# Patient Record
Sex: Female | Born: 1935 | Race: White | Hispanic: No | Smoking: Never smoker
Health system: Southern US, Community
[De-identification: ages and names within clinical notes are randomized; demographics above are authoritative.]

## PROBLEM LIST (undated history)

## (undated) ENCOUNTER — Emergency Department (HOSPITAL_COMMUNITY): Admission: EM | Payer: 59 | Source: Home / Self Care

## (undated) DIAGNOSIS — R55 Syncope and collapse: Secondary | ICD-10-CM

## (undated) DIAGNOSIS — E875 Hyperkalemia: Secondary | ICD-10-CM

## (undated) DIAGNOSIS — R296 Repeated falls: Secondary | ICD-10-CM

## (undated) DIAGNOSIS — R7989 Other specified abnormal findings of blood chemistry: Secondary | ICD-10-CM

## (undated) DIAGNOSIS — F0390 Unspecified dementia without behavioral disturbance: Secondary | ICD-10-CM

## (undated) DIAGNOSIS — E119 Type 2 diabetes mellitus without complications: Secondary | ICD-10-CM

## (undated) DIAGNOSIS — K219 Gastro-esophageal reflux disease without esophagitis: Secondary | ICD-10-CM

## (undated) DIAGNOSIS — F09 Unspecified mental disorder due to known physiological condition: Secondary | ICD-10-CM

## (undated) DIAGNOSIS — R262 Difficulty in walking, not elsewhere classified: Secondary | ICD-10-CM

## (undated) DIAGNOSIS — I498 Other specified cardiac arrhythmias: Secondary | ICD-10-CM

## (undated) DIAGNOSIS — N179 Acute kidney failure, unspecified: Secondary | ICD-10-CM

## (undated) DIAGNOSIS — R1312 Dysphagia, oropharyngeal phase: Secondary | ICD-10-CM

## (undated) DIAGNOSIS — G309 Alzheimer's disease, unspecified: Secondary | ICD-10-CM

## (undated) DIAGNOSIS — I499 Cardiac arrhythmia, unspecified: Secondary | ICD-10-CM

## (undated) DIAGNOSIS — N390 Urinary tract infection, site not specified: Secondary | ICD-10-CM

## (undated) DIAGNOSIS — F0280 Dementia in other diseases classified elsewhere without behavioral disturbance: Secondary | ICD-10-CM

## (undated) DIAGNOSIS — I10 Essential (primary) hypertension: Secondary | ICD-10-CM

## (undated) DIAGNOSIS — D649 Anemia, unspecified: Secondary | ICD-10-CM

## (undated) HISTORY — DX: Other specified cardiac arrhythmias: I49.8

## (undated) HISTORY — DX: Dysphagia, oropharyngeal phase: R13.12

## (undated) HISTORY — PX: JOINT REPLACEMENT: SHX530

## (undated) HISTORY — DX: Unspecified dementia, unspecified severity, without behavioral disturbance, psychotic disturbance, mood disturbance, and anxiety: F03.90

## (undated) HISTORY — PX: CHOLECYSTECTOMY: SHX55

## (undated) HISTORY — PX: CARDIAC SURGERY: SHX584

## (undated) HISTORY — PX: ABDOMINAL HYSTERECTOMY: SHX81

## (undated) HISTORY — PX: APPENDECTOMY: SHX54

## (undated) HISTORY — DX: Gastro-esophageal reflux disease without esophagitis: K21.9

---

## 2007-03-16 ENCOUNTER — Encounter (HOSPITAL_COMMUNITY): Admission: RE | Admit: 2007-03-16 | Discharge: 2007-04-15 | Payer: Self-pay | Admitting: Internal Medicine

## 2007-04-20 ENCOUNTER — Encounter (HOSPITAL_COMMUNITY): Admission: RE | Admit: 2007-04-20 | Discharge: 2007-05-20 | Payer: Self-pay | Admitting: Internal Medicine

## 2007-06-28 ENCOUNTER — Encounter (HOSPITAL_COMMUNITY): Admission: RE | Admit: 2007-06-28 | Discharge: 2007-07-30 | Payer: Self-pay | Admitting: Internal Medicine

## 2007-10-01 ENCOUNTER — Emergency Department (HOSPITAL_COMMUNITY): Admission: EM | Admit: 2007-10-01 | Discharge: 2007-10-01 | Payer: Self-pay | Admitting: Emergency Medicine

## 2007-10-15 ENCOUNTER — Emergency Department (HOSPITAL_COMMUNITY): Admission: EM | Admit: 2007-10-15 | Discharge: 2007-10-16 | Payer: Self-pay | Admitting: Emergency Medicine

## 2007-12-20 ENCOUNTER — Emergency Department (HOSPITAL_COMMUNITY): Admission: EM | Admit: 2007-12-20 | Discharge: 2007-12-20 | Payer: Self-pay | Admitting: Emergency Medicine

## 2008-03-26 ENCOUNTER — Emergency Department (HOSPITAL_COMMUNITY): Admission: EM | Admit: 2008-03-26 | Discharge: 2008-03-26 | Payer: Self-pay | Admitting: Emergency Medicine

## 2009-02-12 ENCOUNTER — Ambulatory Visit: Payer: Self-pay | Admitting: Cardiology

## 2009-02-12 ENCOUNTER — Ambulatory Visit: Payer: Self-pay | Admitting: Orthopedic Surgery

## 2009-02-12 ENCOUNTER — Inpatient Hospital Stay (HOSPITAL_COMMUNITY): Admission: EM | Admit: 2009-02-12 | Discharge: 2009-02-26 | Payer: Self-pay | Admitting: Emergency Medicine

## 2009-02-13 ENCOUNTER — Encounter: Payer: Self-pay | Admitting: Cardiology

## 2009-02-13 ENCOUNTER — Encounter: Payer: Self-pay | Admitting: Orthopedic Surgery

## 2009-02-20 ENCOUNTER — Encounter: Payer: Self-pay | Admitting: Orthopedic Surgery

## 2009-02-26 ENCOUNTER — Inpatient Hospital Stay: Admission: AD | Admit: 2009-02-26 | Discharge: 2009-11-29 | Payer: Self-pay | Admitting: Internal Medicine

## 2009-03-02 ENCOUNTER — Telehealth: Payer: Self-pay | Admitting: Orthopedic Surgery

## 2009-04-02 ENCOUNTER — Telehealth: Payer: Self-pay | Admitting: Orthopedic Surgery

## 2009-04-02 ENCOUNTER — Ambulatory Visit: Payer: Self-pay | Admitting: Orthopedic Surgery

## 2009-04-06 ENCOUNTER — Telehealth: Payer: Self-pay | Admitting: Orthopedic Surgery

## 2009-05-01 ENCOUNTER — Ambulatory Visit (HOSPITAL_COMMUNITY): Admission: RE | Admit: 2009-05-01 | Discharge: 2009-05-01 | Payer: Self-pay | Admitting: Internal Medicine

## 2009-05-07 ENCOUNTER — Encounter: Payer: Self-pay | Admitting: Orthopedic Surgery

## 2009-05-14 ENCOUNTER — Ambulatory Visit: Payer: Self-pay | Admitting: Orthopedic Surgery

## 2009-08-01 ENCOUNTER — Encounter: Payer: Self-pay | Admitting: Orthopedic Surgery

## 2009-10-25 ENCOUNTER — Ambulatory Visit (HOSPITAL_COMMUNITY): Admission: RE | Admit: 2009-10-25 | Discharge: 2009-10-25 | Payer: Self-pay | Admitting: Internal Medicine

## 2009-11-29 ENCOUNTER — Inpatient Hospital Stay (HOSPITAL_COMMUNITY): Admission: EM | Admit: 2009-11-29 | Discharge: 2009-12-01 | Payer: Self-pay | Admitting: Emergency Medicine

## 2009-12-01 ENCOUNTER — Inpatient Hospital Stay
Admission: AD | Admit: 2009-12-01 | Discharge: 2011-01-21 | Disposition: A | Discharge status: Nursing Home (Includes ICF) | Payer: Medicare HMO | Attending: Internal Medicine | Admitting: Internal Medicine

## 2010-05-08 ENCOUNTER — Ambulatory Visit (HOSPITAL_COMMUNITY): Admission: RE | Admit: 2010-05-08 | Discharge: 2010-05-08 | Payer: Self-pay | Admitting: Internal Medicine

## 2010-06-12 LAB — GLUCOSE, CAPILLARY
Glucose-Capillary: 163 mg/dL — ABNORMAL HIGH (ref 70–99)
Glucose-Capillary: 139 mg/dL — ABNORMAL HIGH (ref 70–99)

## 2010-06-14 LAB — GLUCOSE, CAPILLARY: Glucose-Capillary: 196 mg/dL — ABNORMAL HIGH (ref 70–99)

## 2010-06-22 ENCOUNTER — Ambulatory Visit
Admission: AD | Admit: 2010-06-22 | Discharge: 2010-06-22 | Payer: Self-pay | Source: Home / Self Care | Attending: Obstetrics & Gynecology | Admitting: Obstetrics & Gynecology

## 2010-06-24 LAB — GLUCOSE, CAPILLARY
Glucose-Capillary: 186 mg/dL — ABNORMAL HIGH (ref 70–99)
Glucose-Capillary: 173 mg/dL — ABNORMAL HIGH (ref 70–99)
Glucose-Capillary: 172 mg/dL — ABNORMAL HIGH (ref 70–99)
Glucose-Capillary: 185 mg/dL — ABNORMAL HIGH (ref 70–99)
Glucose-Capillary: 206 mg/dL — ABNORMAL HIGH (ref 70–99)
Glucose-Capillary: 156 mg/dL — ABNORMAL HIGH (ref 70–99)
Glucose-Capillary: 152 mg/dL — ABNORMAL HIGH (ref 70–99)
Glucose-Capillary: 168 mg/dL — ABNORMAL HIGH (ref 70–99)

## 2010-06-26 LAB — GLUCOSE, CAPILLARY
Glucose-Capillary: 153 mg/dL — ABNORMAL HIGH (ref 70–99)
Glucose-Capillary: 179 mg/dL — ABNORMAL HIGH (ref 70–99)

## 2010-07-01 LAB — GLUCOSE, CAPILLARY: Glucose-Capillary: 178 mg/dL — ABNORMAL HIGH (ref 70–99)

## 2010-07-02 LAB — GLUCOSE, CAPILLARY: Glucose-Capillary: 149 mg/dL — ABNORMAL HIGH (ref 70–99)

## 2010-07-03 LAB — GLUCOSE, CAPILLARY: Glucose-Capillary: 154 mg/dL — ABNORMAL HIGH (ref 70–99)

## 2010-07-09 NOTE — Letter (Signed)
Summary: Medical record request [employer]  Medical record request [employer]   Imported By: Cammie Sickle 09/29/2009 10:59:54  _____________________________________________________________________  External Attachment:    Type:   Image     Comment:   External Document

## 2010-07-10 LAB — GLUCOSE, CAPILLARY: Glucose-Capillary: 159 mg/dL — ABNORMAL HIGH (ref 70–99)

## 2010-07-12 LAB — GLUCOSE, CAPILLARY: Glucose-Capillary: 142 mg/dL — ABNORMAL HIGH (ref 70–99)

## 2010-07-13 LAB — GLUCOSE, CAPILLARY: Glucose-Capillary: 168 mg/dL — ABNORMAL HIGH (ref 70–99)

## 2010-07-15 LAB — GLUCOSE, CAPILLARY
Glucose-Capillary: 145 mg/dL — ABNORMAL HIGH (ref 70–99)
Glucose-Capillary: 160 mg/dL — ABNORMAL HIGH (ref 70–99)

## 2010-07-23 ENCOUNTER — Ambulatory Visit (HOSPITAL_COMMUNITY)
Admission: EM | Admit: 2010-07-23 | Discharge: 2010-07-23 | Disposition: A | Discharge status: Home / Self Care | Payer: Medicare HMO | Source: Skilled Nursing Facility | Attending: Emergency Medicine | Admitting: Emergency Medicine

## 2010-07-23 ENCOUNTER — Inpatient Hospital Stay (HOSPITAL_COMMUNITY): Payer: Medicare HMO

## 2010-07-23 ENCOUNTER — Emergency Department (HOSPITAL_COMMUNITY): Admission: EM | Admit: 2010-07-23 | Payer: Medicare HMO | Source: Home / Self Care

## 2010-07-23 DIAGNOSIS — M25559 Pain in unspecified hip: Secondary | ICD-10-CM | POA: Insufficient documentation

## 2010-07-23 DIAGNOSIS — Z79899 Other long term (current) drug therapy: Secondary | ICD-10-CM | POA: Insufficient documentation

## 2010-07-23 DIAGNOSIS — Z7982 Long term (current) use of aspirin: Secondary | ICD-10-CM | POA: Insufficient documentation

## 2010-07-23 DIAGNOSIS — M7989 Other specified soft tissue disorders: Secondary | ICD-10-CM | POA: Insufficient documentation

## 2010-07-23 DIAGNOSIS — I10 Essential (primary) hypertension: Secondary | ICD-10-CM | POA: Insufficient documentation

## 2010-07-23 DIAGNOSIS — E119 Type 2 diabetes mellitus without complications: Secondary | ICD-10-CM | POA: Insufficient documentation

## 2010-07-23 LAB — DIFFERENTIAL
Basophils Absolute: 0.1 10*3/uL (ref 0.0–0.1)
Basophils Relative: 1 % (ref 0–1)
Eosinophils Relative: 8 % — ABNORMAL HIGH (ref 0–5)
Lymphocytes Relative: 21 % (ref 12–46)
Lymphs Abs: 1.9 10*3/uL (ref 0.7–4.0)
Monocytes Absolute: 0.8 10*3/uL (ref 0.1–1.0)

## 2010-07-23 LAB — BASIC METABOLIC PANEL
CO2: 23 mEq/L (ref 19–32)
Chloride: 104 mEq/L (ref 96–112)
Glucose, Bld: 178 mg/dL — ABNORMAL HIGH (ref 70–99)
Potassium: 4 mEq/L (ref 3.5–5.1)
Sodium: 135 mEq/L (ref 135–145)

## 2010-07-23 LAB — SEDIMENTATION RATE: Sed Rate: 43 mm/hr — ABNORMAL HIGH (ref 0–22)

## 2010-07-23 LAB — GLUCOSE, CAPILLARY: Glucose-Capillary: 158 mg/dL — ABNORMAL HIGH (ref 70–99)

## 2010-07-23 LAB — CBC
MCH: 31 pg (ref 26.0–34.0)
Platelets: 242 10*3/uL (ref 150–400)
RBC: 3.55 MIL/uL — ABNORMAL LOW (ref 3.87–5.11)
WBC: 9.1 10*3/uL (ref 4.0–10.5)

## 2010-07-23 MED ORDER — IOHEXOL 300 MG/ML  SOLN
100.0000 mL | Freq: Once | INTRAMUSCULAR | Status: AC | PRN
  Administered 2010-07-23: 100 mL via INTRAVENOUS

## 2010-07-24 LAB — GLUCOSE, CAPILLARY: Glucose-Capillary: 140 mg/dL — ABNORMAL HIGH (ref 70–99)

## 2010-07-29 LAB — GLUCOSE, CAPILLARY: Glucose-Capillary: 175 mg/dL — ABNORMAL HIGH (ref 70–99)

## 2010-07-30 ENCOUNTER — Telehealth: Payer: Self-pay | Admitting: Orthopedic Surgery

## 2010-07-31 LAB — GLUCOSE, CAPILLARY: Glucose-Capillary: 161 mg/dL — ABNORMAL HIGH (ref 70–99)

## 2010-08-01 ENCOUNTER — Ambulatory Visit: Payer: Medicare HMO | Admitting: Orthopedic Surgery

## 2010-08-02 LAB — GLUCOSE, CAPILLARY: Glucose-Capillary: 135 mg/dL — ABNORMAL HIGH (ref 70–99)

## 2010-08-04 LAB — GLUCOSE, CAPILLARY: Glucose-Capillary: 156 mg/dL — ABNORMAL HIGH (ref 70–99)

## 2010-08-06 NOTE — Progress Notes (Signed)
Summary: cancelling appointment, question about next appointment  Phone Note Call from Patient   Caller: Daughter Summary of Call:     Patient's daughter [name] [phone], called to cancel patient's appointment for 08/01/10, due to a separate medical issue - ulcer at bottom of foot (patient is diabetic). [facility] also called to cancel.       [name], daughter, asking if Dr Romeo Apple could possibly meet patient at [facility] to see her hip and to possibly do a cortisone injection if recommended - once patient's treatment for her foot ulcer is completed.   Please advise. Initial call taken by: Cammie Sickle,  July 30, 2010 2:55 PM  Follow-up for Phone Call        no comment  Follow-up by: Fuller Canada MD,  July 30, 2010 2:57 PM  Additional Follow-up for Phone Call Additional follow up Details #1::        called back to daughter [name].  Advised to call after all current treatment for her foot problem is completed, and to try to arrange to re-schedule for the hip for here in the office if possible, Vs. hosp visit, for the hip problem. Additional Follow-up by: Cammie Sickle,  August 01, 2010 11:56 AM

## 2010-08-07 LAB — GLUCOSE, CAPILLARY
Glucose-Capillary: 134 mg/dL — ABNORMAL HIGH (ref 70–99)
Glucose-Capillary: 169 mg/dL — ABNORMAL HIGH (ref 70–99)

## 2010-08-09 LAB — GLUCOSE, CAPILLARY: Glucose-Capillary: 106 mg/dL — ABNORMAL HIGH (ref 70–99)

## 2010-08-12 LAB — GLUCOSE, CAPILLARY: Glucose-Capillary: 156 mg/dL — ABNORMAL HIGH (ref 70–99)

## 2010-08-19 LAB — GLUCOSE, CAPILLARY
Glucose-Capillary: 140 mg/dL — ABNORMAL HIGH (ref 70–99)
Glucose-Capillary: 152 mg/dL — ABNORMAL HIGH (ref 70–99)
Glucose-Capillary: 156 mg/dL — ABNORMAL HIGH (ref 70–99)
Glucose-Capillary: 157 mg/dL — ABNORMAL HIGH (ref 70–99)
Glucose-Capillary: 157 mg/dL — ABNORMAL HIGH (ref 70–99)
Glucose-Capillary: 168 mg/dL — ABNORMAL HIGH (ref 70–99)
Glucose-Capillary: 189 mg/dL — ABNORMAL HIGH (ref 70–99)
Glucose-Capillary: 198 mg/dL — ABNORMAL HIGH (ref 70–99)
Glucose-Capillary: 202 mg/dL — ABNORMAL HIGH (ref 70–99)

## 2010-08-20 LAB — GLUCOSE, CAPILLARY
Glucose-Capillary: 114 mg/dL — ABNORMAL HIGH (ref 70–99)
Glucose-Capillary: 126 mg/dL — ABNORMAL HIGH (ref 70–99)
Glucose-Capillary: 134 mg/dL — ABNORMAL HIGH (ref 70–99)
Glucose-Capillary: 139 mg/dL — ABNORMAL HIGH (ref 70–99)
Glucose-Capillary: 140 mg/dL — ABNORMAL HIGH (ref 70–99)
Glucose-Capillary: 141 mg/dL — ABNORMAL HIGH (ref 70–99)
Glucose-Capillary: 148 mg/dL — ABNORMAL HIGH (ref 70–99)
Glucose-Capillary: 156 mg/dL — ABNORMAL HIGH (ref 70–99)
Glucose-Capillary: 157 mg/dL — ABNORMAL HIGH (ref 70–99)
Glucose-Capillary: 164 mg/dL — ABNORMAL HIGH (ref 70–99)
Glucose-Capillary: 167 mg/dL — ABNORMAL HIGH (ref 70–99)
Glucose-Capillary: 171 mg/dL — ABNORMAL HIGH (ref 70–99)
Glucose-Capillary: 174 mg/dL — ABNORMAL HIGH (ref 70–99)
Glucose-Capillary: 180 mg/dL — ABNORMAL HIGH (ref 70–99)
Glucose-Capillary: 183 mg/dL — ABNORMAL HIGH (ref 70–99)
Glucose-Capillary: 187 mg/dL — ABNORMAL HIGH (ref 70–99)
Glucose-Capillary: 193 mg/dL — ABNORMAL HIGH (ref 70–99)
Glucose-Capillary: 198 mg/dL — ABNORMAL HIGH (ref 70–99)
Glucose-Capillary: 203 mg/dL — ABNORMAL HIGH (ref 70–99)
Glucose-Capillary: 244 mg/dL — ABNORMAL HIGH (ref 70–99)

## 2010-08-21 LAB — GLUCOSE, CAPILLARY
Glucose-Capillary: 171 mg/dL — ABNORMAL HIGH (ref 70–99)
Glucose-Capillary: 141 mg/dL — ABNORMAL HIGH (ref 70–99)
Glucose-Capillary: 170 mg/dL — ABNORMAL HIGH (ref 70–99)
Glucose-Capillary: 184 mg/dL — ABNORMAL HIGH (ref 70–99)
Glucose-Capillary: 185 mg/dL — ABNORMAL HIGH (ref 70–99)
Glucose-Capillary: 186 mg/dL — ABNORMAL HIGH (ref 70–99)
Glucose-Capillary: 186 mg/dL — ABNORMAL HIGH (ref 70–99)
Glucose-Capillary: 117 mg/dL — ABNORMAL HIGH (ref 70–99)

## 2010-08-22 LAB — GLUCOSE, CAPILLARY
Glucose-Capillary: 142 mg/dL — ABNORMAL HIGH (ref 70–99)
Glucose-Capillary: 154 mg/dL — ABNORMAL HIGH (ref 70–99)
Glucose-Capillary: 169 mg/dL — ABNORMAL HIGH (ref 70–99)
Glucose-Capillary: 180 mg/dL — ABNORMAL HIGH (ref 70–99)
Glucose-Capillary: 198 mg/dL — ABNORMAL HIGH (ref 70–99)
Glucose-Capillary: 293 mg/dL — ABNORMAL HIGH (ref 70–99)
Glucose-Capillary: 146 mg/dL — ABNORMAL HIGH (ref 70–99)
Glucose-Capillary: 156 mg/dL — ABNORMAL HIGH (ref 70–99)
Glucose-Capillary: 169 mg/dL — ABNORMAL HIGH (ref 70–99)
Glucose-Capillary: 170 mg/dL — ABNORMAL HIGH (ref 70–99)
Glucose-Capillary: 173 mg/dL — ABNORMAL HIGH (ref 70–99)
Glucose-Capillary: 176 mg/dL — ABNORMAL HIGH (ref 70–99)
Glucose-Capillary: 179 mg/dL — ABNORMAL HIGH (ref 70–99)
Glucose-Capillary: 128 mg/dL — ABNORMAL HIGH (ref 70–99)
Glucose-Capillary: 134 mg/dL — ABNORMAL HIGH (ref 70–99)
Glucose-Capillary: 137 mg/dL — ABNORMAL HIGH (ref 70–99)
Glucose-Capillary: 144 mg/dL — ABNORMAL HIGH (ref 70–99)
Glucose-Capillary: 146 mg/dL — ABNORMAL HIGH (ref 70–99)
Glucose-Capillary: 146 mg/dL — ABNORMAL HIGH (ref 70–99)
Glucose-Capillary: 157 mg/dL — ABNORMAL HIGH (ref 70–99)
Glucose-Capillary: 173 mg/dL — ABNORMAL HIGH (ref 70–99)

## 2010-08-23 LAB — GLUCOSE, CAPILLARY
Glucose-Capillary: 130 mg/dL — ABNORMAL HIGH (ref 70–99)
Glucose-Capillary: 131 mg/dL — ABNORMAL HIGH (ref 70–99)
Glucose-Capillary: 149 mg/dL — ABNORMAL HIGH (ref 70–99)
Glucose-Capillary: 167 mg/dL — ABNORMAL HIGH (ref 70–99)
Glucose-Capillary: 169 mg/dL — ABNORMAL HIGH (ref 70–99)
Glucose-Capillary: 186 mg/dL — ABNORMAL HIGH (ref 70–99)
Glucose-Capillary: 163 mg/dL — ABNORMAL HIGH (ref 70–99)
Glucose-Capillary: 126 mg/dL — ABNORMAL HIGH (ref 70–99)
Glucose-Capillary: 134 mg/dL — ABNORMAL HIGH (ref 70–99)
Glucose-Capillary: 140 mg/dL — ABNORMAL HIGH (ref 70–99)
Glucose-Capillary: 144 mg/dL — ABNORMAL HIGH (ref 70–99)
Glucose-Capillary: 159 mg/dL — ABNORMAL HIGH (ref 70–99)
Glucose-Capillary: 166 mg/dL — ABNORMAL HIGH (ref 70–99)

## 2010-08-24 LAB — GLUCOSE, CAPILLARY
Glucose-Capillary: 134 mg/dL — ABNORMAL HIGH (ref 70–99)
Glucose-Capillary: 147 mg/dL — ABNORMAL HIGH (ref 70–99)
Glucose-Capillary: 154 mg/dL — ABNORMAL HIGH (ref 70–99)
Glucose-Capillary: 155 mg/dL — ABNORMAL HIGH (ref 70–99)
Glucose-Capillary: 163 mg/dL — ABNORMAL HIGH (ref 70–99)
Glucose-Capillary: 167 mg/dL — ABNORMAL HIGH (ref 70–99)
Glucose-Capillary: 178 mg/dL — ABNORMAL HIGH (ref 70–99)
Glucose-Capillary: 91 mg/dL (ref 70–99)

## 2010-08-25 LAB — GLUCOSE, CAPILLARY
Glucose-Capillary: 117 mg/dL — ABNORMAL HIGH (ref 70–99)
Glucose-Capillary: 127 mg/dL — ABNORMAL HIGH (ref 70–99)
Glucose-Capillary: 127 mg/dL — ABNORMAL HIGH (ref 70–99)
Glucose-Capillary: 132 mg/dL — ABNORMAL HIGH (ref 70–99)
Glucose-Capillary: 134 mg/dL — ABNORMAL HIGH (ref 70–99)
Glucose-Capillary: 159 mg/dL — ABNORMAL HIGH (ref 70–99)
Glucose-Capillary: 183 mg/dL — ABNORMAL HIGH (ref 70–99)
Glucose-Capillary: 217 mg/dL — ABNORMAL HIGH (ref 70–99)
Glucose-Capillary: 229 mg/dL — ABNORMAL HIGH (ref 70–99)
Glucose-Capillary: 94 mg/dL (ref 70–99)
Glucose-Capillary: 140 mg/dL — ABNORMAL HIGH (ref 70–99)
Glucose-Capillary: 142 mg/dL — ABNORMAL HIGH (ref 70–99)
Glucose-Capillary: 160 mg/dL — ABNORMAL HIGH (ref 70–99)
Glucose-Capillary: 162 mg/dL — ABNORMAL HIGH (ref 70–99)
Glucose-Capillary: 162 mg/dL — ABNORMAL HIGH (ref 70–99)
Glucose-Capillary: 167 mg/dL — ABNORMAL HIGH (ref 70–99)
Glucose-Capillary: 169 mg/dL — ABNORMAL HIGH (ref 70–99)
Glucose-Capillary: 179 mg/dL — ABNORMAL HIGH (ref 70–99)
Glucose-Capillary: 135 mg/dL — ABNORMAL HIGH (ref 70–99)
Glucose-Capillary: 148 mg/dL — ABNORMAL HIGH (ref 70–99)
Glucose-Capillary: 149 mg/dL — ABNORMAL HIGH (ref 70–99)
Glucose-Capillary: 152 mg/dL — ABNORMAL HIGH (ref 70–99)
Glucose-Capillary: 153 mg/dL — ABNORMAL HIGH (ref 70–99)
Glucose-Capillary: 155 mg/dL — ABNORMAL HIGH (ref 70–99)
Glucose-Capillary: 158 mg/dL — ABNORMAL HIGH (ref 70–99)
Glucose-Capillary: 163 mg/dL — ABNORMAL HIGH (ref 70–99)
Glucose-Capillary: 167 mg/dL — ABNORMAL HIGH (ref 70–99)
Glucose-Capillary: 114 mg/dL — ABNORMAL HIGH (ref 70–99)
Glucose-Capillary: 120 mg/dL — ABNORMAL HIGH (ref 70–99)
Glucose-Capillary: 125 mg/dL — ABNORMAL HIGH (ref 70–99)
Glucose-Capillary: 129 mg/dL — ABNORMAL HIGH (ref 70–99)
Glucose-Capillary: 138 mg/dL — ABNORMAL HIGH (ref 70–99)
Glucose-Capillary: 138 mg/dL — ABNORMAL HIGH (ref 70–99)
Glucose-Capillary: 148 mg/dL — ABNORMAL HIGH (ref 70–99)
Glucose-Capillary: 150 mg/dL — ABNORMAL HIGH (ref 70–99)
Glucose-Capillary: 159 mg/dL — ABNORMAL HIGH (ref 70–99)
Glucose-Capillary: 160 mg/dL — ABNORMAL HIGH (ref 70–99)
Glucose-Capillary: 164 mg/dL — ABNORMAL HIGH (ref 70–99)
Glucose-Capillary: 170 mg/dL — ABNORMAL HIGH (ref 70–99)
Glucose-Capillary: 172 mg/dL — ABNORMAL HIGH (ref 70–99)
Glucose-Capillary: 178 mg/dL — ABNORMAL HIGH (ref 70–99)
Glucose-Capillary: 179 mg/dL — ABNORMAL HIGH (ref 70–99)
Glucose-Capillary: 189 mg/dL — ABNORMAL HIGH (ref 70–99)
Glucose-Capillary: 207 mg/dL — ABNORMAL HIGH (ref 70–99)
Glucose-Capillary: 208 mg/dL — ABNORMAL HIGH (ref 70–99)
Glucose-Capillary: 218 mg/dL — ABNORMAL HIGH (ref 70–99)
Glucose-Capillary: 221 mg/dL — ABNORMAL HIGH (ref 70–99)
Glucose-Capillary: 243 mg/dL — ABNORMAL HIGH (ref 70–99)
Glucose-Capillary: 254 mg/dL — ABNORMAL HIGH (ref 70–99)

## 2010-08-25 LAB — URINALYSIS, ROUTINE W REFLEX MICROSCOPIC
Bilirubin Urine: NEGATIVE
Glucose, UA: NEGATIVE mg/dL
Hgb urine dipstick: NEGATIVE
Ketones, ur: NEGATIVE mg/dL
Nitrite: NEGATIVE
Protein, ur: NEGATIVE mg/dL
Specific Gravity, Urine: >1.03 — ABNORMAL HIGH (ref 1.005–1.030)
Urobilinogen, UA: 0.2 mg/dL (ref 0.0–1.0)
pH: 5 (ref 5.0–8.0)

## 2010-08-25 LAB — URINE CULTURE
Colony Count: NO GROWTH
Culture: NO GROWTH
Special Requests: NEGATIVE

## 2010-08-25 LAB — COMPREHENSIVE METABOLIC PANEL
BUN: 35 mg/dL — ABNORMAL HIGH (ref 6–23)
CO2: 29 mEq/L (ref 19–32)
Calcium: 9.1 mg/dL (ref 8.4–10.5)
Chloride: 100 mEq/L (ref 96–112)
Creatinine, Ser: 2.25 mg/dL — ABNORMAL HIGH (ref 0.4–1.2)
GFR calc non Af Amer: 21 mL/min — ABNORMAL LOW (ref >60)
Glucose, Bld: 152 mg/dL — ABNORMAL HIGH (ref 70–99)
Total Bilirubin: 0.3 mg/dL (ref 0.3–1.2)

## 2010-08-25 LAB — CBC
HCT: 30.8 % — ABNORMAL LOW (ref 36.0–46.0)
HCT: 33.9 % — ABNORMAL LOW (ref 36.0–46.0)
Hemoglobin: 11.5 g/dL — ABNORMAL LOW (ref 12.0–15.0)
MCH: 31.4 pg (ref 26.0–34.0)
MCHC: 34 g/dL (ref 30.0–36.0)
MCHC: 34.2 g/dL (ref 30.0–36.0)
MCV: 91.3 fL (ref 78.0–100.0)
MCV: 92.3 fL (ref 78.0–100.0)
Platelets: 175 10*3/uL (ref 150–400)
RBC: 3.68 MIL/uL — ABNORMAL LOW (ref 3.87–5.11)
RDW: 13.4 % (ref 11.5–15.5)

## 2010-08-25 LAB — DIFFERENTIAL
Basophils Absolute: 0.1 10*3/uL (ref 0.0–0.1)
Basophils Absolute: 0.1 10*3/uL (ref 0.0–0.1)
Basophils Relative: 1 % (ref 0–1)
Eosinophils Absolute: 0.6 10*3/uL (ref 0.0–0.7)
Eosinophils Relative: 10 % — ABNORMAL HIGH (ref 0–5)
Eosinophils Relative: 7 % — ABNORMAL HIGH (ref 0–5)
Lymphocytes Relative: 19 % (ref 12–46)
Lymphs Abs: 1.8 10*3/uL (ref 0.7–4.0)
Monocytes Absolute: 0.6 10*3/uL (ref 0.1–1.0)
Neutro Abs: 3.8 10*3/uL (ref 1.7–7.7)
Neutrophils Relative %: 62 % (ref 43–77)

## 2010-08-25 LAB — CARDIAC PANEL(CRET KIN+CKTOT+MB+TROPI)
CK, MB: 1.3 ng/mL (ref 0.3–4.0)
Relative Index: INVALID (ref 0.0–2.5)
Total CK: 44 U/L (ref 7–177)
Total CK: 45 U/L (ref 7–177)
Troponin I: 0.03 ng/mL (ref 0.00–0.06)

## 2010-08-25 LAB — URINE MICROSCOPIC-ADD ON

## 2010-08-25 LAB — CULTURE, BLOOD (ROUTINE X 2)
Culture: NO GROWTH
Culture: NO GROWTH
Report Status: 6282011
Report Status: 6282011

## 2010-08-25 LAB — BASIC METABOLIC PANEL
BUN: 16 mg/dL (ref 6–23)
CO2: 26 mEq/L (ref 19–32)
Chloride: 104 mEq/L (ref 96–112)
Creatinine, Ser: 1.05 mg/dL (ref 0.4–1.2)
GFR calc Af Amer: 34 mL/min — ABNORMAL LOW (ref >60)
GFR calc non Af Amer: 50 mL/min — ABNORMAL LOW (ref >60)
Glucose, Bld: 105 mg/dL — ABNORMAL HIGH (ref 70–99)
Glucose, Bld: 154 mg/dL — ABNORMAL HIGH (ref 70–99)
Potassium: 4.4 mEq/L (ref 3.5–5.1)
Potassium: 4.8 mEq/L (ref 3.5–5.1)
Sodium: 136 mEq/L (ref 135–145)

## 2010-08-25 LAB — POCT CARDIAC MARKERS
Myoglobin, poc: 164 ng/mL (ref 12–200)
Troponin i, poc: <0.05 ng/mL (ref 0.00–0.09)

## 2010-08-26 LAB — GLUCOSE, CAPILLARY
Glucose-Capillary: 140 mg/dL — ABNORMAL HIGH (ref 70–99)
Glucose-Capillary: 159 mg/dL — ABNORMAL HIGH (ref 70–99)
Glucose-Capillary: 161 mg/dL — ABNORMAL HIGH (ref 70–99)
Glucose-Capillary: 164 mg/dL — ABNORMAL HIGH (ref 70–99)
Glucose-Capillary: 167 mg/dL — ABNORMAL HIGH (ref 70–99)
Glucose-Capillary: 167 mg/dL — ABNORMAL HIGH (ref 70–99)
Glucose-Capillary: 173 mg/dL — ABNORMAL HIGH (ref 70–99)
Glucose-Capillary: 174 mg/dL — ABNORMAL HIGH (ref 70–99)
Glucose-Capillary: 175 mg/dL — ABNORMAL HIGH (ref 70–99)
Glucose-Capillary: 181 mg/dL — ABNORMAL HIGH (ref 70–99)
Glucose-Capillary: 193 mg/dL — ABNORMAL HIGH (ref 70–99)
Glucose-Capillary: 195 mg/dL — ABNORMAL HIGH (ref 70–99)
Glucose-Capillary: 221 mg/dL — ABNORMAL HIGH (ref 70–99)
Glucose-Capillary: 237 mg/dL — ABNORMAL HIGH (ref 70–99)
Glucose-Capillary: 178 mg/dL — ABNORMAL HIGH (ref 70–99)
Glucose-Capillary: 111 mg/dL — ABNORMAL HIGH (ref 70–99)
Glucose-Capillary: 126 mg/dL — ABNORMAL HIGH (ref 70–99)
Glucose-Capillary: 139 mg/dL — ABNORMAL HIGH (ref 70–99)
Glucose-Capillary: 139 mg/dL — ABNORMAL HIGH (ref 70–99)
Glucose-Capillary: 150 mg/dL — ABNORMAL HIGH (ref 70–99)
Glucose-Capillary: 158 mg/dL — ABNORMAL HIGH (ref 70–99)
Glucose-Capillary: 159 mg/dL — ABNORMAL HIGH (ref 70–99)
Glucose-Capillary: 161 mg/dL — ABNORMAL HIGH (ref 70–99)
Glucose-Capillary: 161 mg/dL — ABNORMAL HIGH (ref 70–99)
Glucose-Capillary: 163 mg/dL — ABNORMAL HIGH (ref 70–99)
Glucose-Capillary: 164 mg/dL — ABNORMAL HIGH (ref 70–99)
Glucose-Capillary: 167 mg/dL — ABNORMAL HIGH (ref 70–99)
Glucose-Capillary: 168 mg/dL — ABNORMAL HIGH (ref 70–99)
Glucose-Capillary: 171 mg/dL — ABNORMAL HIGH (ref 70–99)
Glucose-Capillary: 175 mg/dL — ABNORMAL HIGH (ref 70–99)
Glucose-Capillary: 178 mg/dL — ABNORMAL HIGH (ref 70–99)
Glucose-Capillary: 181 mg/dL — ABNORMAL HIGH (ref 70–99)
Glucose-Capillary: 192 mg/dL — ABNORMAL HIGH (ref 70–99)
Glucose-Capillary: 203 mg/dL — ABNORMAL HIGH (ref 70–99)
Glucose-Capillary: 221 mg/dL — ABNORMAL HIGH (ref 70–99)
Glucose-Capillary: 238 mg/dL — ABNORMAL HIGH (ref 70–99)

## 2010-08-27 LAB — GLUCOSE, CAPILLARY
Glucose-Capillary: 148 mg/dL — ABNORMAL HIGH (ref 70–99)
Glucose-Capillary: 150 mg/dL — ABNORMAL HIGH (ref 70–99)
Glucose-Capillary: 159 mg/dL — ABNORMAL HIGH (ref 70–99)
Glucose-Capillary: 173 mg/dL — ABNORMAL HIGH (ref 70–99)
Glucose-Capillary: 176 mg/dL — ABNORMAL HIGH (ref 70–99)
Glucose-Capillary: 201 mg/dL — ABNORMAL HIGH (ref 70–99)
Glucose-Capillary: 202 mg/dL — ABNORMAL HIGH (ref 70–99)
Glucose-Capillary: 207 mg/dL — ABNORMAL HIGH (ref 70–99)
Glucose-Capillary: 212 mg/dL — ABNORMAL HIGH (ref 70–99)
Glucose-Capillary: 223 mg/dL — ABNORMAL HIGH (ref 70–99)
Glucose-Capillary: 227 mg/dL — ABNORMAL HIGH (ref 70–99)
Glucose-Capillary: 238 mg/dL — ABNORMAL HIGH (ref 70–99)
Glucose-Capillary: 240 mg/dL — ABNORMAL HIGH (ref 70–99)
Glucose-Capillary: 146 mg/dL — ABNORMAL HIGH (ref 70–99)
Glucose-Capillary: 148 mg/dL — ABNORMAL HIGH (ref 70–99)
Glucose-Capillary: 157 mg/dL — ABNORMAL HIGH (ref 70–99)
Glucose-Capillary: 183 mg/dL — ABNORMAL HIGH (ref 70–99)

## 2010-08-28 LAB — GLUCOSE, CAPILLARY
Glucose-Capillary: 174 mg/dL — ABNORMAL HIGH (ref 70–99)
Glucose-Capillary: 135 mg/dL — ABNORMAL HIGH (ref 70–99)
Glucose-Capillary: 140 mg/dL — ABNORMAL HIGH (ref 70–99)
Glucose-Capillary: 162 mg/dL — ABNORMAL HIGH (ref 70–99)
Glucose-Capillary: 210 mg/dL — ABNORMAL HIGH (ref 70–99)
Glucose-Capillary: 126 mg/dL — ABNORMAL HIGH (ref 70–99)
Glucose-Capillary: 126 mg/dL — ABNORMAL HIGH (ref 70–99)
Glucose-Capillary: 131 mg/dL — ABNORMAL HIGH (ref 70–99)
Glucose-Capillary: 136 mg/dL — ABNORMAL HIGH (ref 70–99)
Glucose-Capillary: 141 mg/dL — ABNORMAL HIGH (ref 70–99)
Glucose-Capillary: 155 mg/dL — ABNORMAL HIGH (ref 70–99)
Glucose-Capillary: 156 mg/dL — ABNORMAL HIGH (ref 70–99)
Glucose-Capillary: 160 mg/dL — ABNORMAL HIGH (ref 70–99)
Glucose-Capillary: 164 mg/dL — ABNORMAL HIGH (ref 70–99)
Glucose-Capillary: 167 mg/dL — ABNORMAL HIGH (ref 70–99)
Glucose-Capillary: 171 mg/dL — ABNORMAL HIGH (ref 70–99)
Glucose-Capillary: 173 mg/dL — ABNORMAL HIGH (ref 70–99)
Glucose-Capillary: 174 mg/dL — ABNORMAL HIGH (ref 70–99)
Glucose-Capillary: 179 mg/dL — ABNORMAL HIGH (ref 70–99)

## 2010-08-30 LAB — GLUCOSE, CAPILLARY
Glucose-Capillary: 165 mg/dL — ABNORMAL HIGH (ref 70–99)
Glucose-Capillary: 143 mg/dL — ABNORMAL HIGH (ref 70–99)

## 2010-09-01 LAB — GLUCOSE, CAPILLARY: Glucose-Capillary: 154 mg/dL — ABNORMAL HIGH (ref 70–99)

## 2010-09-02 LAB — GLUCOSE, CAPILLARY
Glucose-Capillary: 130 mg/dL — ABNORMAL HIGH (ref 70–99)
Glucose-Capillary: 133 mg/dL — ABNORMAL HIGH (ref 70–99)
Glucose-Capillary: 137 mg/dL — ABNORMAL HIGH (ref 70–99)
Glucose-Capillary: 143 mg/dL — ABNORMAL HIGH (ref 70–99)
Glucose-Capillary: 146 mg/dL — ABNORMAL HIGH (ref 70–99)
Glucose-Capillary: 146 mg/dL — ABNORMAL HIGH (ref 70–99)
Glucose-Capillary: 147 mg/dL — ABNORMAL HIGH (ref 70–99)
Glucose-Capillary: 147 mg/dL — ABNORMAL HIGH (ref 70–99)
Glucose-Capillary: 158 mg/dL — ABNORMAL HIGH (ref 70–99)
Glucose-Capillary: 165 mg/dL — ABNORMAL HIGH (ref 70–99)
Glucose-Capillary: 166 mg/dL — ABNORMAL HIGH (ref 70–99)
Glucose-Capillary: 167 mg/dL — ABNORMAL HIGH (ref 70–99)

## 2010-09-04 LAB — GLUCOSE, CAPILLARY: Glucose-Capillary: 155 mg/dL — ABNORMAL HIGH (ref 70–99)

## 2010-09-06 LAB — GLUCOSE, CAPILLARY
Glucose-Capillary: 158 mg/dL — ABNORMAL HIGH (ref 70–99)
Glucose-Capillary: 180 mg/dL — ABNORMAL HIGH (ref 70–99)

## 2010-09-09 LAB — GLUCOSE, CAPILLARY
Glucose-Capillary: 156 mg/dL — ABNORMAL HIGH (ref 70–99)
Glucose-Capillary: 156 mg/dL — ABNORMAL HIGH (ref 70–99)
Glucose-Capillary: 156 mg/dL — ABNORMAL HIGH (ref 70–99)
Glucose-Capillary: 157 mg/dL — ABNORMAL HIGH (ref 70–99)
Glucose-Capillary: 158 mg/dL — ABNORMAL HIGH (ref 70–99)
Glucose-Capillary: 160 mg/dL — ABNORMAL HIGH (ref 70–99)
Glucose-Capillary: 162 mg/dL — ABNORMAL HIGH (ref 70–99)
Glucose-Capillary: 167 mg/dL — ABNORMAL HIGH (ref 70–99)
Glucose-Capillary: 170 mg/dL — ABNORMAL HIGH (ref 70–99)
Glucose-Capillary: 174 mg/dL — ABNORMAL HIGH (ref 70–99)

## 2010-09-10 LAB — GLUCOSE, CAPILLARY
Glucose-Capillary: 150 mg/dL — ABNORMAL HIGH (ref 70–99)
Glucose-Capillary: 155 mg/dL — ABNORMAL HIGH (ref 70–99)
Glucose-Capillary: 157 mg/dL — ABNORMAL HIGH (ref 70–99)
Glucose-Capillary: 158 mg/dL — ABNORMAL HIGH (ref 70–99)
Glucose-Capillary: 158 mg/dL — ABNORMAL HIGH (ref 70–99)
Glucose-Capillary: 159 mg/dL — ABNORMAL HIGH (ref 70–99)
Glucose-Capillary: 165 mg/dL — ABNORMAL HIGH (ref 70–99)
Glucose-Capillary: 172 mg/dL — ABNORMAL HIGH (ref 70–99)

## 2010-09-11 LAB — GLUCOSE, CAPILLARY
Glucose-Capillary: 128 mg/dL — ABNORMAL HIGH (ref 70–99)
Glucose-Capillary: 149 mg/dL — ABNORMAL HIGH (ref 70–99)
Glucose-Capillary: 154 mg/dL — ABNORMAL HIGH (ref 70–99)
Glucose-Capillary: 157 mg/dL — ABNORMAL HIGH (ref 70–99)
Glucose-Capillary: 165 mg/dL — ABNORMAL HIGH (ref 70–99)
Glucose-Capillary: 148 mg/dL — ABNORMAL HIGH (ref 70–99)
Glucose-Capillary: 141 mg/dL — ABNORMAL HIGH (ref 70–99)
Glucose-Capillary: 146 mg/dL — ABNORMAL HIGH (ref 70–99)
Glucose-Capillary: 152 mg/dL — ABNORMAL HIGH (ref 70–99)
Glucose-Capillary: 154 mg/dL — ABNORMAL HIGH (ref 70–99)
Glucose-Capillary: 160 mg/dL — ABNORMAL HIGH (ref 70–99)
Glucose-Capillary: 166 mg/dL — ABNORMAL HIGH (ref 70–99)
Glucose-Capillary: 174 mg/dL — ABNORMAL HIGH (ref 70–99)
Glucose-Capillary: 178 mg/dL — ABNORMAL HIGH (ref 70–99)
Glucose-Capillary: 185 mg/dL — ABNORMAL HIGH (ref 70–99)

## 2010-09-12 LAB — GLUCOSE, CAPILLARY
Glucose-Capillary: 109 mg/dL — ABNORMAL HIGH (ref 70–99)
Glucose-Capillary: 145 mg/dL — ABNORMAL HIGH (ref 70–99)
Glucose-Capillary: 148 mg/dL — ABNORMAL HIGH (ref 70–99)
Glucose-Capillary: 154 mg/dL — ABNORMAL HIGH (ref 70–99)
Glucose-Capillary: 155 mg/dL — ABNORMAL HIGH (ref 70–99)
Glucose-Capillary: 156 mg/dL — ABNORMAL HIGH (ref 70–99)
Glucose-Capillary: 162 mg/dL — ABNORMAL HIGH (ref 70–99)
Glucose-Capillary: 166 mg/dL — ABNORMAL HIGH (ref 70–99)
Glucose-Capillary: 170 mg/dL — ABNORMAL HIGH (ref 70–99)
Glucose-Capillary: 182 mg/dL — ABNORMAL HIGH (ref 70–99)
Glucose-Capillary: 183 mg/dL — ABNORMAL HIGH (ref 70–99)
Glucose-Capillary: 202 mg/dL — ABNORMAL HIGH (ref 70–99)
Glucose-Capillary: 230 mg/dL — ABNORMAL HIGH (ref 70–99)
Glucose-Capillary: 266 mg/dL — ABNORMAL HIGH (ref 70–99)
Glucose-Capillary: 135 mg/dL — ABNORMAL HIGH (ref 70–99)
Glucose-Capillary: 136 mg/dL — ABNORMAL HIGH (ref 70–99)
Glucose-Capillary: 141 mg/dL — ABNORMAL HIGH (ref 70–99)
Glucose-Capillary: 143 mg/dL — ABNORMAL HIGH (ref 70–99)
Glucose-Capillary: 146 mg/dL — ABNORMAL HIGH (ref 70–99)
Glucose-Capillary: 149 mg/dL — ABNORMAL HIGH (ref 70–99)
Glucose-Capillary: 163 mg/dL — ABNORMAL HIGH (ref 70–99)
Glucose-Capillary: 167 mg/dL — ABNORMAL HIGH (ref 70–99)
Glucose-Capillary: 168 mg/dL — ABNORMAL HIGH (ref 70–99)
Glucose-Capillary: 171 mg/dL — ABNORMAL HIGH (ref 70–99)
Glucose-Capillary: 188 mg/dL — ABNORMAL HIGH (ref 70–99)
Glucose-Capillary: 188 mg/dL — ABNORMAL HIGH (ref 70–99)
Glucose-Capillary: 191 mg/dL — ABNORMAL HIGH (ref 70–99)
Glucose-Capillary: 217 mg/dL — ABNORMAL HIGH (ref 70–99)

## 2010-09-13 LAB — DIFFERENTIAL
Basophils Absolute: 0 10*3/uL (ref 0.0–0.1)
Basophils Absolute: 0 10*3/uL (ref 0.0–0.1)
Basophils Absolute: 0 10*3/uL (ref 0.0–0.1)
Basophils Relative: 0 % (ref 0–1)
Basophils Relative: 0 % (ref 0–1)
Basophils Relative: 1 % (ref 0–1)
Eosinophils Absolute: 0 10*3/uL (ref 0.0–0.7)
Eosinophils Absolute: 0.3 10*3/uL (ref 0.0–0.7)
Eosinophils Absolute: 0.5 10*3/uL (ref 0.0–0.7)
Eosinophils Absolute: 0.6 10*3/uL (ref 0.0–0.7)
Eosinophils Absolute: 0.7 10*3/uL (ref 0.0–0.7)
Eosinophils Absolute: 0.9 10*3/uL — ABNORMAL HIGH (ref 0.0–0.7)
Eosinophils Relative: 2 % (ref 0–5)
Eosinophils Relative: 6 % — ABNORMAL HIGH (ref 0–5)
Eosinophils Relative: 6 % — ABNORMAL HIGH (ref 0–5)
Eosinophils Relative: 9 % — ABNORMAL HIGH (ref 0–5)
Lymphocytes Relative: 14 % (ref 12–46)
Lymphocytes Relative: 9 % — ABNORMAL LOW (ref 12–46)
Lymphocytes Relative: 9 % — ABNORMAL LOW (ref 12–46)
Lymphs Abs: 0.8 10*3/uL (ref 0.7–4.0)
Lymphs Abs: 1 10*3/uL (ref 0.7–4.0)
Lymphs Abs: 1 10*3/uL (ref 0.7–4.0)
Lymphs Abs: 1.2 10*3/uL (ref 0.7–4.0)
Lymphs Abs: 1.4 10*3/uL (ref 0.7–4.0)
Monocytes Absolute: 0.7 10*3/uL (ref 0.1–1.0)
Monocytes Absolute: 1 10*3/uL (ref 0.1–1.0)
Monocytes Absolute: 1 10*3/uL (ref 0.1–1.0)
Monocytes Absolute: 1.1 10*3/uL — ABNORMAL HIGH (ref 0.1–1.0)
Monocytes Relative: 10 % (ref 3–12)
Monocytes Relative: 11 % (ref 3–12)
Monocytes Relative: 12 % (ref 3–12)
Monocytes Relative: 12 % (ref 3–12)
Monocytes Relative: 7 % (ref 3–12)
Monocytes Relative: 9 % (ref 3–12)
Neutro Abs: 7.3 10*3/uL (ref 1.7–7.7)
Neutro Abs: 8.9 10*3/uL — ABNORMAL HIGH (ref 1.7–7.7)
Neutrophils Relative %: 66 % (ref 43–77)
Neutrophils Relative %: 74 % (ref 43–77)
Neutrophils Relative %: 77 % (ref 43–77)
Neutrophils Relative %: 83 % — ABNORMAL HIGH (ref 43–77)
Neutrophils Relative %: 83 % — ABNORMAL HIGH (ref 43–77)

## 2010-09-13 LAB — BASIC METABOLIC PANEL
BUN: 17 mg/dL (ref 6–23)
BUN: 21 mg/dL (ref 6–23)
BUN: 21 mg/dL (ref 6–23)
BUN: 22 mg/dL (ref 6–23)
BUN: 9 mg/dL (ref 6–23)
CO2: 25 mEq/L (ref 19–32)
CO2: 26 mEq/L (ref 19–32)
CO2: 28 mEq/L (ref 19–32)
Calcium: 8.1 mg/dL — ABNORMAL LOW (ref 8.4–10.5)
Calcium: 8.3 mg/dL — ABNORMAL LOW (ref 8.4–10.5)
Calcium: 8.4 mg/dL (ref 8.4–10.5)
Chloride: 102 mEq/L (ref 96–112)
Chloride: 102 mEq/L (ref 96–112)
Chloride: 98 mEq/L (ref 96–112)
Chloride: 98 mEq/L (ref 96–112)
Chloride: 98 mEq/L (ref 96–112)
Creatinine, Ser: 0.7 mg/dL (ref 0.4–1.2)
Creatinine, Ser: 0.74 mg/dL (ref 0.4–1.2)
Creatinine, Ser: 0.75 mg/dL (ref 0.4–1.2)
Creatinine, Ser: 0.76 mg/dL (ref 0.4–1.2)
Creatinine, Ser: 0.79 mg/dL (ref 0.4–1.2)
GFR calc Af Amer: >60 mL/min (ref >60)
GFR calc Af Amer: >60 mL/min (ref >60)
GFR calc Af Amer: >60 mL/min (ref >60)
GFR calc Af Amer: >60 mL/min (ref >60)
GFR calc non Af Amer: >60 mL/min (ref >60)
GFR calc non Af Amer: >60 mL/min (ref >60)
Glucose, Bld: 126 mg/dL — ABNORMAL HIGH (ref 70–99)
Glucose, Bld: 159 mg/dL — ABNORMAL HIGH (ref 70–99)
Glucose, Bld: 173 mg/dL — ABNORMAL HIGH (ref 70–99)
Potassium: 3.7 mEq/L (ref 3.5–5.1)
Potassium: 3.8 mEq/L (ref 3.5–5.1)
Potassium: 4 mEq/L (ref 3.5–5.1)
Sodium: 131 mEq/L — ABNORMAL LOW (ref 135–145)
Sodium: 132 mEq/L — ABNORMAL LOW (ref 135–145)
Sodium: 133 mEq/L — ABNORMAL LOW (ref 135–145)

## 2010-09-13 LAB — COMPREHENSIVE METABOLIC PANEL
BUN: 20 mg/dL (ref 6–23)
Calcium: 9.2 mg/dL (ref 8.4–10.5)
Chloride: 103 mEq/L (ref 96–112)
Creatinine, Ser: 0.85 mg/dL (ref 0.4–1.2)
GFR calc non Af Amer: >60 mL/min (ref >60)
Potassium: 4 mEq/L (ref 3.5–5.1)
Sodium: 138 mEq/L (ref 135–145)

## 2010-09-13 LAB — CBC
HCT: 25.2 % — ABNORMAL LOW (ref 36.0–46.0)
HCT: 31 % — ABNORMAL LOW (ref 36.0–46.0)
HCT: 33.7 % — ABNORMAL LOW (ref 36.0–46.0)
HCT: 36 % (ref 36.0–46.0)
Hemoglobin: 10.8 g/dL — ABNORMAL LOW (ref 12.0–15.0)
Hemoglobin: 10.9 g/dL — ABNORMAL LOW (ref 12.0–15.0)
Hemoglobin: 11.6 g/dL — ABNORMAL LOW (ref 12.0–15.0)
Hemoglobin: 8.9 g/dL — ABNORMAL LOW (ref 12.0–15.0)
MCHC: 34.4 g/dL (ref 30.0–36.0)
MCHC: 34.5 g/dL (ref 30.0–36.0)
MCHC: 34.6 g/dL (ref 30.0–36.0)
MCV: 88 fL (ref 78.0–100.0)
MCV: 88.1 fL (ref 78.0–100.0)
MCV: 89 fL (ref 78.0–100.0)
MCV: 89.2 fL (ref 78.0–100.0)
MCV: 89.2 fL (ref 78.0–100.0)
MCV: 89.6 fL (ref 78.0–100.0)
Platelets: 160 10*3/uL (ref 150–400)
Platelets: 186 10*3/uL (ref 150–400)
Platelets: 192 10*3/uL (ref 150–400)
RBC: 2.81 MIL/uL — ABNORMAL LOW (ref 3.87–5.11)
RBC: 2.89 MIL/uL — ABNORMAL LOW (ref 3.87–5.11)
RBC: 3.35 MIL/uL — ABNORMAL LOW (ref 3.87–5.11)
RBC: 3.5 MIL/uL — ABNORMAL LOW (ref 3.87–5.11)
RBC: 3.53 MIL/uL — ABNORMAL LOW (ref 3.87–5.11)
RBC: 3.79 MIL/uL — ABNORMAL LOW (ref 3.87–5.11)
RDW: 13.7 % (ref 11.5–15.5)
RDW: 14 % (ref 11.5–15.5)
RDW: 14 % (ref 11.5–15.5)
RDW: 14.1 % (ref 11.5–15.5)
WBC: 10.1 10*3/uL (ref 4.0–10.5)
WBC: 10.7 10*3/uL — ABNORMAL HIGH (ref 4.0–10.5)
WBC: 8.4 10*3/uL (ref 4.0–10.5)
WBC: 9.5 10*3/uL (ref 4.0–10.5)
WBC: 9.7 10*3/uL (ref 4.0–10.5)

## 2010-09-13 LAB — GLUCOSE, CAPILLARY
Glucose-Capillary: 164 mg/dL — ABNORMAL HIGH (ref 70–99)
Glucose-Capillary: 168 mg/dL — ABNORMAL HIGH (ref 70–99)
Glucose-Capillary: 170 mg/dL — ABNORMAL HIGH (ref 70–99)
Glucose-Capillary: 182 mg/dL — ABNORMAL HIGH (ref 70–99)
Glucose-Capillary: 197 mg/dL — ABNORMAL HIGH (ref 70–99)
Glucose-Capillary: 224 mg/dL — ABNORMAL HIGH (ref 70–99)
Glucose-Capillary: 284 mg/dL — ABNORMAL HIGH (ref 70–99)
Glucose-Capillary: 187 mg/dL — ABNORMAL HIGH (ref 70–99)
Glucose-Capillary: 117 mg/dL — ABNORMAL HIGH (ref 70–99)
Glucose-Capillary: 128 mg/dL — ABNORMAL HIGH (ref 70–99)
Glucose-Capillary: 203 mg/dL — ABNORMAL HIGH (ref 70–99)

## 2010-09-13 LAB — URINALYSIS, ROUTINE W REFLEX MICROSCOPIC
Bilirubin Urine: NEGATIVE
Hgb urine dipstick: NEGATIVE
Nitrite: POSITIVE — AB
pH: 6.5 (ref 5.0–8.0)

## 2010-09-13 LAB — PROTIME-INR
INR: 1.1 (ref 0.00–1.49)
INR: 1.2 (ref 0.00–1.49)
INR: 1 (ref 0.00–1.49)
Prothrombin Time: 13.7 seconds (ref 11.6–15.2)
Prothrombin Time: 14.5 seconds (ref 11.6–15.2)
Prothrombin Time: 12.9 seconds (ref 11.6–15.2)

## 2010-09-13 LAB — PHOSPHORUS: Phosphorus: 2.8 mg/dL (ref 2.3–4.6)

## 2010-09-13 LAB — CROSSMATCH: Antibody Screen: NEGATIVE

## 2010-09-13 LAB — HEMOCCULT GUIAC POC 1CARD (OFFICE): Fecal Occult Bld: NEGATIVE

## 2010-09-13 LAB — URINE MICROSCOPIC-ADD ON

## 2010-09-13 LAB — ABO/RH: ABO/RH(D): A POS

## 2010-09-13 LAB — URINALYSIS, MICROSCOPIC ONLY
Ketones, ur: NEGATIVE mg/dL
Protein, ur: NEGATIVE mg/dL
Urobilinogen, UA: 0.2 mg/dL (ref 0.0–1.0)

## 2010-09-13 LAB — CARDIAC PANEL(CRET KIN+CKTOT+MB+TROPI): CK, MB: 1.2 ng/mL (ref 0.3–4.0)

## 2010-09-13 LAB — HEMOGLOBIN A1C: Mean Plasma Glucose: 154 mg/dL

## 2010-09-15 LAB — GLUCOSE, CAPILLARY: Glucose-Capillary: 158 mg/dL — ABNORMAL HIGH (ref 70–99)

## 2010-09-16 LAB — GLUCOSE, CAPILLARY: Glucose-Capillary: 135 mg/dL — ABNORMAL HIGH (ref 70–99)

## 2010-09-18 LAB — GLUCOSE, CAPILLARY
Glucose-Capillary: 195 mg/dL — ABNORMAL HIGH (ref 70–99)
Glucose-Capillary: 281 mg/dL — ABNORMAL HIGH (ref 70–99)

## 2010-09-20 LAB — GLUCOSE, CAPILLARY: Glucose-Capillary: 172 mg/dL — ABNORMAL HIGH (ref 70–99)

## 2010-09-23 LAB — GLUCOSE, CAPILLARY: Glucose-Capillary: 155 mg/dL — ABNORMAL HIGH (ref 70–99)

## 2010-09-30 LAB — GLUCOSE, CAPILLARY

## 2010-10-02 LAB — GLUCOSE, CAPILLARY: Glucose-Capillary: 166 mg/dL — ABNORMAL HIGH (ref 70–99)

## 2010-10-04 LAB — GLUCOSE, CAPILLARY: Glucose-Capillary: 170 mg/dL — ABNORMAL HIGH (ref 70–99)

## 2010-10-08 ENCOUNTER — Inpatient Hospital Stay (HOSPITAL_COMMUNITY): DRG: 914 | Payer: Medicare HMO | Attending: Emergency Medicine

## 2010-10-08 ENCOUNTER — Ambulatory Visit (HOSPITAL_COMMUNITY)
Admission: EM | Admit: 2010-10-08 | Discharge: 2010-10-08 | Disposition: A | Discharge status: Home / Self Care | Payer: Medicare HMO | Source: Ambulatory Visit | Attending: Emergency Medicine | Admitting: Emergency Medicine

## 2010-10-08 DIAGNOSIS — W07XXXA Fall from chair, initial encounter: Secondary | ICD-10-CM | POA: Insufficient documentation

## 2010-10-08 DIAGNOSIS — D649 Anemia, unspecified: Secondary | ICD-10-CM | POA: Insufficient documentation

## 2010-10-08 DIAGNOSIS — Z7982 Long term (current) use of aspirin: Secondary | ICD-10-CM | POA: Insufficient documentation

## 2010-10-08 DIAGNOSIS — S060X0A Concussion without loss of consciousness, initial encounter: Secondary | ICD-10-CM | POA: Insufficient documentation

## 2010-10-08 DIAGNOSIS — F079 Unspecified personality and behavioral disorder due to known physiological condition: Secondary | ICD-10-CM | POA: Insufficient documentation

## 2010-10-08 DIAGNOSIS — Y998 Other external cause status: Secondary | ICD-10-CM | POA: Insufficient documentation

## 2010-10-08 DIAGNOSIS — S0003XA Contusion of scalp, initial encounter: Secondary | ICD-10-CM | POA: Insufficient documentation

## 2010-10-11 LAB — GLUCOSE, CAPILLARY: Glucose-Capillary: 185 mg/dL — ABNORMAL HIGH (ref 70–99)

## 2010-10-13 LAB — GLUCOSE, CAPILLARY: Glucose-Capillary: 171 mg/dL — ABNORMAL HIGH (ref 70–99)

## 2010-10-14 LAB — GLUCOSE, CAPILLARY

## 2010-10-16 LAB — GLUCOSE, CAPILLARY: Glucose-Capillary: 143 mg/dL — ABNORMAL HIGH (ref 70–99)

## 2010-10-18 LAB — GLUCOSE, CAPILLARY: Glucose-Capillary: 176 mg/dL — ABNORMAL HIGH (ref 70–99)

## 2010-10-21 LAB — GLUCOSE, CAPILLARY

## 2010-10-23 LAB — GLUCOSE, CAPILLARY
Glucose-Capillary: 174 mg/dL — ABNORMAL HIGH (ref 70–99)
Glucose-Capillary: 149 mg/dL — ABNORMAL HIGH (ref 70–99)

## 2010-10-24 ENCOUNTER — Inpatient Hospital Stay (HOSPITAL_COMMUNITY)
Admit: 2010-10-24 | Discharge: 2010-10-24 | Disposition: A | Discharge status: Home / Self Care | Payer: Medicare HMO | Attending: Internal Medicine | Admitting: Internal Medicine

## 2010-10-24 ENCOUNTER — Ambulatory Visit (HOSPITAL_COMMUNITY): Payer: Medicare HMO | Attending: Internal Medicine

## 2010-10-24 DIAGNOSIS — R05 Cough: Secondary | ICD-10-CM | POA: Insufficient documentation

## 2010-10-24 DIAGNOSIS — R059 Cough, unspecified: Secondary | ICD-10-CM | POA: Insufficient documentation

## 2010-10-28 LAB — GLUCOSE, CAPILLARY

## 2010-10-30 LAB — GLUCOSE, CAPILLARY: Glucose-Capillary: 175 mg/dL — ABNORMAL HIGH (ref 70–99)

## 2010-11-01 LAB — GLUCOSE, CAPILLARY: Glucose-Capillary: 166 mg/dL — ABNORMAL HIGH (ref 70–99)

## 2010-11-04 LAB — GLUCOSE, CAPILLARY: Glucose-Capillary: 167 mg/dL — ABNORMAL HIGH (ref 70–99)

## 2010-11-08 LAB — GLUCOSE, CAPILLARY: Glucose-Capillary: 171 mg/dL — ABNORMAL HIGH (ref 70–99)

## 2010-11-10 LAB — GLUCOSE, CAPILLARY: Glucose-Capillary: 144 mg/dL — ABNORMAL HIGH (ref 70–99)

## 2010-11-12 LAB — GLUCOSE, CAPILLARY: Glucose-Capillary: 168 mg/dL — ABNORMAL HIGH (ref 70–99)

## 2010-11-13 LAB — GLUCOSE, CAPILLARY

## 2010-11-15 LAB — GLUCOSE, CAPILLARY: Glucose-Capillary: 173 mg/dL — ABNORMAL HIGH (ref 70–99)

## 2010-11-18 LAB — GLUCOSE, CAPILLARY
Glucose-Capillary: 160 mg/dL — ABNORMAL HIGH (ref 70–99)
Glucose-Capillary: 173 mg/dL — ABNORMAL HIGH (ref 70–99)

## 2010-11-20 LAB — GLUCOSE, CAPILLARY
Glucose-Capillary: 187 mg/dL — ABNORMAL HIGH (ref 70–99)
Glucose-Capillary: 195 mg/dL — ABNORMAL HIGH (ref 70–99)

## 2010-11-22 LAB — GLUCOSE, CAPILLARY: Glucose-Capillary: 178 mg/dL — ABNORMAL HIGH (ref 70–99)

## 2010-11-29 LAB — GLUCOSE, CAPILLARY
Glucose-Capillary: 157 mg/dL — ABNORMAL HIGH (ref 70–99)
Glucose-Capillary: 143 mg/dL — ABNORMAL HIGH (ref 70–99)

## 2010-12-02 LAB — GLUCOSE, CAPILLARY: Glucose-Capillary: 170 mg/dL — ABNORMAL HIGH (ref 70–99)

## 2010-12-04 LAB — GLUCOSE, CAPILLARY: Glucose-Capillary: 168 mg/dL — ABNORMAL HIGH (ref 70–99)

## 2010-12-12 LAB — GLUCOSE, CAPILLARY: Glucose-Capillary: 153 mg/dL — ABNORMAL HIGH (ref 70–99)

## 2010-12-13 LAB — GLUCOSE, CAPILLARY: Glucose-Capillary: 157 mg/dL — ABNORMAL HIGH (ref 70–99)

## 2010-12-16 LAB — GLUCOSE, CAPILLARY
Glucose-Capillary: 145 mg/dL — ABNORMAL HIGH (ref 70–99)
Glucose-Capillary: 158 mg/dL — ABNORMAL HIGH (ref 70–99)

## 2010-12-20 LAB — GLUCOSE, CAPILLARY: Glucose-Capillary: 174 mg/dL — ABNORMAL HIGH (ref 70–99)

## 2010-12-25 LAB — GLUCOSE, CAPILLARY: Glucose-Capillary: 114 mg/dL — ABNORMAL HIGH (ref 70–99)

## 2010-12-27 LAB — GLUCOSE, CAPILLARY: Glucose-Capillary: 149 mg/dL — ABNORMAL HIGH (ref 70–99)

## 2010-12-30 LAB — GLUCOSE, CAPILLARY
Glucose-Capillary: 155 mg/dL — ABNORMAL HIGH (ref 70–99)
Glucose-Capillary: 141 mg/dL — ABNORMAL HIGH (ref 70–99)

## 2011-01-03 LAB — GLUCOSE, CAPILLARY
Glucose-Capillary: 155 mg/dL — ABNORMAL HIGH (ref 70–99)
Glucose-Capillary: 157 mg/dL — ABNORMAL HIGH (ref 70–99)

## 2011-01-08 LAB — GLUCOSE, CAPILLARY
Glucose-Capillary: 180 mg/dL — ABNORMAL HIGH (ref 70–99)
Glucose-Capillary: 157 mg/dL — ABNORMAL HIGH (ref 70–99)

## 2011-01-09 LAB — GLUCOSE, CAPILLARY

## 2011-01-10 LAB — GLUCOSE, CAPILLARY: Glucose-Capillary: 174 mg/dL — ABNORMAL HIGH (ref 70–99)

## 2011-01-13 LAB — GLUCOSE, CAPILLARY: Glucose-Capillary: 185 mg/dL — ABNORMAL HIGH (ref 70–99)

## 2011-01-15 LAB — GLUCOSE, CAPILLARY: Glucose-Capillary: 157 mg/dL — ABNORMAL HIGH (ref 70–99)

## 2011-01-17 LAB — GLUCOSE, CAPILLARY: Glucose-Capillary: 157 mg/dL — ABNORMAL HIGH (ref 70–99)

## 2011-01-19 LAB — GLUCOSE, CAPILLARY: Glucose-Capillary: 159 mg/dL — ABNORMAL HIGH (ref 70–99)

## 2011-01-20 LAB — GLUCOSE, CAPILLARY: Glucose-Capillary: 154 mg/dL — ABNORMAL HIGH (ref 70–99)

## 2011-01-21 ENCOUNTER — Emergency Department (HOSPITAL_COMMUNITY): Payer: Medicare HMO

## 2011-01-21 ENCOUNTER — Encounter: Payer: Self-pay | Admitting: Emergency Medicine

## 2011-01-21 ENCOUNTER — Other Ambulatory Visit: Payer: Self-pay

## 2011-01-21 ENCOUNTER — Telehealth: Payer: Self-pay | Admitting: Orthopedic Surgery

## 2011-01-21 ENCOUNTER — Inpatient Hospital Stay (HOSPITAL_COMMUNITY)
Admission: EM | Admit: 2011-01-21 | Discharge: 2011-01-24 | Disposition: A | Discharge status: Other Acute Inpatient Hospital | Payer: Medicare HMO | Source: Home / Self Care | Attending: Internal Medicine | Admitting: Internal Medicine

## 2011-01-21 DIAGNOSIS — B961 Klebsiella pneumoniae [K. pneumoniae] as the cause of diseases classified elsewhere: Secondary | ICD-10-CM | POA: Diagnosis present

## 2011-01-21 DIAGNOSIS — G9340 Encephalopathy, unspecified: Secondary | ICD-10-CM | POA: Diagnosis not present

## 2011-01-21 DIAGNOSIS — J449 Chronic obstructive pulmonary disease, unspecified: Secondary | ICD-10-CM

## 2011-01-21 DIAGNOSIS — Y998 Other external cause status: Secondary | ICD-10-CM

## 2011-01-21 DIAGNOSIS — D649 Anemia, unspecified: Secondary | ICD-10-CM | POA: Diagnosis not present

## 2011-01-21 DIAGNOSIS — E119 Type 2 diabetes mellitus without complications: Secondary | ICD-10-CM | POA: Diagnosis present

## 2011-01-21 DIAGNOSIS — Z96659 Presence of unspecified artificial knee joint: Secondary | ICD-10-CM

## 2011-01-21 DIAGNOSIS — J4489 Other specified chronic obstructive pulmonary disease: Secondary | ICD-10-CM | POA: Diagnosis present

## 2011-01-21 DIAGNOSIS — Y9210 Unspecified residential institution as the place of occurrence of the external cause: Secondary | ICD-10-CM | POA: Diagnosis present

## 2011-01-21 DIAGNOSIS — N390 Urinary tract infection, site not specified: Secondary | ICD-10-CM | POA: Diagnosis not present

## 2011-01-21 DIAGNOSIS — F09 Unspecified mental disorder due to known physiological condition: Secondary | ICD-10-CM | POA: Diagnosis present

## 2011-01-21 DIAGNOSIS — F0280 Dementia in other diseases classified elsewhere without behavioral disturbance: Secondary | ICD-10-CM | POA: Diagnosis present

## 2011-01-21 DIAGNOSIS — I482 Chronic atrial fibrillation, unspecified: Secondary | ICD-10-CM

## 2011-01-21 DIAGNOSIS — R296 Repeated falls: Secondary | ICD-10-CM | POA: Diagnosis present

## 2011-01-21 DIAGNOSIS — S72009A Fracture of unspecified part of neck of unspecified femur, initial encounter for closed fracture: Secondary | ICD-10-CM | POA: Diagnosis present

## 2011-01-21 DIAGNOSIS — E860 Dehydration: Secondary | ICD-10-CM | POA: Diagnosis present

## 2011-01-21 DIAGNOSIS — I4891 Unspecified atrial fibrillation: Secondary | ICD-10-CM | POA: Diagnosis present

## 2011-01-21 DIAGNOSIS — G309 Alzheimer's disease, unspecified: Secondary | ICD-10-CM | POA: Diagnosis present

## 2011-01-21 DIAGNOSIS — E876 Hypokalemia: Secondary | ICD-10-CM | POA: Diagnosis not present

## 2011-01-21 DIAGNOSIS — G5790 Unspecified mononeuropathy of unspecified lower limb: Secondary | ICD-10-CM | POA: Diagnosis present

## 2011-01-21 DIAGNOSIS — R410 Disorientation, unspecified: Secondary | ICD-10-CM | POA: Diagnosis present

## 2011-01-21 DIAGNOSIS — Z7982 Long term (current) use of aspirin: Secondary | ICD-10-CM

## 2011-01-21 HISTORY — DX: Acute kidney failure, unspecified: N17.9

## 2011-01-21 HISTORY — DX: Syncope and collapse: R55

## 2011-01-21 HISTORY — DX: Difficulty in walking, not elsewhere classified: R26.2

## 2011-01-21 HISTORY — DX: Essential (primary) hypertension: I10

## 2011-01-21 HISTORY — DX: Repeated falls: R29.6

## 2011-01-21 HISTORY — DX: Urinary tract infection, site not specified: N39.0

## 2011-01-21 HISTORY — DX: Unspecified mental disorder due to known physiological condition: F09

## 2011-01-21 HISTORY — DX: Cardiac arrhythmia, unspecified: I49.9

## 2011-01-21 HISTORY — DX: Other specified abnormal findings of blood chemistry: R79.89

## 2011-01-21 HISTORY — DX: Anemia, unspecified: D64.9

## 2011-01-21 HISTORY — DX: Hyperkalemia: E87.5

## 2011-01-21 LAB — HEPATIC FUNCTION PANEL
ALT: 13 U/L (ref 0–35)
AST: 11 U/L (ref 0–37)
Alkaline Phosphatase: 125 U/L — ABNORMAL HIGH (ref 39–117)
Bilirubin, Direct: <0.1 mg/dL (ref 0.0–0.3)

## 2011-01-21 LAB — DIFFERENTIAL
Basophils Absolute: 0.1 10*3/uL (ref 0.0–0.1)
Basophils Relative: 0 % (ref 0–1)
Monocytes Relative: 7 % (ref 3–12)
Neutro Abs: 12.5 10*3/uL — ABNORMAL HIGH (ref 1.7–7.7)
Neutrophils Relative %: 75 % (ref 43–77)

## 2011-01-21 LAB — URINALYSIS, ROUTINE W REFLEX MICROSCOPIC
Glucose, UA: NEGATIVE mg/dL
Ketones, ur: NEGATIVE mg/dL
Leukocytes, UA: NEGATIVE
Nitrite: POSITIVE — AB
Protein, ur: NEGATIVE mg/dL
pH: 6 (ref 5.0–8.0)

## 2011-01-21 LAB — BASIC METABOLIC PANEL
BUN: 23 mg/dL (ref 6–23)
Chloride: 96 mEq/L (ref 96–112)
GFR calc Af Amer: 52 mL/min — ABNORMAL LOW (ref >60)
Potassium: 4.7 mEq/L (ref 3.5–5.1)
Sodium: 133 mEq/L — ABNORMAL LOW (ref 135–145)

## 2011-01-21 LAB — CBC
Hemoglobin: 12.3 g/dL (ref 12.0–15.0)
MCHC: 33.2 g/dL (ref 30.0–36.0)
Platelets: 305 10*3/uL (ref 150–400)

## 2011-01-21 LAB — HEMOGLOBIN A1C: Hgb A1c MFr Bld: 8.3 % — ABNORMAL HIGH (ref <5.7)

## 2011-01-21 LAB — TSH: TSH: 1.219 u[IU]/mL (ref 0.350–4.500)

## 2011-01-21 LAB — GLUCOSE, CAPILLARY: Glucose-Capillary: 193 mg/dL — ABNORMAL HIGH (ref 70–99)

## 2011-01-21 LAB — SURGICAL PCR SCREEN
MRSA, PCR: NEGATIVE
Staphylococcus aureus: POSITIVE — AB

## 2011-01-21 LAB — URINE MICROSCOPIC-ADD ON

## 2011-01-21 LAB — CARDIAC PANEL(CRET KIN+CKTOT+MB+TROPI): Troponin I: <0.3 ng/mL (ref <0.30)

## 2011-01-21 LAB — PROTIME-INR: Prothrombin Time: 12.8 seconds (ref 11.6–15.2)

## 2011-01-21 MED ORDER — POVIDONE-IODINE 10 % EX SOLN
Freq: Once | CUTANEOUS | Status: DC
  Filled 2011-01-21: qty 118

## 2011-01-21 MED ORDER — INSULIN ASPART 100 UNIT/ML ~~LOC~~ SOLN
0.0000 [IU] | Freq: Three times a day (TID) | SUBCUTANEOUS | Status: DC
  Filled 2011-01-21: qty 10

## 2011-01-21 MED ORDER — ONDANSETRON HCL 4 MG/2ML IJ SOLN
4.0000 mg | Freq: Four times a day (QID) | INTRAMUSCULAR | Status: DC | PRN
  Administered 2011-01-22 – 2011-01-23 (×3): 4 mg via INTRAVENOUS
  Filled 2011-01-21 (×3): qty 2

## 2011-01-21 MED ORDER — INSULIN ASPART 100 UNIT/ML ~~LOC~~ SOLN
0.0000 [IU] | Freq: Every day | SUBCUTANEOUS | Status: DC
  Administered 2011-01-22: 0 [IU] via SUBCUTANEOUS

## 2011-01-21 MED ORDER — INSULIN ASPART 100 UNIT/ML ~~LOC~~ SOLN: 0.0000 [IU] | Freq: Three times a day (TID) | SUBCUTANEOUS | Status: DC

## 2011-01-21 MED ORDER — FLEET ENEMA 7-19 GM/118ML RE ENEM: 1.0000 | ENEMA | RECTAL | Status: DC | PRN

## 2011-01-21 MED ORDER — POTASSIUM CHLORIDE IN NACL 20-0.9 MEQ/L-% IV SOLN
INTRAVENOUS | Status: DC
  Administered 2011-01-21 – 2011-01-23 (×6): via INTRAVENOUS

## 2011-01-21 MED ORDER — INSULIN ASPART 100 UNIT/ML ~~LOC~~ SOLN
0.0000 [IU] | Freq: Three times a day (TID) | SUBCUTANEOUS | Status: DC
  Administered 2011-01-21: 2 [IU] via SUBCUTANEOUS
  Administered 2011-01-21: 1 [IU] via SUBCUTANEOUS
  Administered 2011-01-22 – 2011-01-23 (×4): 2 [IU] via SUBCUTANEOUS
  Administered 2011-01-23 – 2011-01-24 (×2): 1 [IU] via SUBCUTANEOUS
  Filled 2011-01-21: qty 3

## 2011-01-21 MED ORDER — ACETAMINOPHEN 650 MG RE SUPP: 650.0000 mg | Freq: Four times a day (QID) | RECTAL | Status: DC | PRN

## 2011-01-21 MED ORDER — SODIUM CHLORIDE 0.9 % IV SOLN: INTRAVENOUS | Status: DC

## 2011-01-21 MED ORDER — SODIUM CHLORIDE 0.9 % IV SOLN
Freq: Once | INTRAVENOUS | Status: AC
  Administered 2011-01-21: 04:00:00 via INTRAVENOUS

## 2011-01-21 MED ORDER — PROPAFENONE HCL 150 MG PO TABS
225.0000 mg | ORAL_TABLET | Freq: Two times a day (BID) | ORAL | Status: DC
  Administered 2011-01-21 – 2011-01-23 (×5): 225 mg via ORAL
  Filled 2011-01-21 (×5): qty 2
  Filled 2011-01-21: qty 1
  Filled 2011-01-21 (×4): qty 2
  Filled 2011-01-21: qty 1
  Filled 2011-01-21 (×4): qty 2
  Filled 2011-01-21: qty 1
  Filled 2011-01-21 (×2): qty 2

## 2011-01-21 MED ORDER — DILTIAZEM HCL ER COATED BEADS 180 MG PO CP24
180.0000 mg | ORAL_CAPSULE | Freq: Every day | ORAL | Status: DC
  Administered 2011-01-21 – 2011-01-23 (×2): 180 mg via ORAL
  Filled 2011-01-21 (×3): qty 1

## 2011-01-21 MED ORDER — CHLORHEXIDINE GLUCONATE CLOTH 2 % EX PADS
6.0000 | MEDICATED_PAD | Freq: Every day | CUTANEOUS | Status: DC
  Administered 2011-01-22 – 2011-01-24 (×3): 6 via TOPICAL

## 2011-01-21 MED ORDER — GUAIFENESIN ER 600 MG PO TB12
1200.0000 mg | ORAL_TABLET | Freq: Two times a day (BID) | ORAL | Status: DC
  Administered 2011-01-21 – 2011-01-23 (×4): 1200 mg via ORAL
  Filled 2011-01-21: qty 2
  Filled 2011-01-21: qty 1
  Filled 2011-01-21 (×4): qty 2

## 2011-01-21 MED ORDER — ACETAMINOPHEN 325 MG PO TABS: 650.0000 mg | ORAL_TABLET | Freq: Four times a day (QID) | ORAL | Status: DC | PRN

## 2011-01-21 MED ORDER — INSULIN ASPART 100 UNIT/ML ~~LOC~~ SOLN
0.0000 [IU] | Freq: Every day | SUBCUTANEOUS | Status: DC
  Filled 2011-01-21: qty 10

## 2011-01-21 MED ORDER — BISACODYL 10 MG RE SUPP: 10.0000 mg | RECTAL | Status: DC | PRN

## 2011-01-21 MED ORDER — SODIUM CHLORIDE 0.9 % IJ SOLN
INTRAMUSCULAR | Status: AC
  Administered 2011-01-21: 09:00:00
  Filled 2011-01-21: qty 10

## 2011-01-21 MED ORDER — MORPHINE SULFATE 4 MG/ML IJ SOLN
4.0000 mg | Freq: Once | INTRAMUSCULAR | Status: AC
  Administered 2011-01-21: 4 mg via INTRAVENOUS
  Filled 2011-01-21: qty 1

## 2011-01-21 MED ORDER — METOPROLOL TARTRATE 25 MG PO TABS
12.5000 mg | ORAL_TABLET | Freq: Two times a day (BID) | ORAL | Status: DC
  Administered 2011-01-21 – 2011-01-23 (×4): 12.5 mg via ORAL
  Filled 2011-01-21 (×5): qty 1

## 2011-01-21 MED ORDER — HYDROMORPHONE HCL 1 MG/ML IJ SOLN
0.5000 mg | INTRAMUSCULAR | Status: DC | PRN
  Administered 2011-01-21 – 2011-01-23 (×15): 0.5 mg via INTRAVENOUS
  Administered 2011-01-24 (×3): via INTRAVENOUS
  Administered 2011-01-24: 0.5 mg via INTRAVENOUS
  Filled 2011-01-21 (×20): qty 1

## 2011-01-21 MED ORDER — LISINOPRIL 5 MG PO TABS
5.0000 mg | ORAL_TABLET | Freq: Every day | ORAL | Status: DC
  Administered 2011-01-21 – 2011-01-23 (×2): 5 mg via ORAL
  Filled 2011-01-21 (×3): qty 1

## 2011-01-21 MED ORDER — CEFAZOLIN SODIUM 1-5 GM-% IV SOLN
1.0000 g | Freq: Once | INTRAVENOUS | Status: DC
  Filled 2011-01-21: qty 50

## 2011-01-21 MED ORDER — GLIPIZIDE ER 2.5 MG PO TB24
2.5000 mg | ORAL_TABLET | Freq: Every day | ORAL | Status: DC
  Administered 2011-01-21 – 2011-01-23 (×2): 2.5 mg via ORAL
  Filled 2011-01-21 (×7): qty 1

## 2011-01-21 MED ORDER — DOCUSATE SODIUM 100 MG PO CAPS
100.0000 mg | ORAL_CAPSULE | Freq: Two times a day (BID) | ORAL | Status: DC
  Administered 2011-01-21 – 2011-01-23 (×4): 100 mg via ORAL
  Filled 2011-01-21 (×6): qty 1

## 2011-01-21 MED ORDER — SODIUM CHLORIDE 0.9 % IV SOLN: Freq: Once | INTRAVENOUS | Status: DC

## 2011-01-21 MED ORDER — MUPIROCIN 2 % EX OINT
1.0000 "application " | TOPICAL_OINTMENT | Freq: Two times a day (BID) | CUTANEOUS | Status: DC
  Administered 2011-01-21 – 2011-01-24 (×5): 1 via NASAL
  Filled 2011-01-21: qty 22

## 2011-01-21 MED ORDER — TRAZODONE HCL 50 MG PO TABS: 25.0000 mg | ORAL_TABLET | Freq: Every evening | ORAL | Status: DC | PRN

## 2011-01-21 MED ORDER — ONDANSETRON HCL 4 MG PO TABS: 4.0000 mg | ORAL_TABLET | Freq: Four times a day (QID) | ORAL | Status: DC | PRN

## 2011-01-21 MED ORDER — MEMANTINE HCL 10 MG PO TABS
10.0000 mg | ORAL_TABLET | Freq: Two times a day (BID) | ORAL | Status: DC
  Administered 2011-01-21 – 2011-01-23 (×5): 10 mg via ORAL
  Filled 2011-01-21 (×6): qty 1

## 2011-01-21 NOTE — Plan of Care (Signed)
Problem: Consults Goal: Diagnosis- Total Joint Replacement Outcome: Completed/Met Date Met:  01/21/11 Primary Total Hip

## 2011-01-21 NOTE — Consult Note (Signed)
She is 77 yo nursing home patient with history of dementia and marked confusion.  She fell at the nursing home early this am and sustained a femoral neck fracture on the  Right.  She will need a bipolar hip prosthesis.  I will need to talk to a family member about the planned procedure, risks and imponderables.  She is at increased risk secondary to age and confusion.  I have dictated a consult note, [id number].  Surgery tentatively scheduled for tomorrow mid day.

## 2011-01-21 NOTE — Consult Note (Signed)
[name], EVANGELIST                  ACCOUNT NO.:  [id number]  MEDICAL RECORD NO.:  [id number]  LOCATION:  A312                          FACILITY:  [facility]  PHYSICIAN:  J. Darreld Mclean, M.D. DATE OF BIRTH:  1924/06/15  DATE OF CONSULTATION: DATE OF DISCHARGE:                                CONSULTATION   The patient seen at the request of the ER and the hospitalist.  The patient is an 76 year old female resident of a local nursing home who fell at the nursing home and injured her right hip, it happened early this morning.  X-rays in the emergency room revealed displaced femoral neck fracture on the right.  There are no other injuries.  The patient has severe dementia and is very confused.  She does not know where she is, the year, the present or circumstances, although she does think she is in a medical facility being treated for pain in her hip.  She has a history of hypertension and diabetes type 2, spells history in the past due to dehydration.  Did not have a list of her surgeries.  List of medications are in the chart and will not be repeated here. Please refer to notes of Dr. Estell Harpin.  She has a history of anemia, acute renal failure in the past, hyperkalemia, urinary tract infections.  On admission, her hemoglobin was 12.3 with an elevated white count and her potassium was 4.7.  Sodium 133, chloride 96, glucose 204, BUN 23, creatinine 1.19.  There is no head injury at the time of fall.  The patient will need a bipolar replacement.  I needed to speak to her family member and I discussed the surgery risks and imponderables. Therefore, I am scheduling surgery for tomorrow, Wednesday, August 15. Risks and imponderables including infection, embolism which could lead to death, need for blood transfusion, possibility nerve injury, leg length inequality, her confusion may slow down recovery, she goes back to the nursing home.  I have tentatively scheduled for surgery tomorrow around  lunch time pending further medical evaluation and talking to her family members.  She is at high risk secondary to her confusion and her age.          ______________________________ J. Darreld Mclean, M.D.     JWK/MEDQ  D:  01/21/2011  T:  01/21/2011  Job:  [id number]

## 2011-01-21 NOTE — ED Notes (Addendum)
Pt reports some improvement in pain level.  Unable to provide on 0-10 scale, but does state she "feels better."

## 2011-01-21 NOTE — ED Provider Notes (Signed)
History     CSN: [id number] Arrival date & time: 01/21/2011  1:31 AM  Chief Complaint  Patient presents with  . Fall  . Leg Pain   Patient is a 76 y.o. female presenting with fall and leg pain. The history is provided by the patient.  Fall The accident occurred 1 to 2 hours ago (Patient states he stood up and fell when trying to pick something up has pain in left hip did not hit head did not loss consciousness no pain place else and left hip). The fall occurred while walking. She fell from a height of 3 to 5 ft. She landed on a hard floor. The point of impact was the left hip. The pain is at a severity of 5/10. The pain is moderate. She was ambulatory at the scene. Pertinent negatives include no fever, no abdominal pain, no hematuria and no headaches.  Leg Pain     Past Medical History  Diagnosis Date  . Falls frequently   . Cardiac dysrhythmia   . Anemia   . Organic brain syndrome   . Difficulty walking   . Acute renal failure   . Azotemia   . Hyperkalemia   . UTI (lower urinary tract infection)   . Syncope     No past surgical history on file.  No family history on file.  History  Substance Use Topics  . Smoking status: Not on file  . Smokeless tobacco: Not on file  . Alcohol Use:      unable to assess    OB History    Grav Para Term Preterm Abortions TAB SAB Ect Mult Living                  Review of Systems  Constitutional: Negative for fever and fatigue.  HENT: Negative for congestion, sinus pressure and ear discharge.   Eyes: Negative for discharge.  Respiratory: Negative for cough.   Cardiovascular: Negative for chest pain.  Gastrointestinal: Negative for abdominal pain and diarrhea.  Genitourinary: Negative for frequency and hematuria.  Musculoskeletal: Negative for back pain.       Left hip pain  Skin: Negative for rash.  Neurological: Negative for seizures and headaches.  Hematological: Negative.   Psychiatric/Behavioral: Negative for  hallucinations.    Physical Exam  BP 149/47  Pulse 70  Temp 98.2 F (36.8 C)  Resp 20  SpO2 94%  Physical Exam  Constitutional: She is oriented to person, place, and time. She appears well-developed.  HENT:  Head: Normocephalic and atraumatic.  Eyes: Conjunctivae and EOM are normal. No scleral icterus.  Neck: Neck supple. No thyromegaly present.  Cardiovascular: Normal rate and regular rhythm.  Exam reveals no gallop and no friction rub.   No murmur heard. Pulmonary/Chest: No stridor. She has no wheezes. She has no rales. She exhibits no tenderness.  Abdominal: She exhibits no distension. There is no tenderness. There is no rebound.  Musculoskeletal: Normal range of motion. She exhibits no edema.       Tender left hip  Lymphadenopathy:    She has no cervical adenopathy.  Neurological: She is oriented to person, place, and time. Coordination normal.  Skin: No rash noted. No erythema.  Psychiatric: She has a normal mood and affect. Her behavior is normal.    ED Course  Procedures  MDM Hip fracture    Results for orders placed during the hospital encounter of 01/21/11  CBC      Component Value Range   WBC  16.7 (*) 4.0 - 10.5 (K/uL)   RBC 4.05  3.87 - 5.11 (MIL/uL)   Hemoglobin 12.3  12.0 - 15.0 (g/dL)   HCT 91.4  78.2 - 95.6 (%)   MCV 91.4  78.0 - 100.0 (fL)   MCH 30.4  26.0 - 34.0 (pg)   MCHC 33.2  30.0 - 36.0 (g/dL)   RDW 21.3  08.6 - 57.8 (%)   Platelets 305  150 - 400 (K/uL)  DIFFERENTIAL      Component Value Range   Neutrophils Relative 75  43 - 77 (%)   Neutro Abs 12.5 (*) 1.7 - 7.7 (K/uL)   Lymphocytes Relative 13  12 - 46 (%)   Lymphs Abs 2.2  0.7 - 4.0 (K/uL)   Monocytes Relative 7  3 - 12 (%)   Monocytes Absolute 1.2 (*) 0.1 - 1.0 (K/uL)   Eosinophils Relative 4  0 - 5 (%)   Eosinophils Absolute 0.7  0.0 - 0.7 (K/uL)   Basophils Relative 0  0 - 1 (%)   Basophils Absolute 0.1  0.0 - 0.1 (K/uL)  BASIC METABOLIC PANEL      Component Value Range    Sodium 133 (*) 135 - 145 (mEq/L)   Potassium 4.7  3.5 - 5.1 (mEq/L)   Chloride 96  96 - 112 (mEq/L)   CO2 22  19 - 32 (mEq/L)   Glucose, Bld 204 (*) 70 - 99 (mg/dL)   BUN 23  6 - 23 (mg/dL)   Creatinine, Ser 4.69 (*) 0.50 - 1.10 (mg/dL)   Calcium 62.9  8.4 - 10.5 (mg/dL)   GFR calc non Af Amer 43 (*) >60 (mL/min)   GFR calc Af Amer 52 (*) >60 (mL/min)   Dg Chest 1 View  01/21/2011  *RADIOLOGY REPORT*  Clinical Data: Hip fracture  CHEST - 1 VIEW  Comparison: 10/24/2010  Findings: Normal heart size and pulmonary vascularity. Emphysematous changes in the lungs.  Peribronchial thickening centrally consistent with chronic bronchitis.  No focal airspace consolidation.  No blunting of costophrenic angles.  Calcified aorta.  IMPRESSION: Emphysematous and chronic bronchitic changes.  No evidence of active pulmonary disease.  Original Report Authenticated By: Marlon Pel, M.D.   Dg Hip Complete Left  01/21/2011  *RADIOLOGY REPORT*  Clinical Data: Deformity after fall.  LEFT HIP - COMPLETE 2+ VIEW  Comparison: 07/23/2010  Findings: There is an acute fracture of the left femoral neck with superolateral displacement of the distal fracture fragment resulting in varus angulation.  No acetabular dislocation.  No focal bone lesions identified.  Fairly extensive vascular calcifications are noted.  Previous right hip arthroplasty.  The pelvis, SI joints, and symphysis pubis appear intact.  Per CMS PQRS reporting requirements (PQRS Measure 24): Given the patient's age of greater than 50 and the fracture site (hip, distal radius, or spine), the patient should be tested for osteoporosis using DXA, and the appropriate treatment considered based on the DXA results.  IMPRESSION: Acute fracture of the left femoral neck with varus angulation.  Original Report Authenticated By: Marlon Pel, M.D.          Benny Lennert, MD 01/21/11 [phone]

## 2011-01-21 NOTE — ED Notes (Signed)
Patient fell at [facility]; sent to ER for evaluation of left leg pain.

## 2011-01-21 NOTE — Telephone Encounter (Signed)
[name]'s daughter [name] wants you to call her about her mother.  [name] was in [facility] ER last [name] and was admitted for ? fractured hip.  Says [name] has not walked since her last hip surgery and she does not think she is able to go thru another surgery.  She is anxious to speak with you about this situation.   Told her you were not on call last [name].  Please call her at [phone]

## 2011-01-21 NOTE — Progress Notes (Signed)
CRITICAL VALUE ALERT  Critical value received:  Surgical PCR - positive for staph aureaus  Date of notification:  01/21/11  Time of notification:  1615  Critical value read back:yes  Nurse who received alert:  Lewis Shock, RN  MD notified (1st page):  Dr Karilyn Cota  Time of first page:  1630  MD notified (2nd page):  Time of second page:  Responding MD:  Dr Karilyn Cota  Time MD responded:  [phone]

## 2011-01-21 NOTE — H&P (Signed)
PCP:   Andrea Melena, MD Andrea Najjar, MD  Chief Complaint:  fall  HPI: [name] in resident of a nursing center with a history of diabetes hypertension, atrial fibrillation organic brain syndrome, organic dementia who sustained a fall and injured her left hip and was brought to the emergency room for further evaluation  A shunt is somewhat confused and disoriented and unable to contribute much further to her history. She denies chest pains or shortness of breath she denies nausea vomiting or diarrhea she denies passage of bloody or black stool  Review of Systems:  The patient denies anorexia, fever, weight loss,, vision loss, decreased hearing, hoarseness, chest pain, syncope, dyspnea on exertion, peripheral edema, balance deficits, hemoptysis, abdominal pain, [name], hematochezia, severe indigestion/heartburn, hematuria, incontinence, genital sores, muscle weakness, suspicious skin lesions, transient blindness, difficulty walking, depression, unusual weight change, abnormal bleeding, enlarged lymph nodes, angioedema, and breast masses.  Past Medical History: Past Medical History  Diagnosis Date  . Falls frequently   . Cardiac dysrhythmia   . Anemia   . Organic brain syndrome   . Difficulty walking   . Acute renal failure   . Azotemia   . Hyperkalemia   . UTI (lower urinary tract infection)   . Syncope    No past surgical history on file.  Medications: Prior to Admission medications   Medication Sig Start Date End Date Taking? Authorizing Provider  aspirin 325 MG tablet Take 325 mg by mouth daily.     Yes Historical Provider, MD  calcium carbonate (TUMS - DOSED IN MG ELEMENTAL CALCIUM) 500 MG chewable tablet Chew 1 tablet by mouth 2 (two) times daily.     Yes Historical Provider, MD  diltiazem (CARDIZEM CD) 180 MG 24 hr capsule Take 180 mg by mouth daily.     Yes Historical Provider, MD  diphenhydrAMINE (BENADRYL) 25 MG tablet Take 25 mg by mouth every 6 (six)  hours as needed.     Yes Historical Provider, MD  docusate sodium (COLACE) 100 MG capsule Take 100 mg by mouth 2 (two) times daily.     Yes Historical Provider, MD  glipiZIDE (GLUCOTROL XL) 2.5 MG 24 hr tablet Take 2.5 mg by mouth daily.     Yes Historical Provider, MD  guaiFENesin (MUCINEX) 600 MG 12 hr tablet Take 1,200 mg by mouth 2 (two) times daily.     Yes Historical Provider, MD  HYDROcodone-acetaminophen (VICODIN) 5-500 MG per tablet Take 1 tablet by mouth every 4 (four) hours as needed.     Yes Historical Provider, MD  lisinopril (PRINIVIL,ZESTRIL) 5 MG tablet Take 5 mg by mouth daily.     Yes Historical Provider, MD  memantine (NAMENDA) 10 MG tablet Take 10 mg by mouth 2 (two) times daily.     Yes Historical Provider, MD  metFORMIN (GLUCOPHAGE) 500 MG tablet Take 500 mg by mouth daily with breakfast.     Yes Historical Provider, MD  metoprolol tartrate (LOPRESSOR) 25 MG tablet Take 12.5 mg by mouth 2 (two) times daily.     Yes Historical Provider, MD  propafenone (RYTHMOL) 225 MG tablet Take 225 mg by mouth 2 (two) times daily.     Yes Historical Provider, MD  senna (SENOKOT) 8.6 MG tablet Take 1 tablet by mouth daily.     Yes Historical Provider, MD    Allergies:   Allergies  Allergen Reactions  . Sulfa Antibiotics     Social History:  does not have a smoking history on file. She  does not have any smokeless tobacco history on file. Her alcohol and drug histories not on file.  Family History: No family history on file.  Physical Exam: Filed Vitals:   01/21/11 0430 01/21/11 0530 01/21/11 0600 01/21/11 0630  BP: 165/62 162/60 156/55 156/53  Pulse: 81 87 89 87  Temp:      Resp:      SpO2: 94% 88% 90% 90%   General appearance: alert, delirious, mild distress and slowed mentation Head: Normocephalic, without obvious abnormality, atraumatic Eyes: conjunctivae/corneas clear. PERL Throat: lips, mucosa dry, and tongue and gums pale and dry Neck: no adenopathy, no carotid  bruit, no JVD, supple, symmetrical, trachea midline and thyroid not enlarged, symmetric, no tenderness/mass/nodules; Back: symmetric, no curvature. ROM normal. No CVA tenderness. Resp: clear to auscultation bilaterally Chest wall: no tenderness Cardio: regular rate and rhythm, S1, S2 normal, no murmur, click, rub or gallop GI: soft,diffuse soreness; extensive surgical scars bowel sounds normal; no masses,  no organomegaly Extremities: left LE externall rotated; arthrit deformities of hands knees, ankles Skin: papular rash left shoulder? Allergy; no ulcers Neurologic: Grossly normal   Labs on Admission:   [facility] 01/21/11 0309  NA 133*  K 4.7  CL 96  CO2 22  GLUCOSE 204*  BUN 23  CREATININE 1.19*  CALCIUM 10.1  MG --  PHOS --   No results found for this basename: AST:2,ALT:2,ALKPHOS:2,BILITOT:2,PROT:2,ALBUMIN:2 in the last 72 hours No results found for this basename: LIPASE:2,AMYLASE:2 in the last 72 hours  Basename 01/21/11 0309  WBC 16.7*  NEUTROABS 12.5*  HGB 12.3  HCT 37.0  MCV 91.4  PLT 305   No results found for this basename: CKTOTAL:3,CKMB:3,CKMBINDEX:3,TROPONINI:3 in the last 72 hours No results found for this basename: TSH,T4TOTAL,FREET3,T3FREE,THYROIDAB in the last 72 hours No results found for this basename: VITAMINB12:2,FOLATE:2,FERRITIN:2,TIBC:2,IRON:2,RETICCTPCT:2 in the last 72 hours  Radiological Exams on Admission: Dg Chest 1 View  01/21/2011  *RADIOLOGY REPORT*  Clinical Data: Hip fracture  CHEST - 1 VIEW  Comparison: 10/24/2010  Findings: Normal heart size and pulmonary vascularity. Emphysematous changes in the lungs.  Peribronchial thickening centrally consistent with chronic bronchitis.  No focal airspace consolidation.  No blunting of costophrenic angles.  Calcified aorta.  IMPRESSION: Emphysematous and chronic bronchitic changes.  No evidence of active pulmonary disease.  Original Report Authenticated By: [name], M.D.   Dg Hip Complete  Left  01/21/2011  *RADIOLOGY REPORT*  Clinical Data: Deformity after fall.  LEFT HIP - COMPLETE 2+ VIEW  Comparison: 07/23/2010  Findings: There is an acute fracture of the left femoral neck with superolateral displacement of the distal fracture fragment resulting in varus angulation.  No acetabular dislocation.  No focal bone lesions identified.  Fairly extensive vascular calcifications are noted.  Previous right hip arthroplasty.  The pelvis, SI joints, and symphysis pubis appear intact.  Per CMS PQRS reporting requirements (PQRS Measure 24): Given the patient's age of greater than 50 and the fracture site (hip, distal radius, or spine), the patient should be tested for osteoporosis using DXA, and the appropriate treatment considered based on the DXA results.  IMPRESSION: Acute fracture of the left femoral neck with varus angulation.  Original Report Authenticated By: [name], M.D.    Assessment/ Present on Admission .Hip fracture:  .Dehydration .Confusion .Diabetes mellitus type 2 in nonobese .Organic brain syndrome  PLAN: Admit this lady for hydration and consult with the orthopedic service. We'll discontinue metformin for now will continue Glucotrol for today but put her on a  sensitive sliding scale. She'll need supportive care while in hospital because of her organic brain syndrome and confusion.  At the moment there is no evidence of acute cardiac injury, and if her cardiac enzymes remain normal if her EKG shows no evidence of acute anesthetic and be cleared for appear of the right hip ASAP.  Other plans as per orders.   [name] 01/21/2011, 8:02 AM

## 2011-01-21 NOTE — Progress Notes (Signed)
Subjective: This 77 year old lady had a left hip fracture and was admitted today. Dr. Darreld Mclean, orthopedics, has already seen the patient and was anticipating operating on her tomorrow. However, I have had a discussion with the patient's daughter, who is the health care power of attorney for this demented lady and she wishes Dr. Fuller Canada  to be the orthopedic physician consulting. Also, the patient's daughter is wishing to avoid any surgery. The baseline performance status of this patient is that she is nonambulatory. She did not do well when she had right hip replacement previously. Due to her dementia, she is unable to tell me if she is in pain at the present time.           Physical Exam: Blood pressure 173/72, pulse 96, temperature 97.9 F (36.6 C), resp. rate 18, height 5\' 7"  (1.702 m), weight 59.6 kg (131 lb 6.3 oz), SpO2 92.00%. She does look systemically well. Heart sounds are present and sound regular. Lung fields are clear. Abdomen is soft and nontender.   Investigations: Results for orders placed during the hospital encounter of 01/21/11 (from the past 48 hour(s))  CBC     Status: Abnormal   Collection Time   01/21/11  3:09 AM      Component Value Range Comment   WBC 16.7 (*) 4.0 - 10.5 (K/uL)    RBC 4.05  3.87 - 5.11 (MIL/uL)    Hemoglobin 12.3  12.0 - 15.0 (g/dL)    HCT 16.1  09.6 - 04.5 (%)    MCV 91.4  78.0 - 100.0 (fL)    MCH 30.4  26.0 - 34.0 (pg)    MCHC 33.2  30.0 - 36.0 (g/dL)    RDW 40.9  81.1 - 91.4 (%)    Platelets 305  150 - 400 (K/uL)   DIFFERENTIAL     Status: Abnormal   Collection Time   01/21/11  3:09 AM      Component Value Range Comment   Neutrophils Relative 75  43 - 77 (%)    Neutro Abs 12.5 (*) 1.7 - 7.7 (K/uL)    Lymphocytes Relative 13  12 - 46 (%)    Lymphs Abs 2.2  0.7 - 4.0 (K/uL)    Monocytes Relative 7  3 - 12 (%)    Monocytes Absolute 1.2 (*) 0.1 - 1.0 (K/uL)    Eosinophils Relative 4  0 - 5 (%)    Eosinophils Absolute  0.7  0.0 - 0.7 (K/uL)    Basophils Relative 0  0 - 1 (%)    Basophils Absolute 0.1  0.0 - 0.1 (K/uL)   BASIC METABOLIC PANEL     Status: Abnormal   Collection Time   01/21/11  3:09 AM      Component Value Range Comment   Sodium 133 (*) 135 - 145 (mEq/L)    Potassium 4.7  3.5 - 5.1 (mEq/L)    Chloride 96  96 - 112 (mEq/L)    CO2 22  19 - 32 (mEq/L)    Glucose, Bld 204 (*) 70 - 99 (mg/dL)    BUN 23  6 - 23 (mg/dL)    Creatinine, Ser 7.82 (*) 0.50 - 1.10 (mg/dL)    Calcium 95.6  8.4 - 10.5 (mg/dL)    GFR calc non Af Amer 43 (*) >60 (mL/min)    GFR calc Af Amer 52 (*) >60 (mL/min)   PREPARE RBC (CROSSMATCH)     Status: Normal   Collection Time  01/21/11  8:21 AM      Component Value Range Comment   Order Confirmation ORDER PROCESSED BY BLOOD BANK     HEPATIC FUNCTION PANEL     Status: Abnormal   Collection Time   01/21/11  8:21 AM      Component Value Range Comment   Total Protein 7.0  6.0 - 8.3 (g/dL)    Albumin 3.0 (*) 3.5 - 5.2 (g/dL)    AST 11  0 - 37 (U/L)    ALT 13  0 - 35 (U/L)    Alkaline Phosphatase 125 (*) 39 - 117 (U/L)    Total Bilirubin 0.1 (*) 0.3 - 1.2 (mg/dL)    Bilirubin, Direct <1.6  0.0 - 0.3 (mg/dL)    Indirect Bilirubin NOT CALCULATED  0.3 - 0.9 (mg/dL)   MAGNESIUM     Status: Normal   Collection Time   01/21/11  8:21 AM      Component Value Range Comment   Magnesium 1.8  1.5 - 2.5 (mg/dL)   PHOSPHORUS     Status: Normal   Collection Time   01/21/11  8:21 AM      Component Value Range Comment   Phosphorus 3.5  2.3 - 4.6 (mg/dL)   APTT     Status: Normal   Collection Time   01/21/11  8:21 AM      Component Value Range Comment   aPTT 27  24 - 37 (seconds)   PROTIME-INR     Status: Normal   Collection Time   01/21/11  8:21 AM      Component Value Range Comment   Prothrombin Time 12.8  11.6 - 15.2 (seconds)    INR 0.94  0.00 - 1.49    TYPE AND SCREEN     Status: Normal   Collection Time   01/21/11  8:29 AM      Component Value Range Comment   ABO/RH(D)  A POS      Antibody Screen POS      Sample Expiration 01/24/2011     GLUCOSE, CAPILLARY     Status: Abnormal   Collection Time   01/21/11 11:26 AM      Component Value Range Comment   Glucose-Capillary 193 (*) 70 - 99 (mg/dL)    Comment 1 Notify RN      Comment 2 Documented in Chart      No results found for this or any previous visit (from the past 240 hour(s)).  Dg Chest 1 View  01/21/2011  *RADIOLOGY REPORT*  Clinical Data: Hip fracture  CHEST - 1 VIEW  Comparison: 10/24/2010  Findings: Normal heart size and pulmonary vascularity. Emphysematous changes in the lungs.  Peribronchial thickening centrally consistent with chronic bronchitis.  No focal airspace consolidation.  No blunting of costophrenic angles.  Calcified aorta.  IMPRESSION: Emphysematous and chronic bronchitic changes.  No evidence of active pulmonary disease.  Original Report Authenticated By: Marlon Pel, M.D.   Dg Hip Complete Left  01/21/2011  *RADIOLOGY REPORT*  Clinical Data: Deformity after fall.  LEFT HIP - COMPLETE 2+ VIEW  Comparison: 07/23/2010  Findings: There is an acute fracture of the left femoral neck with superolateral displacement of the distal fracture fragment resulting in varus angulation.  No acetabular dislocation.  No focal bone lesions identified.  Fairly extensive vascular calcifications are noted.  Previous right hip arthroplasty.  The pelvis, SI joints, and symphysis pubis appear intact.  Per CMS PQRS reporting requirements (PQRS Measure 24): Given the  patient's age of greater than 50 and the fracture site (hip, distal radius, or spine), the patient should be tested for osteoporosis using DXA, and the appropriate treatment considered based on the DXA results.  IMPRESSION: Acute fracture of the left femoral neck with varus angulation.  Original Report Authenticated By: Marlon Pel, M.D.      Medications: I have reviewed the patient's current medications.  Impression: 1. Left hip  fracture. 2. Moderate to severe dementia. 3. Diabetes mellitus type 2.     Plan: 1. Continue current treatment with analgesia and traction. 2. Patient's daughter will discuss directly with Dr. Fuller Canada regarding the management of her mother's hip fracture.     LOS: 0 days   Carder Yin C 01/21/2011, 12:30 PM

## 2011-01-22 ENCOUNTER — Encounter (HOSPITAL_COMMUNITY)
Admission: EM | Disposition: A | Discharge status: Other Acute Inpatient Hospital | Payer: Self-pay | Source: Home / Self Care | Attending: Internal Medicine

## 2011-01-22 ENCOUNTER — Encounter (HOSPITAL_COMMUNITY): Payer: Self-pay | Admitting: Orthopedic Surgery

## 2011-01-22 DIAGNOSIS — S72009A Fracture of unspecified part of neck of unspecified femur, initial encounter for closed fracture: Principal | ICD-10-CM

## 2011-01-22 LAB — BASIC METABOLIC PANEL
BUN: 16 mg/dL (ref 6–23)
CO2: 20 mEq/L (ref 19–32)
Chloride: 103 mEq/L (ref 96–112)
Creatinine, Ser: 1.01 mg/dL (ref 0.50–1.10)
Glucose, Bld: 140 mg/dL — ABNORMAL HIGH (ref 70–99)

## 2011-01-22 LAB — DIFFERENTIAL
Basophils Relative: 0 % (ref 0–1)
Lymphocytes Relative: 14 % (ref 12–46)
Monocytes Absolute: 1.2 10*3/uL — ABNORMAL HIGH (ref 0.1–1.0)
Monocytes Relative: 12 % (ref 3–12)
Neutro Abs: 6.9 10*3/uL (ref 1.7–7.7)

## 2011-01-22 LAB — CBC
HCT: 32.6 % — ABNORMAL LOW (ref 36.0–46.0)
MCH: 31.2 pg (ref 26.0–34.0)
MCHC: 34 g/dL (ref 30.0–36.0)
MCV: 91.6 fL (ref 78.0–100.0)
RDW: 13.1 % (ref 11.5–15.5)

## 2011-01-22 LAB — CARDIAC PANEL(CRET KIN+CKTOT+MB+TROPI): Troponin I: <0.3 ng/mL (ref <0.30)

## 2011-01-22 SURGERY — HEMIARTHROPLASTY, HIP, DIRECT ANTERIOR APPROACH, FOR FRACTURE
Anesthesia: Choice | Laterality: Right

## 2011-01-22 NOTE — Consult Note (Signed)
Reason for Consult:fractured hip  Referring Physician: Hospitalist Dr. [name] is an 77 y.o. female.  HPI: see hpi in chart and history taken from daughter: 89 yo s/p previous right bipolar, never walked after surgery, dementia worsened, cardiac disease. Prior to last surgery went into abnormal rhythm, was converted to sinus with medication and tolerated surgery well except for mental status. Larey Seat out of bed at [facility] fractured left hip (displaced left femoral Neck fracture)   Past Medical History  Diagnosis Date  . Falls frequently   . Cardiac dysrhythmia   . Anemia   . Organic brain syndrome   . Difficulty walking   . Acute renal failure   . Azotemia   . Hyperkalemia   . UTI (lower urinary tract infection)   . Syncope   . Hypertension     Past Surgical History  Procedure Date  . Joint replacement     right hip    History reviewed. No pertinent family history.  Social History:  reports that she has never smoked. She does not have any smokeless tobacco history on file. She reports that she does not drink alcohol or use illicit drugs.  Allergies:  Allergies  Allergen Reactions  . Sulfa Antibiotics     Medications: I have reviewed the patient's current medications.  Results for orders placed during the hospital encounter of 01/21/11 (from the past 48 hour(s))  CBC     Status: Abnormal   Collection Time   01/21/11  3:09 AM      Component Value Range Comment   WBC 16.7 (*) 4.0 - 10.5 (K/uL)    RBC 4.05  3.87 - 5.11 (MIL/uL)    Hemoglobin 12.3  12.0 - 15.0 (g/dL)    HCT 40.9  81.1 - 91.4 (%)    MCV 91.4  78.0 - 100.0 (fL)    MCH 30.4  26.0 - 34.0 (pg)    MCHC 33.2  30.0 - 36.0 (g/dL)    RDW 78.2  95.6 - 21.3 (%)    Platelets 305  150 - 400 (K/uL)   DIFFERENTIAL     Status: Abnormal   Collection Time   01/21/11  3:09 AM      Component Value Range Comment   Neutrophils Relative 75  43 - 77 (%)    Neutro Abs 12.5 (*) 1.7 - 7.7 (K/uL)    Lymphocytes  Relative 13  12 - 46 (%)    Lymphs Abs 2.2  0.7 - 4.0 (K/uL)    Monocytes Relative 7  3 - 12 (%)    Monocytes Absolute 1.2 (*) 0.1 - 1.0 (K/uL)    Eosinophils Relative 4  0 - 5 (%)    Eosinophils Absolute 0.7  0.0 - 0.7 (K/uL)    Basophils Relative 0  0 - 1 (%)    Basophils Absolute 0.1  0.0 - 0.1 (K/uL)   BASIC METABOLIC PANEL     Status: Abnormal   Collection Time   01/21/11  3:09 AM      Component Value Range Comment   Sodium 133 (*) 135 - 145 (mEq/L)    Potassium 4.7  3.5 - 5.1 (mEq/L)    Chloride 96  96 - 112 (mEq/L)    CO2 22  19 - 32 (mEq/L)    Glucose, Bld 204 (*) 70 - 99 (mg/dL)    BUN 23  6 - 23 (mg/dL)    Creatinine, Ser 0.86 (*) 0.50 - 1.10 (mg/dL)  Calcium 10.1  8.4 - 10.5 (mg/dL)    GFR calc non Af Amer 43 (*) >60 (mL/min)    GFR calc Af Amer 52 (*) >60 (mL/min)   PREPARE RBC (CROSSMATCH)     Status: Normal   Collection Time   01/21/11  8:21 AM      Component Value Range Comment   Order Confirmation ORDER PROCESSED BY BLOOD BANK     HEPATIC FUNCTION PANEL     Status: Abnormal   Collection Time   01/21/11  8:21 AM      Component Value Range Comment   Total Protein 7.0  6.0 - 8.3 (g/dL)    Albumin 3.0 (*) 3.5 - 5.2 (g/dL)    AST 11  0 - 37 (U/L)    ALT 13  0 - 35 (U/L)    Alkaline Phosphatase 125 (*) 39 - 117 (U/L)    Total Bilirubin 0.1 (*) 0.3 - 1.2 (mg/dL)    Bilirubin, Direct <2.1  0.0 - 0.3 (mg/dL)    Indirect Bilirubin NOT CALCULATED  0.3 - 0.9 (mg/dL)   MAGNESIUM     Status: Normal   Collection Time   01/21/11  8:21 AM      Component Value Range Comment   Magnesium 1.8  1.5 - 2.5 (mg/dL)   PHOSPHORUS     Status: Normal   Collection Time   01/21/11  8:21 AM      Component Value Range Comment   Phosphorus 3.5  2.3 - 4.6 (mg/dL)   APTT     Status: Normal   Collection Time   01/21/11  8:21 AM      Component Value Range Comment   aPTT 27  24 - 37 (seconds)   PROTIME-INR     Status: Normal   Collection Time   01/21/11  8:21 AM      Component Value  Range Comment   Prothrombin Time 12.8  11.6 - 15.2 (seconds)    INR 0.94  0.00 - 1.49    TSH     Status: Normal   Collection Time   01/21/11  8:21 AM      Component Value Range Comment   TSH 1.219  0.350 - 4.500 (uIU/mL)   HEMOGLOBIN A1C     Status: Abnormal   Collection Time   01/21/11  8:21 AM      Component Value Range Comment   Hemoglobin A1C 8.3 (*) <5.7 (%)    Mean Plasma Glucose 192 (*) <117 (mg/dL)   TYPE AND SCREEN     Status: Normal (Preliminary result)   Collection Time   01/21/11  8:29 AM      Component Value Range Comment   ABO/RH(D) A POS      Antibody Screen POS      Sample Expiration 01/24/2011      Antibody Identification ANTI-E      DAT, IgG NEG      PT AG Type NEGATIVE FOR E ANTIGEN      Unit Number [id number]      Blood Component Type RED CELLS,LR      Unit division 00      Status of Unit ALLOCATED      Donor AG Type NEGATIVE FOR E ANTIGEN      Transfusion Status OK TO TRANSFUSE      Crossmatch Result COMPATIBLE      Unit Number [id number]      Blood Component Type RED CELLS,LR      Unit  division 00      Status of Unit ALLOCATED      Donor AG Type NEGATIVE FOR E ANTIGEN      Transfusion Status OK TO TRANSFUSE      Crossmatch Result COMPATIBLE     GLUCOSE, CAPILLARY     Status: Abnormal   Collection Time   01/21/11 11:26 AM      Component Value Range Comment   Glucose-Capillary 193 (*) 70 - 99 (mg/dL)    Comment 1 Notify RN      Comment 2 Documented in Chart     SURGICAL PCR SCREEN     Status: Abnormal   Collection Time   01/21/11 12:00 PM      Component Value Range Comment   MRSA, PCR NEGATIVE  NEGATIVE     Staphylococcus aureus POSITIVE (*) NEGATIVE    URINALYSIS, ROUTINE W REFLEX MICROSCOPIC     Status: Abnormal   Collection Time   01/21/11 12:35 PM      Component Value Range Comment   Color, Urine YELLOW  YELLOW     Appearance HAZY (*) CLEAR     Specific Gravity, Urine 1.020  1.005 - 1.030     pH 6.0  5.0 - 8.0     Glucose, UA NEGATIVE   NEGATIVE (mg/dL)    Hgb urine dipstick NEGATIVE  NEGATIVE     Bilirubin Urine NEGATIVE  NEGATIVE     Ketones, ur NEGATIVE  NEGATIVE (mg/dL)    Protein, ur NEGATIVE  NEGATIVE (mg/dL)    Urobilinogen, UA 0.2  0.0 - 1.0 (mg/dL)    Nitrite POSITIVE (*) NEGATIVE     Leukocytes, UA NEGATIVE  NEGATIVE    URINE MICROSCOPIC-ADD ON     Status: Abnormal   Collection Time   01/21/11 12:35 PM      Component Value Range Comment   Squamous Epithelial / LPF FEW (*) RARE     WBC, UA 3-6  <3 (WBC/hpf)    RBC / HPF 0-2  <3 (RBC/hpf)    Bacteria, UA MANY (*) RARE    CARDIAC PANEL(CRET KIN+CKTOT+MB+TROPI)     Status: Abnormal   Collection Time   01/21/11  3:26 PM      Component Value Range Comment   Total CK 188 (*) 7 - 177 (U/L)    CK, MB 2.2  0.3 - 4.0 (ng/mL)    Troponin I <0.30  <0.30 (ng/mL)    Relative Index 1.2  0.0 - 2.5    GLUCOSE, CAPILLARY     Status: Abnormal   Collection Time   01/21/11  4:11 PM      Component Value Range Comment   Glucose-Capillary 128 (*) 70 - 99 (mg/dL)    Comment 1 Notify RN      Comment 2 Documented in Chart     GLUCOSE, CAPILLARY     Status: Abnormal   Collection Time   01/21/11  9:13 PM      Component Value Range Comment   Glucose-Capillary 132 (*) 70 - 99 (mg/dL)   CARDIAC PANEL(CRET KIN+CKTOT+MB+TROPI)     Status: Normal   Collection Time   01/22/11 12:00 AM      Component Value Range Comment   Total CK 163  7 - 177 (U/L)    CK, MB 1.8  0.3 - 4.0 (ng/mL)    Troponin I <0.30  <0.30 (ng/mL)    Relative Index 1.1  0.0 - 2.5    DIFFERENTIAL     Status:  Abnormal   Collection Time   01/22/11  4:41 AM      Component Value Range Comment   Neutrophils Relative 69  43 - 77 (%)    Neutro Abs 6.9  1.7 - 7.7 (K/uL)    Lymphocytes Relative 14  12 - 46 (%)    Lymphs Abs 1.4  0.7 - 4.0 (K/uL)    Monocytes Relative 12  3 - 12 (%)    Monocytes Absolute 1.2 (*) 0.1 - 1.0 (K/uL)    Eosinophils Relative 5  0 - 5 (%)    Eosinophils Absolute 0.5  0.0 - 0.7 (K/uL)     Basophils Relative 0  0 - 1 (%)    Basophils Absolute 0.0  0.0 - 0.1 (K/uL)   BASIC METABOLIC PANEL     Status: Abnormal   Collection Time   01/22/11  4:41 AM      Component Value Range Comment   Sodium 134 (*) 135 - 145 (mEq/L)    Potassium 4.9  3.5 - 5.1 (mEq/L)    Chloride 103  96 - 112 (mEq/L)    CO2 20  19 - 32 (mEq/L)    Glucose, Bld 140 (*) 70 - 99 (mg/dL)    BUN 16  6 - 23 (mg/dL)    Creatinine, Ser 1.61  0.50 - 1.10 (mg/dL)    Calcium 8.8  8.4 - 10.5 (mg/dL)    GFR calc non Af Amer 52 (*) >60 (mL/min)    GFR calc Af Amer >60  >60 (mL/min)   CBC     Status: Abnormal   Collection Time   01/22/11  4:41 AM      Component Value Range Comment   WBC 10.1  4.0 - 10.5 (K/uL)    RBC 3.56 (*) 3.87 - 5.11 (MIL/uL)    Hemoglobin 11.1 (*) 12.0 - 15.0 (g/dL)    HCT 09.6 (*) 04.5 - 46.0 (%)    MCV 91.6  78.0 - 100.0 (fL)    MCH 31.2  26.0 - 34.0 (pg)    MCHC 34.0  30.0 - 36.0 (g/dL)    RDW 40.9  81.1 - 91.4 (%)    Platelets 240  150 - 400 (K/uL)     Dg Chest 1 View  01/21/2011  *RADIOLOGY REPORT*  Clinical Data: Hip fracture  CHEST - 1 VIEW  Comparison: 10/24/2010  Findings: Normal heart size and pulmonary vascularity. Emphysematous changes in the lungs.  Peribronchial thickening centrally consistent with chronic bronchitis.  No focal airspace consolidation.  No blunting of costophrenic angles.  Calcified aorta.  IMPRESSION: Emphysematous and chronic bronchitic changes.  No evidence of active pulmonary disease.  Original Report Authenticated By: Marlon Pel, M.D.   Dg Hip Complete Left  01/21/2011  *RADIOLOGY REPORT*  Clinical Data: Deformity after fall.  LEFT HIP - COMPLETE 2+ VIEW  Comparison: 07/23/2010  Findings: There is an acute fracture of the left femoral neck with superolateral displacement of the distal fracture fragment resulting in varus angulation.  No acetabular dislocation.  No focal bone lesions identified.  Fairly extensive vascular calcifications are noted.   Previous right hip arthroplasty.  The pelvis, SI joints, and symphysis pubis appear intact.  Per CMS PQRS reporting requirements (PQRS Measure 24): Given the patient's age of greater than 50 and the fracture site (hip, distal radius, or spine), the patient should be tested for osteoporosis using DXA, and the appropriate treatment considered based on the DXA results.  IMPRESSION: Acute fracture of the  left femoral neck with varus angulation.  Original Report Authenticated By: Marlon Pel, M.D.    ROS Blood pressure 170/70, pulse 86, temperature 98.5 F (36.9 C), resp. rate 16, height 5\' 7"  (1.702 m), weight 59.6 kg (131 lb 6.3 oz), SpO2 95.00%. Physical Exam  Assessment/Plan: The patient has a left femoral neck fracture displaced, she does not walk but she does transfer bed to chair. She has COPD and history of cardiac arrhythmia. I have spoken with her daughter and we have decided to get consults from the pulmonologist and the cardiologist prior to deciding whether or not surgery will be performed. I have reviewed the risks and benefits of each treatment option and the patient's daughter seems to understand that surgical treatment and nonsurgical treatment both have advantages and disadvantages. After the consults are obtained we will decide whether surgery is in the patient's best interest.  Fuller Canada 01/22/2011, 7:30 AM

## 2011-01-22 NOTE — Progress Notes (Signed)
Subjective: No active issues. Daughter present at bedside. Still awaiting cardiology and pulmonary recommendations.  Objective: Vital signs in last 24 hours: Temp:  [98.2 F (36.8 C)-98.6 F (37 C)] 98.2 F (36.8 C) (08/15 1400) Pulse Rate:  [86-95] 94  (08/15 1400) Resp:  [16-20] 18  (08/15 1400) BP: (134-170)/(66-70) 134/67 mmHg (08/15 1400) SpO2:  [88 %-95 %] 93 % (08/15 1400) Weight change:  Last BM Date:  (unknown)  Intake/Output from previous day: 08/14 0701 - 08/15 0700 In: 1956.7 [I.V.:1956.7] Out: 500 [Urine:500]   Physical Exam: General: Alert, awake, in no acute distress. HEENT: No bruits, no goiter. Heart: Regular rate and rhythm, without murmurs, rubs, gallops. Lungs: Clear to auscultation bilaterally. Abdomen: Soft, nontender, nondistended, positive bowel sounds. Extremities: No clubbing cyanosis or edema with positive pedal pulses. Neuro: Grossly intact, nonfocal.    Lab Results:  [facility] 01/22/11 0441 01/21/11 0309  WBC 10.1 16.7*  HGB 11.1* 12.3  HCT 32.6* 37.0  PLT 240 305    Basename 01/22/11 0441 01/21/11 0309  NA 134* 133*  K 4.9 4.7  CL 103 96  CO2 20 22  GLUCOSE 140* 204*  BUN 16 23  CREATININE 1.01 1.19*  CALCIUM 8.8 10.1    Studies/Results: Dg Chest 1 View  01/21/2011  *RADIOLOGY REPORT*  Clinical Data: Hip fracture  CHEST - 1 VIEW  Comparison: 10/24/2010  Findings: Normal heart size and pulmonary vascularity. Emphysematous changes in the lungs.  Peribronchial thickening centrally consistent with chronic bronchitis.  No focal airspace consolidation.  No blunting of costophrenic angles.  Calcified aorta.  IMPRESSION: Emphysematous and chronic bronchitic changes.  No evidence of active pulmonary disease.  Original Report Authenticated By: Marlon Pel, M.D.   Dg Hip Complete Left  01/21/2011  *RADIOLOGY REPORT*  Clinical Data: Deformity after fall.  LEFT HIP - COMPLETE 2+ VIEW  Comparison: 07/23/2010  Findings: There is an acute  fracture of the left femoral neck with superolateral displacement of the distal fracture fragment resulting in varus angulation.  No acetabular dislocation.  No focal bone lesions identified.  Fairly extensive vascular calcifications are noted.  Previous right hip arthroplasty.  The pelvis, SI joints, and symphysis pubis appear intact.  Per CMS PQRS reporting requirements (PQRS Measure 24): Given the patient's age of greater than 50 and the fracture site (hip, distal radius, or spine), the patient should be tested for osteoporosis using DXA, and the appropriate treatment considered based on the DXA results.  IMPRESSION: Acute fracture of the left femoral neck with varus angulation.  Original Report Authenticated By: Marlon Pel, M.D.    Medications: I have reviewed the patient's current medications.  Assessment/Plan:  1.  Hip fracture: Daughter and Dr. Romeo Apple and are awaiting cardiology and pulmonary recommendations prior to deciding on surgery versus no surgery for her hip fracture. 2.  Dehydration 3.  Confusion 4.  Diabetes mellitus type 2 in nonobese 5.  Organic brain syndrome 6. A. fib: Rate controlled. Not chronically anticoagulated the   LOS: 1 day   [name],[name] 01/22/2011, 3:55 PM

## 2011-01-23 ENCOUNTER — Telehealth: Payer: Self-pay | Admitting: Orthopedic Surgery

## 2011-01-23 LAB — GLUCOSE, CAPILLARY

## 2011-01-23 LAB — URINE CULTURE: Colony Count: >=100000

## 2011-01-23 MED ORDER — METOPROLOL TARTRATE 25 MG PO TABS: 12.5000 mg | ORAL_TABLET | Freq: Once | ORAL | Status: DC

## 2011-01-23 MED ORDER — IPRATROPIUM BROMIDE 0.02 % IN SOLN
RESPIRATORY_TRACT | Status: AC
  Administered 2011-01-23: 0.5 mg via RESPIRATORY_TRACT
  Filled 2011-01-23: qty 2.5

## 2011-01-23 MED ORDER — SODIUM CHLORIDE 0.9 % IJ SOLN
INTRAMUSCULAR | Status: AC
  Filled 2011-01-23: qty 10

## 2011-01-23 MED ORDER — METOPROLOL TARTRATE 25 MG PO TABS
25.0000 mg | ORAL_TABLET | Freq: Two times a day (BID) | ORAL | Status: DC
  Administered 2011-01-23: 25 mg via ORAL
  Filled 2011-01-23 (×2): qty 1

## 2011-01-23 MED ORDER — BIOTENE DRY MOUTH MT LIQD
Freq: Two times a day (BID) | OROMUCOSAL | Status: DC
  Administered 2011-01-23 (×2): via OROMUCOSAL

## 2011-01-23 MED ORDER — IPRATROPIUM BROMIDE 0.02 % IN SOLN
0.5000 mg | RESPIRATORY_TRACT | Status: DC
  Administered 2011-01-23 – 2011-01-24 (×7): 0.5 mg via RESPIRATORY_TRACT
  Filled 2011-01-23 (×2): qty 2.5

## 2011-01-23 MED ORDER — ALBUTEROL SULFATE (5 MG/ML) 0.5% IN NEBU
2.5000 mg | INHALATION_SOLUTION | RESPIRATORY_TRACT | Status: DC
  Administered 2011-01-23 – 2011-01-24 (×5): 2.5 mg via RESPIRATORY_TRACT
  Filled 2011-01-23 (×2): qty 0.5

## 2011-01-23 MED ORDER — ALBUTEROL SULFATE (5 MG/ML) 0.5% IN NEBU
INHALATION_SOLUTION | RESPIRATORY_TRACT | Status: AC
  Administered 2011-01-23: 2.5 mg via RESPIRATORY_TRACT
  Filled 2011-01-23: qty 0.5

## 2011-01-23 NOTE — Telephone Encounter (Signed)
Mrs. Freedman's daughter, Venetia Night said the Cardiologist from [place] said the EKG looked good,has ordered an US of the heart and if that is OK she can be transferred to [facility] once you write the order.  Said she prefers Dr. Mckinley Jewel with [facility]. Jasmine December can be reached at [phone] or [phone]

## 2011-01-23 NOTE — Progress Notes (Signed)
*  PRELIMINARY RESULTS* Echocardiogram 2D Echocardiogram has been performed.  [name] 01/23/2011, 4:21 PM

## 2011-01-23 NOTE — Progress Notes (Signed)
Dr. Tresa Endo saw patient for consult, dictation # [id number].

## 2011-01-23 NOTE — Consult Note (Signed)
Consult requested by: Dr. Romeo Apple Consult requested for COPD:  HPI: This is an 77 year old who lives at a nursing home and who has multiple medical problems. She was admitted to the hospital with a fractured hip. She has diabetes hypertension atrial fibrillation dementia and COPD. She is apparently basically bedridden at baseline. She does not appear to be significantly limited by COPD but she is not active at all.  Past Medical History  Diagnosis Date  . Falls frequently   . Cardiac dysrhythmia   . Anemia   . Organic brain syndrome   . Difficulty walking   . Acute renal failure   . Azotemia   . Hyperkalemia   . UTI (lower urinary tract infection)   . Syncope   . Hypertension      History reviewed. No pertinent family history. a known history of COPD in the family.   History   Social History  . Marital Status: Widowed    Spouse Name: N/A    Number of Children: N/A  . Years of Education: N/A   Social History Main Topics  . Smoking status: Never Smoker   . Smokeless tobacco: None  . Alcohol Use: No     unable to assess  . Drug Use: No  . Sexually Active: No   Other Topics Concern  . None   Social History Narrative  . None     ROS: Unable to obtain because of her dementia    Objective: Vital signs in last 24 hours: Temp:  [97.4 F (36.3 C)-98.4 F (36.9 C)] 98 F (36.7 C) (08/16 0625) Pulse Rate:  [82-112] 88  (08/16 0651) Resp:  [17-19] 18  (08/16 0651) BP: (134-158)/(61-72) 144/61 mmHg (08/16 0651) SpO2:  [86 %-95 %] 95 % (08/16 0651) FiO2 (%):  [2 %] 2 % (08/15 2159) Weight:  [93.3 kg (205 lb 11 oz)] 205 lb 11 oz (93.3 kg) (08/16 0625) Weight change: 33.7 kg (74 lb 4.7 oz) Last BM Date:  (Unknown)  Intake/Output from previous day: 08/15 0701 - 08/16 0700 In: 2423 [P.O.:180; I.V.:2243] Out: 1675 [Urine:1675]  PHYSICAL EXAM She is confused. She looks fairly comfortable. Her HEENT shows her pupils are reactive her mucous membranes are fairly  moist. Her neck is supple without masses bruits or JVD. Her chest shows some rhonchi but no wheezes. She is not dyspneic at rest. Her heart is somewhat irregular but I can tell she is in atrial fibrillation at this point. Her abdomen is soft without masses her bowel sounds are present and active she is in traction and her left leg. Percent on nervous system examination shows that she is responsive but very confused  Lab Results: Basic Metabolic Panel:  Basename 01/22/11 0441 01/21/11 0821 01/21/11 0309  NA 134* -- 133*  K 4.9 -- 4.7  CL 103 -- 96  CO2 20 -- 22  GLUCOSE 140* -- 204*  BUN 16 -- 23  CREATININE 1.01 -- 1.19*  CALCIUM 8.8 -- 10.1  MG -- 1.8 --  PHOS -- 3.5 --   Liver Function Tests:  [facility] 01/21/11 0821  AST 11  ALT 13  ALKPHOS 125*  BILITOT 0.1*  PROT 7.0  ALBUMIN 3.0*   No results found for this basename: LIPASE:2,AMYLASE:2 in the last 72 hours CBC:  Basename 01/22/11 0441 01/21/11 0309  WBC 10.1 16.7*  NEUTROABS 6.9 12.5*  HGB 11.1* 12.3  HCT 32.6* 37.0  MCV 91.6 91.4  PLT 240 305   Cardiac Enzymes:  Basename 01/22/11  01/21/11 1526  CKTOTAL 163 188*  CKMB 1.8 2.2  CKMBINDEX -- --  TROPONINI <0.30 <0.30   BNP: No results found for this basename: POCBNP:3 in the last 72 hours D-Dimer: No results found for this basename: DDIMER:2 in the last 72 hours CBG:  Basename 01/23/11 0727 01/22/11 2205 01/22/11 1638 01/22/11 1148 01/22/11 0741 01/21/11 2113  GLUCAP 133* 141* 173* 141* 158* 132*   Hemoglobin A1C:  Basename 01/21/11 0821  HGBA1C 8.3*   Fasting Lipid Panel: No results found for this basename: CHOL,HDL,LDLCALC,TRIG,CHOLHDL,LDLDIRECT in the last 72 hours Thyroid Function Tests:  Basename 01/21/11 0821  TSH 1.219  T4TOTAL --  FREET4 --  T3FREE --  THYROIDAB --   Anemia Panel: No results found for this basename: VITAMINB12,FOLATE,FERRITIN,TIBC,IRON,RETICCTPCT in the last 72 hours Urine Drug Screen:  Urinalysis:  Misc.  Labs:   ABGS: No results found for this basename: PHART,PCO2,PO2ART,TCO2,HCO3 in the last 72 hours   MICROBIOLOGY: Recent Results (from the past 240 hour(s))  SURGICAL PCR SCREEN     Status: Abnormal   Collection Time   01/21/11 12:00 PM      Component Value Range Status Comment   MRSA, PCR NEGATIVE  NEGATIVE  Final    Staphylococcus aureus POSITIVE (*) NEGATIVE  Final   URINE CULTURE     Status: Normal (Preliminary result)   Collection Time   01/21/11 12:35 PM      Component Value Range Status Comment   Specimen Description URINE, CATHETERIZED   Final    Special Requests NONE   Final    Setup Time [id number]   Final    Colony Count >=100,000 COLONIES/ML   Final    Culture GRAM NEGATIVE RODS   Final    Report Status PENDING   Incomplete     Studies/Results: No results found.  Medications:  Scheduled:   . antiseptic oral rinse   Mouth Rinse q12n4p  . ceFAZolin (ANCEF) IV  1 g Intravenous Once  . Chlorhexidine Gluconate Cloth  6 each Topical Daily  . diltiazem  180 mg Oral Daily  . docusate sodium  100 mg Oral BID  . glipiZIDE  2.5 mg Oral Daily  . guaiFENesin  1,200 mg Oral BID  . insulin aspart  0-5 Units Subcutaneous QHS  . insulin aspart  0-9 Units Subcutaneous TID WC  . lisinopril  5 mg Oral Daily  . memantine  10 mg Oral BID  . metoprolol tartrate  12.5 mg Oral BID  . mupirocin  1 application Nasal BID  . povidone-iodine   Topical Once  . propafenone  225 mg Oral BID   Continuous:   . 0.9 % NaCl with KCl 20 mEq / L 100 mL/hr at 01/22/11 2358   WJX:[id number], acetaminophen, bisacodyl, HYDROmorphone, ondansetron (ZOFRAN) IV, ondansetron, sodium phosphate, traZODone  Assesment: She has COPD which is apparently fairly mild. He has a hip fracture and I think these thoughts are have that repaired because she does transfer although she is nonambulatory. I do not think her COPD we'll keep her from having surgery. Principal Problem:  *Hip fracture Active  Problems:  Dehydration  Confusion  Diabetes mellitus type 2 in nonobese  Organic brain syndrome    Plan: She is cleared for surgery from a strictly pulmonary point of view.    LOS: 2 days   Esther Broyles L 01/23/2011, 9:09 AM

## 2011-01-23 NOTE — Telephone Encounter (Signed)
Called her back

## 2011-01-23 NOTE — Telephone Encounter (Signed)
Dr. Romeo Apple called her

## 2011-01-23 NOTE — Telephone Encounter (Signed)
Venetia Night wants you to call her about her mother, [name].  Has questions about how and when the transfer to [facility] is to be handled and about the other doctor's information being relayed to you.   Her # [phone]

## 2011-01-23 NOTE — Progress Notes (Signed)
Subjective: Lying in bed. Awake. No family present at bedside.  Objective: Vital signs in last 24 hours: Temp:  [97.4 F (36.3 C)-98.2 F (36.8 C)] 98.2 F (36.8 C) (08/16 1023) Pulse Rate:  [82-112] 99  (08/16 1023) Resp:  [16-19] 16  (08/16 1023) BP: (134-199)/(61-73) 199/73 mmHg (08/16 1023) SpO2:  [86 %-95 %] 95 % (08/16 1051) FiO2 (%):  [2 %] 2 % (08/15 2159) Weight:  [93.3 kg (205 lb 11 oz)] 205 lb 11 oz (93.3 kg) (08/16 0625) Weight change: 33.7 kg (74 lb 4.7 oz) Last BM Date:  (Unknown)  Intake/Output from previous day: 08/15 0701 - 08/16 0700 In: 2423 [P.O.:180; I.V.:2243] Out: 1675 [Urine:1675]   Physical Exam: General: Awake,  in no acute distress. HEENT: No bruits, no goiter. Heart: Regular rate and rhythm, without murmurs, rubs, gallops. Lungs: Clear to auscultation bilaterally. Abdomen: Soft, nontender, nondistended, positive bowel sounds. Extremities: No clubbing cyanosis or edema with positive pedal pulses. Neuro: Grossly intact, nonfocal.    Lab Results:  Basename 01/22/11 0441 01/21/11 0309  WBC 10.1 16.7*  HGB 11.1* 12.3  HCT 32.6* 37.0  PLT 240 305    Basename 01/22/11 0441 01/21/11 0309  NA 134* 133*  K 4.9 4.7  CL 103 96  CO2 20 22  GLUCOSE 140* 204*  BUN 16 23  CREATININE 1.01 1.19*  CALCIUM 8.8 10.1    Studies/Results: No results found.  Medications: Scheduled Meds:   . ipratropium  0.5 mg Nebulization Q4H   And  . albuterol  2.5 mg Nebulization Q4H  . antiseptic oral rinse   Mouth Rinse q12n4p  . ceFAZolin (ANCEF) IV  1 g Intravenous Once  . Chlorhexidine Gluconate Cloth  6 each Topical Daily  . diltiazem  180 mg Oral Daily  . docusate sodium  100 mg Oral BID  . glipiZIDE  2.5 mg Oral Daily  . guaiFENesin  1,200 mg Oral BID  . insulin aspart  0-5 Units Subcutaneous QHS  . insulin aspart  0-9 Units Subcutaneous TID WC  . lisinopril  5 mg Oral Daily  . memantine  10 mg Oral BID  . metoprolol tartrate  12.5 mg Oral BID    . mupirocin  1 application Nasal BID  . povidone-iodine   Topical Once  . propafenone  225 mg Oral BID   Continuous Infusions:   . 0.9 % NaCl with KCl 20 mEq / L 100 mL/hr at 01/22/11 2358   PRN Meds:.acetaminophen, acetaminophen, bisacodyl, HYDROmorphone, ondansetron (ZOFRAN) IV, ondansetron, sodium phosphate, traZODone  Assessment/Plan:  1.  Hip fracture: Per pulmonary okay to proceed with surgery. Still awaiting cardiology evaluation as main concern with hip surgery is that with prior hip surgery she had significant arrhythmias beacuse she was taken off her Rythmol. Still no decision has been made in regards to whether she will have surgery or not and to whether this can be done here or she will need to be transferred to [facility].  2.  Dehydration 3.  Confusion 4.  Diabetes mellitus type 2 in nonobese 5.  Organic brain syndrome    LOS: 2 days   [name],[name] 01/23/2011, 10:56 AM

## 2011-01-24 ENCOUNTER — Encounter (HOSPITAL_COMMUNITY)
Admission: EM | Disposition: A | Discharge status: Other Acute Inpatient Hospital | Payer: Self-pay | Source: Home / Self Care | Attending: Internal Medicine

## 2011-01-24 ENCOUNTER — Inpatient Hospital Stay (HOSPITAL_COMMUNITY)
Admission: AD | Admit: 2011-01-24 | Discharge: 2011-01-30 | DRG: 469 | Disposition: A | Discharge status: Skilled Nursing Facility | Payer: Medicare HMO | Source: Other Acute Inpatient Hospital | Attending: Internal Medicine | Admitting: Internal Medicine

## 2011-01-24 DIAGNOSIS — J449 Chronic obstructive pulmonary disease, unspecified: Secondary | ICD-10-CM

## 2011-01-24 DIAGNOSIS — I482 Chronic atrial fibrillation, unspecified: Secondary | ICD-10-CM

## 2011-01-24 LAB — CBC
Hemoglobin: 9.5 g/dL — ABNORMAL LOW (ref 12.0–15.0)
MCH: 30.6 pg (ref 26.0–34.0)
MCV: 91.6 fL (ref 78.0–100.0)
RBC: 3.1 MIL/uL — ABNORMAL LOW (ref 3.87–5.11)

## 2011-01-24 LAB — BASIC METABOLIC PANEL
CO2: 18 mEq/L — ABNORMAL LOW (ref 19–32)
GFR calc non Af Amer: 51 mL/min — ABNORMAL LOW (ref >60)
Glucose, Bld: 143 mg/dL — ABNORMAL HIGH (ref 70–99)
Potassium: 4.9 mEq/L (ref 3.5–5.1)
Sodium: 132 mEq/L — ABNORMAL LOW (ref 135–145)

## 2011-01-24 LAB — GLUCOSE, CAPILLARY
Glucose-Capillary: 131 mg/dL — ABNORMAL HIGH (ref 70–99)
Glucose-Capillary: 191 mg/dL — ABNORMAL HIGH (ref 70–99)

## 2011-01-24 SURGERY — AUSTIN MOORE HIP REPLACEMENT
Anesthesia: Spinal | Laterality: Left

## 2011-01-24 MED ORDER — CALCIUM CARBONATE 1250 (500 CA) MG PO TABS: 1250.0000 mg | ORAL_TABLET | Freq: Every day | ORAL | Status: DC

## 2011-01-24 MED ORDER — FAMOTIDINE 20 MG PO TABS: 20.0000 mg | ORAL_TABLET | Freq: Every day | ORAL | Status: DC

## 2011-01-24 MED ORDER — ALBUTEROL SULFATE (5 MG/ML) 0.5% IN NEBU
2.5000 mg | INHALATION_SOLUTION | Freq: Four times a day (QID) | RESPIRATORY_TRACT | Status: DC | PRN
  Administered 2011-01-24: 2.5 mg via RESPIRATORY_TRACT

## 2011-01-24 MED ORDER — ALBUTEROL SULFATE (5 MG/ML) 0.5% IN NEBU
INHALATION_SOLUTION | RESPIRATORY_TRACT | Status: AC
  Administered 2011-01-24: 2.5 mg via RESPIRATORY_TRACT
  Filled 2011-01-24: qty 0.5

## 2011-01-24 MED ORDER — IPRATROPIUM BROMIDE 0.02 % IN SOLN
RESPIRATORY_TRACT | Status: AC
  Administered 2011-01-24: 0.5 mg via RESPIRATORY_TRACT
  Filled 2011-01-24: qty 2.5

## 2011-01-24 MED ORDER — IPRATROPIUM-ALBUTEROL 0.5-2.5 (3) MG/3ML IN SOLN: 3.0000 mL | Freq: Four times a day (QID) | RESPIRATORY_TRACT | Status: DC | PRN

## 2011-01-24 MED ORDER — SENNA 8.6 MG PO TABS: 2.0000 | ORAL_TABLET | Freq: Two times a day (BID) | ORAL | Status: DC

## 2011-01-24 MED ORDER — GUAIFENESIN ER 600 MG PO TB12: 600.0000 mg | ORAL_TABLET | Freq: Two times a day (BID) | ORAL | Status: DC

## 2011-01-24 MED ORDER — IPRATROPIUM BROMIDE 0.02 % IN SOLN: 0.5000 mg | Freq: Four times a day (QID) | RESPIRATORY_TRACT | Status: DC | PRN

## 2011-01-24 MED ORDER — DILTIAZEM HCL ER COATED BEADS 180 MG PO CP24: 360.0000 mg | ORAL_CAPSULE | Freq: Every day | ORAL | Status: DC

## 2011-01-24 NOTE — Progress Notes (Signed)
Subjective: She is overall about the same. I have discussed her situation with Dr. Romeo Apple in she's been transferred to [facility] at her family's request.  Objective: Vital signs in last 24 hours: Temp:  [97.9 F (36.6 C)-99.1 F (37.3 C)] 98.1 F (36.7 C) (08/17 0542) Pulse Rate:  [79-100] 98  (08/17 0542) Resp:  [16-24] 19  (08/17 0542) BP: (163-199)/(68-73) 163/73 mmHg (08/17 0542) SpO2:  [77 %-97 %] 95 % (08/17 0542) FiO2 (%):  [21 %] 21 % (08/17 0138) Weight:  [93.3 kg (205 lb 11 oz)] 205 lb 11 oz (93.3 kg) (08/17 0546) Weight change: 0 kg (0 lb) Last BM Date:  (Unknown)  Intake/Output from previous day: 08/16 0701 - 08/17 0700 In: 1158.3 [P.O.:150; I.V.:1008.3] Out: 900 [Urine:900]  PHYSICAL EXAM General appearance: alert, uncooperative and Confused Resp: rhonchi bilaterally Cardio: irregularly irregular rhythm GI: soft, non-tender; bowel sounds normal; no masses,  no organomegaly Extremities: She is in traction for her hip fracture  Lab Results:    Basic Metabolic Panel:  Basename 01/24/11 0530 01/22/11 0441  NA 132* 134*  K 4.9 4.9  CL 103 103  CO2 18* 20  GLUCOSE 143* 140*  BUN 12 16  CREATININE 1.03 1.01  CALCIUM 8.4 8.8  MG -- --  PHOS -- --   Liver Function Tests: No results found for this basename: AST:2,ALT:2,ALKPHOS:2,BILITOT:2,PROT:2,ALBUMIN:2 in the last 72 hours No results found for this basename: LIPASE:2,AMYLASE:2 in the last 72 hours CBC:  Basename 01/24/11 0530 01/22/11 0441  WBC 8.9 10.1  NEUTROABS -- 6.9  HGB 9.5* 11.1*  HCT 28.4* 32.6*  MCV 91.6 91.6  PLT 200 240   Cardiac Enzymes:  Basename 01/22/11 01/21/11 1526  CKTOTAL 163 188*  CKMB 1.8 2.2  CKMBINDEX -- --  TROPONINI <0.30 <0.30   BNP: No results found for this basename: POCBNP:3 in the last 72 hours D-Dimer: No results found for this basename: DDIMER:2 in the last 72 hours CBG:  Basename 01/24/11 0723 01/23/11 1619 01/23/11 1133 01/23/11 0727 01/22/11 2205  01/22/11 1638  GLUCAP 131* 155* 156* 133* 141* 173*   Hemoglobin A1C: No results found for this basename: HGBA1C in the last 72 hours Fasting Lipid Panel: No results found for this basename: CHOL,HDL,LDLCALC,TRIG,CHOLHDL,LDLDIRECT in the last 72 hours Thyroid Function Tests: No results found for this basename: TSH,T4TOTAL,FREET4,T3FREE,THYROIDAB in the last 72 hours Anemia Panel: No results found for this basename: VITAMINB12,FOLATE,FERRITIN,TIBC,IRON,RETICCTPCT in the last 72 hours Urine Drug Screen:  Urinalysis:  Misc. Labs:  ABGS No results found for this basename: PHART,PCO2,PO2ART,TCO2,HCO3 in the last 72 hours CULTURES Recent Results (from the past 240 hour(s))  SURGICAL PCR SCREEN     Status: Abnormal   Collection Time   01/21/11 12:00 PM      Component Value Range Status Comment   MRSA, PCR NEGATIVE  NEGATIVE  Final    Staphylococcus aureus POSITIVE (*) NEGATIVE  Final   URINE CULTURE     Status: Normal   Collection Time   01/21/11 12:35 PM      Component Value Range Status Comment   Specimen Description URINE, CATHETERIZED   Final    Special Requests NONE   Final    Setup Time [id number]   Final    Colony Count >=100,000 COLONIES/ML   Final    Culture KLEBSIELLA PNEUMONIAE   Final    Report Status 01/23/2011 FINAL   Final    Organism ID, Bacteria KLEBSIELLA PNEUMONIAE   Final    Studies/Results: No results found.  Medications:  Scheduled:   . ipratropium  0.5 mg Nebulization Q4H   And  . albuterol  2.5 mg Nebulization Q4H  . antiseptic oral rinse   Mouth Rinse q12n4p  . ceFAZolin (ANCEF) IV  1 g Intravenous Once  . Chlorhexidine Gluconate Cloth  6 each Topical Daily  . diltiazem  180 mg Oral Daily  . docusate sodium  100 mg Oral BID  . glipiZIDE  2.5 mg Oral Daily  . guaiFENesin  1,200 mg Oral BID  . insulin aspart  0-5 Units Subcutaneous QHS  . insulin aspart  0-9 Units Subcutaneous TID WC  . lisinopril  5 mg Oral Daily  . memantine  10 mg Oral BID    . metoprolol tartrate  12.5 mg Oral Once  . metoprolol tartrate  25 mg Oral BID  . mupirocin  1 application Nasal BID  . povidone-iodine   Topical Once  . propafenone  225 mg Oral BID  . sodium chloride      . sodium chloride      . DISCONTD: metoprolol tartrate  12.5 mg Oral BID   Continuous:   . 0.9 % NaCl with KCl 20 mEq / L 100 mL/hr at 01/23/11 1138   ZOX:[id number], acetaminophen, bisacodyl, HYDROmorphone, ondansetron (ZOFRAN) IV, ondansetron, sodium phosphate, traZODone  Assesment: She has COPD. She has a hip fracture. She has atrial fibrillation, diabetes, and dementia. She is still confused. She is being transferred to [facility] for surgery Principal Problem:  *Hip fracture Active Problems:  Dehydration  Confusion  Diabetes mellitus type 2 in nonobese  Organic brain syndrome    Plan: I will of course sign off at this point I be glad to see her if needed in the future    LOS: 3 days   Loyal Rudy L 01/24/2011, 8:32 AM

## 2011-01-24 NOTE — Consult Note (Signed)
NAMEZAMORIA, BOSS NO.:  [id number]  MEDICAL RECORD NO.:  [id number]  LOCATION:                                 FACILITY:  PHYSICIAN:  Nicki Guadalajara, M.D.     DATE OF BIRTH:  Apr 21, 1925  DATE OF CONSULTATION: DATE OF DISCHARGE:                                CONSULTATION   [name] is an 77 year old female who was admitted following a fall and was found to have a left hip fracture and in need for surgery. She was referred for preoperative cardiology clearance.  The history was taken predominantly by the patient's daughter since the patient had received recent pain medication and she was unable to speak and communicate.  HISTORY OF PRESENT ILLNESS:  According to the patient's daughter, the patient is 35 years old.  She has a history of paroxysmal atrial fibrillation and for many years had been on Rythmol while living in [place].  Approximately, 8 years ago the daughter recalls that the patient underwent cardiac catheterization and had a stent placed in one of her coronary vessels.  The daughter is not aware of any subsequent stress testing.  Apparently, she has moved to the [place] area approximately 5 years ago.  In September 2010, she apparently sustained a hip fracture on the right.  At that time, she was seen by Dr. Dietrich Pates who recommended the patient stop Rythmol.  Apparently, she developed postoperative atrial fibrillation.  Apparently, the patient has been back on Rythmol since.  She currently resides at [facility].  According to the daughter, the patient does have difficulty with dementia.  She has not complained of chest pain.  She has not complained of increasing shortness of breath.  She apparently fell on the evening of January 20, 2011, leading to hospitalization on January 21, 2011.  She has now been found to have left hip fracture and need for surgery and is rescheduled for tomorrow.  Cardiology consultation was  requested and we were asked to see the patient per family request.  CURRENT MEDICATIONS: 1. Aspirin 325 mg daily. 2. Cardizem CD 180 mg. 3. Glucotrol 2.5 mg. 4. Lisinopril 5 mg. 5. Namenda 10 mg daily. 6. Glucophage 500 mg with breakfast. 7. Metoprolol tartrate 12.5 mg b.i.d. 8. Propafenone 225 mg b.i.d. in addition to Senokot.  The patient apparently is allergic to SULFA antibiotics.  SOCIAL HISTORY:  She resides at [facility].  There is no tobacco history.  There is no significant alcohol history.  FAMILY HISTORY:  Notable that both parents are deceased according the daughter of old age.  She has three siblings all alive two brothers and one sister who all have dementia.  REVIEW OF SYSTEMS:  Hard to obtain from the patient.  According to the daughter, the patient does have memory issues.  Her Alzheimer wax and wane in severity.  She is not very active.  She is unaware of increased shortness of breath.  She is unaware of any breakthrough atrial fibrillation since has been back on the Rythmol.  She does have a history of urine infections.  She does have a history of renal insufficiency.  She has difficulty walking.  Other system review is negative.  PHYSICAL EXAMINATION:  GENERAL:  On exam today, again the patient was somnolent after having been medicated with pain medications. VITAL SIGNS:  Blood pressure earlier today was 199/73.  On admission was 165/62 and subsequently 156/53.  O2 saturation is 94%.  Temperatures is 100.  Respirations were 18. EYES:  She had arcus senilis. NECK:  She did have left carotid bruits.  There is no JVD. LUNGS:  Decreased breath sounds without wheezing.  Rhythm was regular with a 1/6 systolic murmur.  Pulse was in the 90s. ABDOMEN:  Soft and nontender. EXTREMITIES:  Her left leg was in a splint.  There was trace edema.  LABORATORY DATA:  A 12-lead ECG showed sinus rhythm with first-degree AV block with PR interval 212 milliseconds.  QTc  interval 400 milliseconds, ventricular rate 93 milliseconds.  Laboratory from January 21, 2011, showed a white count initially 16.7, hemoglobin 12.3, hematocrit 37.0.  Subsequent laboratory yesterday showed a hemoglobin of 11.2, hematocrit  32.6, white count 10.1, platelets 240,000.  TSH was normal.  Cardiac enzymes were negative.  Chest x-ray from January 21, 2011, showed normal heart size and pulmonary vascularity.  Emphysematous changes were noted in her lungs and there was evidence for peribronchial thickening centrally consistent with chronic bronchitis.  She did not have any focal airspace consolidation. X-ray of her left hip shows an acute fracture of the left femoral neck with supralateral displacement of the distal fracture fragment resulting in a varus angulation.  ASSESSMENT:  Ms. [name] is an 77 year old female who apparently has a remote history of paroxysmal atrial fibrillation and has been on antiarrhythmic therapy according to the daughter for over 20 years.  It sounds like she has been maintaining sinus rhythm but did have atrial fibrillation in the perioperative period 2 years ago when she was taken off her propafenone.  According to the daughter, the patient has not had any recent nuclear perfusion imaging.  She is status post remote stenting.  She has been asymptomatic with reference to chest pain. Cardiac enzymes were negative.  Her ECG does not show any old evidence for myocardial infarction or any ischemic ECG changes.  Presently, I would recommend 2-D echo Doppler evaluation to assess LV systolic diastolic function.  She does have a left carotid bruit.  Apparently carotid duplex imaging in 2010 showed bilateral plaque without high- grade focal stenoses.  If her echo Doppler study shows normal function in light of her recent symptomatology clearance will be given for hip surgery.  Ideally this should be done with spinal rather than general anesthesia to reduce  potential cardiovascular risk.  If this is to be delayed and prolonged and not done in the near future consideration for preoperative Lexiscan Myoview imaging may be hopeful to assess coronary perfusion in this patient status post remote PCI.  We do not have any records from this procedure which apparently was done in [place], [place].  Colleague will see the patient tomorrow.  The blood pressure needs to be monitored closely. The patient should be placed on postoperative telemetry.          ______________________________ Nicki Guadalajara, M.D.     TK/MEDQ  D:  01/23/2011  T:  01/23/2011  Job:  [id number]  cc:   Dr. Karilyn Cota  Dr. Romeo Apple Orthopedic Surgeon  Dr. Hilda Lias

## 2011-01-24 NOTE — Consult Note (Signed)
Awaiting decision regarding surgery.  CSW updated PNC on pt and will continue to follow.  Plan is for pt to return to [facility] when medically stable.   Karn Cassis

## 2011-01-24 NOTE — Progress Notes (Signed)
Physician Discharge Summary  Patient ID: [name] MRN: [id number] DOB/AGE: 07/10/1924 77 y.o.  Admit date: 01/21/2011 Discharge date: 01/24/2011  Primary Care Physician:  Dwana Melena, MD   Discharge Diagnoses:    Patient Active Problem List  Diagnoses  . Dehydration  . Confusion  . Diabetes mellitus type 2 in nonobese  . Hip fracture  . Organic brain syndrome  . Chronic a-fib  . COPD (chronic obstructive pulmonary disease)    Current Discharge Medication List    CONTINUE these medications which have NOT CHANGED   Details  aspirin 325 MG tablet Take 325 mg by mouth daily.      Calcium Carbonate (OS-CAL PO) Take 500 mg by mouth daily.      calcium carbonate (TUMS - DOSED IN MG ELEMENTAL CALCIUM) 500 MG chewable tablet Chew 1 tablet by mouth 2 (two) times daily.      !! diltiazem (CARDIZEM CD) 180 MG 24 hr capsule Take 180 mg by mouth daily.     !! diltiazem (CARDIZEM CD) 180 MG 24 hr capsule Take 360 mg by mouth daily.      diphenhydrAMINE (BENADRYL) 25 MG tablet Take 25 mg by mouth every 6 (six) hours as needed.     docusate sodium (COLACE) 100 MG capsule Take 100 mg by mouth 2 (two) times daily.      glipiZIDE (GLUCOTROL XL) 2.5 MG 24 hr tablet Take 2.5 mg by mouth daily.      !! guaiFENesin (MUCINEX) 600 MG 12 hr tablet Take 1,200 mg by mouth 2 (two) times daily.     !! guaiFENesin (MUCINEX) 600 MG 12 hr tablet Take 600 mg by mouth 2 (two) times daily.      HYDROcodone-acetaminophen (VICODIN) 5-500 MG per tablet Take 1 tablet by mouth every 4 (four) hours as needed.      ipratropium-albuterol (DUONEB) 0.5-2.5 (3) MG/3ML SOLN Take 3 mLs by nebulization every 6 (six) hours as needed. SOB/Wheezing     lisinopril (PRINIVIL,ZESTRIL) 5 MG tablet Take 5 mg by mouth daily.      memantine (NAMENDA) 10 MG tablet Take 10 mg by mouth 2 (two) times daily.      metFORMIN (GLUCOPHAGE) 500 MG tablet Take 500 mg by mouth daily with breakfast.      metoprolol tartrate  (LOPRESSOR) 25 MG tablet Take 12.5 mg by mouth 2 (two) times daily.      propafenone (RYTHMOL) 225 MG tablet Take 225 mg by mouth 2 (two) times daily.      ranitidine (ZANTAC) 150 MG tablet Take 150 mg by mouth 2 (two) times daily.      !! senna (SENOKOT) 8.6 MG tablet Take 1 tablet by mouth daily.     !! senna (SENOKOT) 8.6 MG tablet Take 1 tablet by mouth 2 (two) times daily.       !! - Potential duplicate medications found. Please discuss with provider.       Disposition and Follow-up: Patient will be transferred to [facility] as patient's daughter would prefer that hip surgery occur there.  Consults:  orthopedic surgery Dr. Romeo Apple.   Significant Diagnostic Studies:  Dg Chest 1 View  01/21/2011  *RADIOLOGY REPORT*  Clinical Data: Hip fracture  CHEST - 1 VIEW  Comparison: 10/24/2010  Findings: Normal heart size and pulmonary vascularity. Emphysematous changes in the lungs.  Peribronchial thickening centrally consistent with chronic bronchitis.  No focal airspace consolidation.  No blunting of costophrenic angles.  Calcified aorta.  IMPRESSION: Emphysematous and chronic  bronchitic changes.  No evidence of active pulmonary disease.  Original Report Authenticated By: Marlon Pel, M.D.   Dg Hip Complete Left  01/21/2011  *RADIOLOGY REPORT*  Clinical Data: Deformity after fall.  LEFT HIP - COMPLETE 2+ VIEW  Comparison: 07/23/2010  Findings: There is an acute fracture of the left femoral neck with superolateral displacement of the distal fracture fragment resulting in varus angulation.  No acetabular dislocation.  No focal bone lesions identified.  Fairly extensive vascular calcifications are noted.  Previous right hip arthroplasty.  The pelvis, SI joints, and symphysis pubis appear intact.  Per CMS PQRS reporting requirements (PQRS Measure 24): Given the patient's age of greater than 50 and the fracture site (hip, distal radius, or spine), the patient should be tested for  osteoporosis using DXA, and the appropriate treatment considered based on the DXA results.  IMPRESSION: Acute fracture of the left femoral neck with varus angulation.  Original Report Authenticated By: Marlon Pel, M.D.      Hospital Course:   1.  Hip fracture: She has been evaluated at [facility] by Dr. Romeo Apple with orthopedic surgery. He requested cardiology and pulmonary clearance prior to surgery. She has been seen by Dr. Juanetta Gosling and by Dr. Tresa Endo. Patient's daughter has informed us that she would prefer surgery occur in [facility] if possible by Dr. Mckinley Jewel, so transfer to [facility] will occur today. 2.  Dehydration 3.  Confusion 4.  Diabetes mellitus type 2 in nonobese 5.  Organic brain syndrome 6.  Chronic a-fib: Is not on Coumadin. Is chronically maintained on Rythmol. Per daughter's report when she had surgery to her other hip her Rythmol was discontinued and it was difficult to control her rhythm. She is adamant that Rythmol not be discontinued this time. 7.  COPD (chronic obstructive pulmonary disease): Very mild.   SignedChaya Jan 01/24/2011, 10:10 AM

## 2011-01-25 ENCOUNTER — Inpatient Hospital Stay (HOSPITAL_COMMUNITY): Payer: Medicare HMO

## 2011-01-25 LAB — TYPE AND SCREEN
ABO/RH(D): A POS
Antibody Screen: POSITIVE
Donor AG Type: NEGATIVE
PT AG Type: NEGATIVE
Unit division: 0

## 2011-01-25 LAB — BASIC METABOLIC PANEL
GFR calc Af Amer: >60 mL/min (ref >60)
GFR calc non Af Amer: >60 mL/min (ref >60)
Glucose, Bld: 141 mg/dL — ABNORMAL HIGH (ref 70–99)
Potassium: 4.4 mEq/L (ref 3.5–5.1)
Sodium: 133 mEq/L — ABNORMAL LOW (ref 135–145)

## 2011-01-25 LAB — GLUCOSE, CAPILLARY
Glucose-Capillary: 135 mg/dL — ABNORMAL HIGH (ref 70–99)
Glucose-Capillary: 143 mg/dL — ABNORMAL HIGH (ref 70–99)
Glucose-Capillary: 131 mg/dL — ABNORMAL HIGH (ref 70–99)
Glucose-Capillary: 137 mg/dL — ABNORMAL HIGH (ref 70–99)

## 2011-01-25 LAB — CBC
Hemoglobin: 9.8 g/dL — ABNORMAL LOW (ref 12.0–15.0)
MCHC: 33.8 g/dL (ref 30.0–36.0)
Platelets: 230 10*3/uL (ref 150–400)

## 2011-01-26 LAB — CBC
MCHC: 34 g/dL (ref 30.0–36.0)
Platelets: 223 10*3/uL (ref 150–400)
RDW: 12.8 % (ref 11.5–15.5)
WBC: 8.7 10*3/uL (ref 4.0–10.5)

## 2011-01-26 LAB — BASIC METABOLIC PANEL
GFR calc Af Amer: >60 mL/min (ref >60)
GFR calc non Af Amer: >60 mL/min (ref >60)
Potassium: 3.9 mEq/L (ref 3.5–5.1)
Sodium: 131 mEq/L — ABNORMAL LOW (ref 135–145)

## 2011-01-26 LAB — GLUCOSE, CAPILLARY
Glucose-Capillary: 148 mg/dL — ABNORMAL HIGH (ref 70–99)
Glucose-Capillary: 152 mg/dL — ABNORMAL HIGH (ref 70–99)

## 2011-01-27 LAB — CBC
HCT: 27.4 % — ABNORMAL LOW (ref 36.0–46.0)
MCH: 30.7 pg (ref 26.0–34.0)
MCV: 87.5 fL (ref 78.0–100.0)
RDW: 12.9 % (ref 11.5–15.5)
WBC: 10 10*3/uL (ref 4.0–10.5)

## 2011-01-27 LAB — BASIC METABOLIC PANEL
BUN: 12 mg/dL (ref 6–23)
Calcium: 8.7 mg/dL (ref 8.4–10.5)
Chloride: 100 mEq/L (ref 96–112)
Creatinine, Ser: 0.89 mg/dL (ref 0.50–1.10)
GFR calc Af Amer: >60 mL/min (ref >60)

## 2011-01-27 LAB — GLUCOSE, CAPILLARY
Glucose-Capillary: 168 mg/dL — ABNORMAL HIGH (ref 70–99)
Glucose-Capillary: 123 mg/dL — ABNORMAL HIGH (ref 70–99)
Glucose-Capillary: 120 mg/dL — ABNORMAL HIGH (ref 70–99)

## 2011-01-28 LAB — CBC
HCT: 25.3 % — ABNORMAL LOW (ref 36.0–46.0)
MCV: 87.8 fL (ref 78.0–100.0)
RBC: 2.88 MIL/uL — ABNORMAL LOW (ref 3.87–5.11)
WBC: 9.6 10*3/uL (ref 4.0–10.5)

## 2011-01-28 LAB — BASIC METABOLIC PANEL
BUN: 13 mg/dL (ref 6–23)
CO2: 23 mEq/L (ref 19–32)
Chloride: 102 mEq/L (ref 96–112)
Creatinine, Ser: 0.76 mg/dL (ref 0.50–1.10)

## 2011-01-28 LAB — GLUCOSE, CAPILLARY
Glucose-Capillary: 120 mg/dL — ABNORMAL HIGH (ref 70–99)
Glucose-Capillary: 204 mg/dL — ABNORMAL HIGH (ref 70–99)

## 2011-01-28 LAB — OSMOLALITY: Osmolality: 273 mOsm/kg — ABNORMAL LOW (ref 275–300)

## 2011-01-29 LAB — CROSSMATCH
ABO/RH(D): A POS
Antibody Screen: POSITIVE
Donor AG Type: NEGATIVE

## 2011-01-29 LAB — GLUCOSE, CAPILLARY
Glucose-Capillary: 135 mg/dL — ABNORMAL HIGH (ref 70–99)
Glucose-Capillary: 161 mg/dL — ABNORMAL HIGH (ref 70–99)

## 2011-01-29 LAB — COMPREHENSIVE METABOLIC PANEL
ALT: 7 U/L (ref 0–35)
Calcium: 8.7 mg/dL (ref 8.4–10.5)
Creatinine, Ser: 0.76 mg/dL (ref 0.50–1.10)
GFR calc Af Amer: >60 mL/min (ref >60)
Glucose, Bld: 152 mg/dL — ABNORMAL HIGH (ref 70–99)
Sodium: 136 mEq/L (ref 135–145)
Total Protein: 5.5 g/dL — ABNORMAL LOW (ref 6.0–8.3)

## 2011-01-29 LAB — CBC
HCT: 27.9 % — ABNORMAL LOW (ref 36.0–46.0)
Hemoglobin: 9.7 g/dL — ABNORMAL LOW (ref 12.0–15.0)
MCH: 30.1 pg (ref 26.0–34.0)
MCHC: 34.8 g/dL (ref 30.0–36.0)
MCV: 86.6 fL (ref 78.0–100.0)
Platelets: 283 K/uL (ref 150–400)
RBC: 3.22 MIL/uL — ABNORMAL LOW (ref 3.87–5.11)
RDW: 12.9 % (ref 11.5–15.5)
WBC: 8.2 K/uL (ref 4.0–10.5)

## 2011-01-29 LAB — DIFFERENTIAL
Basophils Absolute: 0.1 K/uL (ref 0.0–0.1)
Basophils Relative: 1 % (ref 0–1)
Eosinophils Absolute: 0.9 K/uL — ABNORMAL HIGH (ref 0.0–0.7)
Eosinophils Relative: 11 % — ABNORMAL HIGH (ref 0–5)
Lymphocytes Relative: 13 % (ref 12–46)
Lymphs Abs: 1.1 K/uL (ref 0.7–4.0)
Monocytes Absolute: 1 K/uL (ref 0.1–1.0)
Monocytes Relative: 12 % (ref 3–12)
Neutro Abs: 5.2 K/uL (ref 1.7–7.7)
Neutrophils Relative %: 64 % (ref 43–77)

## 2011-01-29 LAB — MAGNESIUM: Magnesium: 1.7 mg/dL (ref 1.5–2.5)

## 2011-01-29 NOTE — Discharge Summary (Signed)
[name], [name]                  ACCOUNT NO.:  [id number]  MEDICAL RECORD NO.:  [id number]  LOCATION:  5003                         FACILITY:  [facility]  PHYSICIAN:  Andrea Nap, MD  DATE OF BIRTH:  1925/01/24  DATE OF ADMISSION:  01/24/2011 DATE OF DISCHARGE:  01/30/2011                        DISCHARGE SUMMARY - REFERRING   PRIMARY CARE PHYSICIAN:  Dr. Pollyann [name].  CONSULTANTS INVOLVED IN THE CASE: 1. Orthopedic surgeon, Dr. Kathryne Hitch. 2. Cardiology, Dr. Tresa Endo.  [facility] INVOLVED IN THE CASE: 1. Dr. Thad Ranger. 2. Dr. Talmage [name].  DISCHARGE DIAGNOSES: 1. Left hip femoral neck fracture secondary to a fall, status post     hemiarthroplasty. 2. Urinary tract infection. 3. Anemia, status post packed RBC transfusion.  Post transfusion H and     H stable at 9.7 g/dL. 4. Hypokalemia. 5. Atrial fibrillation. 6. Diabetes mellitus. 7. Dementia.  HISTORY OF PRESENT ILLNESS:  The patient is a 77 year old Caucasianfemale who has a history of atrial fibrillation and severe dementia, was said to have fallen and subsequently sustained the left hip femoral neck fracture.  There was no history of premonitory symptoms prior to the onset before.  There was no history of fever, no chills, no rigor.  The patient was brought to the emergency room for evaluation and x-ray of the hip confirmed left femoral neck fracture and subsequently was admitted for stabilization.  PREADMISSION MEDS: 1. Senokot 1 tablet p.o. b.i.d. 2. Namenda 100 mg p.o. b.i.d. 3. Colace 100 mg p.o. b.i.d. 4. Os-Cal 500 mg p.o. b.i.d. 5. Lopressor 25 mg half a tablet p.o. 3 times a day. 6. Cardizem 90 mg 1 p.o. every 6. 7. Lisinopril 5 mg p.o. daily. 8. Aspirin 325 mg p.o. daily. 9. Glucophage 500 mg p.o. daily.  ALLERGIES:  TO SULFA AND IV DYE.  SOCIAL HISTORY:  Negative for alcohol or tobacco use.  SURGICAL HISTORY:  Hysterectomy and knee replacement.  FAMILY HISTORY:  Positive  for diabetes mellitus.  REVIEW OF SYSTEMS:  Essentially documented in the initial history and physical.  LAB DATA:  Basic metabolic panel done on IV of January 21, 2011, showed sodium of 133, potassium of 4.7, chloride of 96, bicarb of 22, glucose is 204, BUN is 23; creatinine is 1.19.  Complete blood count with differential showed WBC of 16.7, hemoglobin 12.3, hematocrit 37.0, MCV of 91.4 with a platelet count of 305.  Coagulation profile showed PT 12.8, INR 0.94.  Urinalysis was positive for nitrite and negative for leukocyte esterase and the urine microscopy showed WBC of 3-6 with many bacteria and urine culture grew Klebsiella pneumoniae sensitive to cefazolin, ceftriaxone, ciprofloxacin, gentamicin, levofloxacin, tobramycin and Bactrim.  A repeat complete blood count with no differential done on January 24, 2011, showed WBC of 8.9, hemoglobin 9.5, hematocrit of 28.4, MCV of 91.6, platelet count of 200 and basic metabolic panel done on January 24, 2011, showed sodium of 132, potassium of 4.9, chloride of 102, the bicarb of 18, glucose is 143; BUN is 12; creatinine is 1.03.  Repeat blood count done on January 25, 2011, showed WBC 8.4, hemoglobin 9.8, hematocrit 28.0, MCV of 89.8, platelet count of 230.  Complete blood  count with no differential done on January 28, 2011, showed WBC of 9.6, hemoglobin 8.5, hematocrit of 25.3, MCV 87.8, platelet count is 258.  Basic metabolic panel showed sodium of 134, potassium of 3.3, chloride of 102 with a bicarb of 23, glucose is 114; BUN is 13; creatinine is 0.76 and a repeat complete blood count with differential done on January 29, 2011, after blood transfusion showed WBC of 8.2, hemoglobin of 9.7, hematocrit 27.9, MCV 86.9 with a platelet count of 283 and comprehensive metabolic panel showed sodium of 136, potassium of 3.0, chloride of 102 with a bicarb of 25, glucose is 152; BUN is 10; creatinine is 0.76 and magnesium level is 1.7.  Imaging  studies done include x-ray of the left hip which showed acute fracture of the left femoral neck with varus deformity.  Chest x-ray showed emphysematous and chronic bronchitic changes.  No evidence of acute pulmonary disease and x-ray of the hip done on January 25, 2011, showed with good appearance of femoral component of the left total hip arthroplasty and an x-ray of the pelvis showed left hip prosthesis in appropriate physician.  No acute fracture dislocation.  HOSPITAL COURSE:  The patient was admitted to the Orthopedic floor.  She was having normal saline IV to go at rate of 60 mL an hour.  She was given Zofran for nausea and pain control was done with Tylenol as well as with Dilaudid.  The patient's blood pressure as well as ventricular rate was maintained with metoprolol 25 mg p.o. b.i.d., DVT prophylaxis was done with Lovenox 40 mg subcu every 24, also given to the patient was diltiazem 80 mg p.o. daily.  We will give the patient albuterol and Atrovent nebs q.i.d.  The patient was also given lisinopril 5 mg p.o. daily, propafenone 225 mg p.o. b.i.d.  She was placed on Accu-Cheks with regular insulin sliding scale.  She was started on Rocephin 1 g IV for the treatment of UTI.  The patient was evaluated by the Cardiology prior to surgery.  She was also evaluated by the orthopedic surgeon and subsequently had left hemiarthroplasty done.  The patient was said to have tolerated surgery and postoperatively, she was followed by the physical therapist.  The patient was, however, seen by me for the very first time in this admission on January 28, 2011.  During this encounter, she was unable to verbalize any specific complaint which I was made to understand that was baseline.  Her vital signs, blood pressure was 162/56, pulse 73, respiratory rate 18, temperature was 98.7.  Her review of hematological indices as well as chemistry showed the patient to have hemoglobin of 8.5 which showed  a drop of more than 2 g from admission and post surgery.  She was typed and crossed and subsequently transfused with 1 unit of packed RBCs; and the post transfusion, H and H is 9.7 g.  The patient was seen by me for the second time on this admission on January 29, 2011, said to be uneventful.  Examination of the patient was essentially unremarkable. Her vital signs, blood pressure is 154/67, pulse 76, respiratory rate 16.  A review of her hematological indices as well as chemistry showed potassium to be 3.0, so far the patient is medically stable.  Plan is for the patient to be given 40 mg of KCl p.o. prior to being discharged to Rehab.  So far, the patient has remained clinically stable.  The plan today is for the patient to be  discharged to Rehab on activity as tolerated, diabetic [name] diet.  MEDICATIONS:  Medications to be taken at the rehab facility: 1. Lovenox 40 mg subcu daily at bedtime for DVT prophylaxis. 2. Ensure 237 mL by mouth t.i.d. with meals. 3. Atrovent nebs every 6 p.r.n. 4. Metoprolol 50 mg p.o. b.i.d. 5. Cipro 500 mg p.o. b.i.d. for the next 5 days to complete treatment     for UTI. 6. Lisinopril 10 mg p.o. daily. 7. Aspirin 25 mg p.o. daily. 8. Benadryl (diphenhydramine) 1 tablet p.o. every 6 p.r.n. 9. Calcium carbonate 500 mg 1 p.o. b.i.d. 10.Cardizem CD (diltiazem) 180 mg 2 capsules p.o. daily. 11.Colace 100 mg 1 p.o. b.i.d. 12.DuoNeb (ipratropium)/albuterol nebs every 6 p.r.n. for wheezing. 13.Glipizide XL 2.5 mg 1 p.o. daily. 14.Hydrocodone/APAP 5/500 one p.o. every 4 p.r.n. for pain control. 15.Mucinex (guaifenesin SR) 600 mg 1 p.o. b.i.d. 16.Namenda (memantine) 100 mg 1 p.o. b.i.d. 17.Ranitidine 150 mg p.o. b.i.d. 18.Rythmol (propafenone) 225 mg 1 p.o. every 24 hours. 19.Senokot (senna) over-the-counter 1 p.o. b.i.d. p.r.n. for     constipation. 20.KCl 10 mEq 1 p.o. b.i.d.     Andrea Nap, MD     CN/MEDQ  D:  01/29/2011  T:  01/29/2011   Job:  [id number]  Electronically Signed by [name]  on 01/29/2011 02:44:41 PM

## 2011-01-30 ENCOUNTER — Inpatient Hospital Stay
Admission: RE | Admit: 2011-01-30 | Discharge: 2012-03-27 | Disposition: A | Discharge status: Nursing Home (Includes ICF) | Payer: Medicare HMO | Source: Ambulatory Visit | Attending: Internal Medicine | Admitting: Internal Medicine

## 2011-01-30 DIAGNOSIS — I509 Heart failure, unspecified: Secondary | ICD-10-CM

## 2011-01-30 DIAGNOSIS — Z96649 Presence of unspecified artificial hip joint: Principal | ICD-10-CM

## 2011-01-30 LAB — GLUCOSE, CAPILLARY: Glucose-Capillary: 175 mg/dL — ABNORMAL HIGH (ref 70–99)

## 2011-01-31 LAB — GLUCOSE, CAPILLARY
Glucose-Capillary: 130 mg/dL — ABNORMAL HIGH (ref 70–99)
Glucose-Capillary: 175 mg/dL — ABNORMAL HIGH (ref 70–99)

## 2011-01-31 NOTE — Consult Note (Signed)
  [name], [name]                  ACCOUNT NO.:  [id number]  MEDICAL RECORD NO.:  [id number]  LOCATION:  3702                         FACILITY:  [facility]  PHYSICIAN:  Vanita Panda. Magnus Ivan, M.D.DATE OF BIRTH:  08-24-1924  DATE OF CONSULTATION:  01/24/2011 DATE OF DISCHARGE:                                CONSULTATION   REASON FOR CONSULTATION:  Left hip fracture/femoral neck fracture.  CONSULTING MD:  Dr. Isidoro Donning with [facility].  HISTORY OF PRESENT ILLNESS:  Ms. [name] is an very pleasant 77 year old female with dementia.  She stays in a nursing facility in [place]. She has had a previous right hip fracture and underwent a hemiarthroplasty in 2010 for this.  On January 21, 2011, she sustained a mechanical fall and was found to have a left hip displaced femoral neck fracture.  She was seen at [facility] and her daughter wanted to have her transferred here for definitive treatment of the hip fracture to [facility].  She says her mother is certainly comfortable and has been alert, but not oriented secondary to displacement.  She has otherwise had stable medical course, and has been seen by cardiac physicians down here as well for clearance for surgery.  PAST MEDICAL HISTORY: 1. Previous right hip fracture. 2. History of paroxysmal atrial fibrillation. 3. History of diabetes type 2. 4. History of hypertension.  SOCIAL HISTORY:  She lives in a skilled nursing facility in [place].  MEDICATIONS: 1. Aspirin. 2. Cardizem. 3. Glucotrol. 4. Lisinopril. 5. [name]. 6. Glucophage. 7. Metoprolol. 8. Propafenone.  ALLERGIES:  SULFA.  FAMILY HISTORY:  Noncontributory.  REVIEW OF SYSTEMS:  She appears and not have any shortness of breath, fever, chills, nausea, vomiting, or chest pain.  PHYSICAL EXAMINATION:  VITAL SIGNS:  Heart rate 105, respiratory rate 18, blood pressure 166/72, she is afebrile. GENERAL:  She is alert, but not oriented and appears  comfortable. HEENT:  Normocephalic, atraumatic.  Pupils equal, round and reactive to light. EXTREMITIES:  Examination of the left leg shows that she has shortening and external rotation pain with range of motion and skin intact.  X-rays reviewed and show a displaced left hip femoral neck fracture.  ASSESSMENT:  This is an 77 year old demented female with acute left hip femoral neck fracture.  PLAN:  I talked with family in length who do understand the risks and benefits of surgery, without surgery she will definitely have a hard time with even ambulating with pain and they understand the risks of any cardiac issues.  She has been cleared for surgery by the cardiologist, and we will proceed with surgery tomorrow.  She is on antibiotics secondary to a urinary tract infection as well, and we will set her up accordingly tomorrow for left hip hemiarthroplasty.     Vanita Panda. Magnus Ivan, M.D.     CYB/MEDQ  D:  01/24/2011  T:  01/25/2011  Job:  [id number]  Electronically Signed by Doneen Poisson M.D. on 01/31/2011 06:55:24 PM

## 2011-01-31 NOTE — Op Note (Signed)
[name], [name]                  ACCOUNT NO.:  [id number]  MEDICAL RECORD NO.:  [id number]  LOCATION:  3702                         FACILITY:  [facility]  PHYSICIAN:  Vanita Panda. Magnus Ivan, M.D.DATE OF BIRTH:  17-Jun-1924  DATE OF PROCEDURE:  01/25/2011 DATE OF DISCHARGE:                              OPERATIVE REPORT   PREOPERATIVE DIAGNOSIS:  Left hip femoral neck fracture.  POSTOPERATIVE DIAGNOSIS:  Left hip femoral neck fracture.  PROCEDURE:  Left hip hemi-arthroplasty.  IMPLANTS:  DePuy Summit Basic, Pres-Fit femoral component - 3, 51-unit polar head with -3 taper spacer.  SURGEON:  Vanita Panda. Magnus Ivan, MD  ANESTHESIA:  Spinal.  ANTIBIOTICS:  1 g of IV Ancef.  ESTIMATED BLOOD LOSS:  100 mL.  COMPLICATIONS:  None.  INDICATION:  [name] is an 77 year old demented female who is a nursing home resident.  She has had a previous right hip fracture and underwent a hemiarthroplasty of that hip several years ago.  She sustained a mechanical fall sometime around January 21, 2011.  She was found to have a hip fracture and the family requested treatment in [place].  She was sent down to [facility] yesterday and I was consulted being on-call for orthopedic unassigned call.  I recommended she undergo hemiarthroplasty given the displaced nature of fracture.  Family understands the risks and benefits of this.  She was seen by the Medicine Service as well as Cardiology for clearance and it was recommended she undergo surgery.  PROCEDURE DESCRIPTION:  After informed consent was obtained, the appropriate left hip was marked.  She was brought to the operating room, placed supine on the operating table.  She was then turned into decubitus position so spinal anesthesia could be obtained.  Once the anesthesia was obtained, her left operative hip was turned up.  Her left hip was then prepped and draped with DuraPrep and sterile stockinette. Hip positioners were also placed in the  front and the back.  Time-out was called and she was identified as correct patient and correct left hip.  I then made an incision directly over the greater trochanter and carried this proximally and distally.  I dissected down to the iliotibial band and then this was divided longitudinally.  Tromley retractor was placed and I proceeded with anterolateral portion of the hip.  I took down as a sleeve two-thirds of the gluteus medius and minimus tendons off the greater trochanter and reflected this anteriorly underneath the retractor.  I then divided the hip capsule, encountered a large hematoma and the obvious fracture.  I made my femoral neck cut proximal to the lesser trochanter and then placed corkscrew guide in the femoral head and removed this in its entirety.  I cleaned the acetabulum of any bony debris and trialed a 50 and 51 head, 51 head was the appropriate head.  Next, hip was flexed and externally rotated off the table until leg bag gained access to the femoral canal using canal finder and reamer.  We then began broaching from a size 1 broach up to a size 3, the 3 was felt to be stable, so we trialed a 51 unipolar head with a -3 space and reduced  this in the acetabulum and we felt like her leg lengths were equal and was stable with internal and external rotation.  I then removed all trials with rotation, placed the real Summit Basic +3 Pres-Fit femoral component for DePuy followed by the real unipolar 51 head with -3 spacer.  We reduced this back in the acetabulum as well.  We then irrigated the tissues and closed the joint capsule with interrupted #1 Ethibond suture.  I reapproximated the gluteus medius and minimus tendons back to the greater trochanter and oversewed these with #1 Vicryl suture and #1 Ethibond suture.  I reapproximated the iliotibial band with interrupted #1 Vicryl followed by 2-0 in the Vicryl in the subcutaneous tissues and staples on the skin.  Well-padded  sterile dressings were applied and she was rolled back into supine position and her leg lengths were near equal.  She was taken to the recovery room in stable condition.  All final counts were correct and there were no complications noted.     Vanita Panda. Magnus Ivan, M.D.     CYB/MEDQ  D:  01/25/2011  T:  01/25/2011  Job:  [id number]  Electronically Signed by Doneen Poisson M.D. on 01/31/2011 06:55:23 PM

## 2011-02-01 LAB — GLUCOSE, CAPILLARY
Glucose-Capillary: 160 mg/dL — ABNORMAL HIGH (ref 70–99)
Glucose-Capillary: 261 mg/dL — ABNORMAL HIGH (ref 70–99)
Glucose-Capillary: 192 mg/dL — ABNORMAL HIGH (ref 70–99)

## 2011-02-02 LAB — GLUCOSE, CAPILLARY
Glucose-Capillary: 204 mg/dL — ABNORMAL HIGH (ref 70–99)
Glucose-Capillary: 138 mg/dL — ABNORMAL HIGH (ref 70–99)
Glucose-Capillary: 142 mg/dL — ABNORMAL HIGH (ref 70–99)

## 2011-02-05 LAB — GLUCOSE, CAPILLARY
Glucose-Capillary: 157 mg/dL — ABNORMAL HIGH (ref 70–99)
Glucose-Capillary: 153 mg/dL — ABNORMAL HIGH (ref 70–99)

## 2011-02-06 LAB — GLUCOSE, CAPILLARY: Glucose-Capillary: 202 mg/dL — ABNORMAL HIGH (ref 70–99)

## 2011-02-08 LAB — GLUCOSE, CAPILLARY
Glucose-Capillary: 169 mg/dL — ABNORMAL HIGH (ref 70–99)
Glucose-Capillary: 161 mg/dL — ABNORMAL HIGH (ref 70–99)

## 2011-02-09 LAB — GLUCOSE, CAPILLARY: Glucose-Capillary: 143 mg/dL — ABNORMAL HIGH (ref 70–99)

## 2011-02-10 LAB — GLUCOSE, CAPILLARY
Glucose-Capillary: 193 mg/dL — ABNORMAL HIGH (ref 70–99)
Glucose-Capillary: 199 mg/dL — ABNORMAL HIGH (ref 70–99)

## 2011-02-11 ENCOUNTER — Telehealth: Payer: Self-pay | Admitting: Orthopedic Surgery

## 2011-02-11 NOTE — Telephone Encounter (Signed)
[name] from [facility], Dr. Delano Metz office, ph [phone], called to ask, per call from patient's family, [name] (ph [phone] or [phone]) as to whether patient can transfer care here to Dr. Romeo Apple, due to being in [name] following her surgery at [facility] by Dr. Magnus Ivan "left hip hemi arthroplasty" for dx: femoral neck fracture.   Otherwise, patient is due at their office for post op # 1 appointment tomorrow 02/12/11 for suture/staple removal.  Please advise.

## 2011-02-11 NOTE — Telephone Encounter (Signed)
I spoke with Dr. Romeo Apple in regard to this matter.  Dr. Romeo Apple said he is happy to follow up with this patient if Dr. Magnus Ivan releases her to transfer care here, as he had spoken with patient's daughter about.  Advises keeping post operative appointment at their office as scheduled (02/12/11 at 12:30pm).  I called back to Dr. Eliberto Ivory office to relay to [place], and their office had already closed.  Sent this note by fax, and called back to patient's daughter Jasmine December to relay that she is to keep the appointment there for now.

## 2011-02-12 LAB — GLUCOSE, CAPILLARY
Glucose-Capillary: 174 mg/dL — ABNORMAL HIGH (ref 70–99)
Glucose-Capillary: 158 mg/dL — ABNORMAL HIGH (ref 70–99)

## 2011-02-13 LAB — GLUCOSE, CAPILLARY: Glucose-Capillary: 229 mg/dL — ABNORMAL HIGH (ref 70–99)

## 2011-02-14 LAB — GLUCOSE, CAPILLARY
Glucose-Capillary: 147 mg/dL — ABNORMAL HIGH (ref 70–99)
Glucose-Capillary: 247 mg/dL — ABNORMAL HIGH (ref 70–99)

## 2011-02-15 LAB — GLUCOSE, CAPILLARY: Glucose-Capillary: 217 mg/dL — ABNORMAL HIGH (ref 70–99)

## 2011-02-16 LAB — GLUCOSE, CAPILLARY: Glucose-Capillary: 152 mg/dL — ABNORMAL HIGH (ref 70–99)

## 2011-02-17 LAB — GLUCOSE, CAPILLARY: Glucose-Capillary: 182 mg/dL — ABNORMAL HIGH (ref 70–99)

## 2011-02-18 LAB — GLUCOSE, CAPILLARY: Glucose-Capillary: 91 mg/dL (ref 70–99)

## 2011-02-19 LAB — GLUCOSE, CAPILLARY: Glucose-Capillary: 164 mg/dL — ABNORMAL HIGH (ref 70–99)

## 2011-02-19 NOTE — Telephone Encounter (Signed)
Note of release from Dr. Eliberto Ivory office had been received, approved by Dr. Romeo Apple. Appointment scheduled

## 2011-02-20 LAB — GLUCOSE, CAPILLARY: Glucose-Capillary: 245 mg/dL — ABNORMAL HIGH (ref 70–99)

## 2011-02-22 LAB — GLUCOSE, CAPILLARY: Glucose-Capillary: 150 mg/dL — ABNORMAL HIGH (ref 70–99)

## 2011-02-23 LAB — GLUCOSE, CAPILLARY
Glucose-Capillary: 159 mg/dL — ABNORMAL HIGH (ref 70–99)
Glucose-Capillary: 137 mg/dL — ABNORMAL HIGH (ref 70–99)

## 2011-02-24 LAB — GLUCOSE, CAPILLARY
Glucose-Capillary: 130 mg/dL — ABNORMAL HIGH (ref 70–99)
Glucose-Capillary: 192 mg/dL — ABNORMAL HIGH (ref 70–99)

## 2011-02-27 LAB — GLUCOSE, CAPILLARY
Glucose-Capillary: 169 mg/dL — ABNORMAL HIGH (ref 70–99)
Glucose-Capillary: 246 mg/dL — ABNORMAL HIGH (ref 70–99)

## 2011-02-28 ENCOUNTER — Telehealth: Payer: Self-pay | Admitting: Orthopedic Surgery

## 2011-02-28 LAB — GLUCOSE, CAPILLARY
Glucose-Capillary: 190 mg/dL — ABNORMAL HIGH (ref 70–99)
Glucose-Capillary: 125 mg/dL — ABNORMAL HIGH (ref 70–99)

## 2011-02-28 NOTE — Telephone Encounter (Addendum)
Per Gweneth Dimitri [facility], patient (resident) [name] DOB 01/18/1925, scheduled for appointment here 03/18/11 for left hip, hospital follow up, post op hip surgery in August.  Need Xray at [facility] prior to appointment. Please fax copy of Xray order to [facility] at Fax [phone]; their ph# is [phone].  **Dr. Romeo Apple is doing the post op care, per patient and family request. Dr. Magnus Ivan has released care to Dr. Romeo Apple (see chart note and scanned document)**

## 2011-03-01 LAB — GLUCOSE, CAPILLARY: Glucose-Capillary: 157 mg/dL — ABNORMAL HIGH (ref 70–99)

## 2011-03-03 LAB — GLUCOSE, CAPILLARY
Glucose-Capillary: 180 mg/dL — ABNORMAL HIGH (ref 70–99)
Glucose-Capillary: 124 mg/dL — ABNORMAL HIGH (ref 70–99)

## 2011-03-04 LAB — GLUCOSE, CAPILLARY: Glucose-Capillary: 179 mg/dL — ABNORMAL HIGH (ref 70–99)

## 2011-03-05 LAB — GLUCOSE, CAPILLARY
Glucose-Capillary: 151 mg/dL — ABNORMAL HIGH (ref 70–99)
Glucose-Capillary: 233 mg/dL — ABNORMAL HIGH (ref 70–99)

## 2011-03-06 LAB — GLUCOSE, CAPILLARY

## 2011-03-07 LAB — GLUCOSE, CAPILLARY: Glucose-Capillary: 188 mg/dL — ABNORMAL HIGH (ref 70–99)

## 2011-03-08 LAB — GLUCOSE, CAPILLARY
Glucose-Capillary: 179 mg/dL — ABNORMAL HIGH (ref 70–99)
Glucose-Capillary: 176 mg/dL — ABNORMAL HIGH (ref 70–99)

## 2011-03-09 LAB — GLUCOSE, CAPILLARY
Glucose-Capillary: 135 mg/dL — ABNORMAL HIGH (ref 70–99)
Glucose-Capillary: 158 mg/dL — ABNORMAL HIGH (ref 70–99)

## 2011-03-10 LAB — GLUCOSE, CAPILLARY: Glucose-Capillary: 137 mg/dL — ABNORMAL HIGH (ref 70–99)

## 2011-03-11 LAB — CBC
HCT: 35.6 — ABNORMAL LOW
MCHC: 33.9
MCV: 90.3
Platelets: 198
WBC: 9

## 2011-03-11 LAB — URINALYSIS, ROUTINE W REFLEX MICROSCOPIC
Bilirubin Urine: NEGATIVE
Glucose, UA: NEGATIVE
Hgb urine dipstick: NEGATIVE
Protein, ur: NEGATIVE
Specific Gravity, Urine: 1.014

## 2011-03-11 LAB — DIFFERENTIAL
Basophils Relative: 1
Eosinophils Absolute: 0.3
Eosinophils Relative: 4
Lymphs Abs: 1.4
Neutrophils Relative %: 72

## 2011-03-11 LAB — GLUCOSE, CAPILLARY

## 2011-03-11 LAB — POCT CARDIAC MARKERS: Myoglobin, poc: 78.8

## 2011-03-11 LAB — URINE MICROSCOPIC-ADD ON

## 2011-03-11 LAB — BASIC METABOLIC PANEL
BUN: 19
CO2: 22
Chloride: 105
Creatinine, Ser: 0.97
Glucose, Bld: 126 — ABNORMAL HIGH
Potassium: 3.8

## 2011-03-12 LAB — GLUCOSE, CAPILLARY
Glucose-Capillary: 162 mg/dL — ABNORMAL HIGH (ref 70–99)
Glucose-Capillary: 132 mg/dL — ABNORMAL HIGH (ref 70–99)

## 2011-03-13 LAB — GLUCOSE, CAPILLARY

## 2011-03-13 NOTE — Telephone Encounter (Signed)
Done 03/12/11.

## 2011-03-15 LAB — GLUCOSE, CAPILLARY
Glucose-Capillary: 153 mg/dL — ABNORMAL HIGH (ref 70–99)
Glucose-Capillary: 243 mg/dL — ABNORMAL HIGH (ref 70–99)

## 2011-03-17 ENCOUNTER — Ambulatory Visit (HOSPITAL_COMMUNITY): Payer: Medicare HMO | Attending: Orthopedic Surgery

## 2011-03-17 DIAGNOSIS — Z96649 Presence of unspecified artificial hip joint: Secondary | ICD-10-CM | POA: Insufficient documentation

## 2011-03-17 DIAGNOSIS — Z4789 Encounter for other orthopedic aftercare: Secondary | ICD-10-CM | POA: Insufficient documentation

## 2011-03-17 LAB — GLUCOSE, CAPILLARY
Glucose-Capillary: 158 mg/dL — ABNORMAL HIGH (ref 70–99)
Glucose-Capillary: 201 mg/dL — ABNORMAL HIGH (ref 70–99)

## 2011-03-18 ENCOUNTER — Ambulatory Visit (INDEPENDENT_AMBULATORY_CARE_PROVIDER_SITE_OTHER): Payer: Medicare HMO | Admitting: Orthopedic Surgery

## 2011-03-18 ENCOUNTER — Encounter: Payer: Self-pay | Admitting: Orthopedic Surgery

## 2011-03-18 VITALS — Ht 67.0 in | Wt 205.0 lb

## 2011-03-18 DIAGNOSIS — S72009A Fracture of unspecified part of neck of unspecified femur, initial encounter for closed fracture: Secondary | ICD-10-CM

## 2011-03-18 LAB — GLUCOSE, CAPILLARY: Glucose-Capillary: 133 mg/dL — ABNORMAL HIGH (ref 70–99)

## 2011-03-18 NOTE — Patient Instructions (Signed)
6 weeks f/u

## 2011-03-18 NOTE — Progress Notes (Signed)
Postoperative visit  Dr. Romeo Apple  has taken over care at family's request for Dr. Doneen Poisson  The patient is at the pain center in [facility].  She is currently in therapy with a sliding board for transfers with max assist.  Her limb alignment are normal her hip flexion is normal  She is progressing well  An x-ray report is been sent to me the x-ray system is down I cannot see the film but it indicates that the prosthesis is in good position  We have ordered an additional 2 weeks of therapy before the therapy is converted to rest her care.  The therapists have indicated that she has plateaued and they would like [place] to switch her over to restorative care but he would be to her benefit to continue with the transferring and the trunk training and strengthening    CC DR [facility]

## 2011-03-19 LAB — GLUCOSE, CAPILLARY: Glucose-Capillary: 140 mg/dL — ABNORMAL HIGH (ref 70–99)

## 2011-03-20 LAB — GLUCOSE, CAPILLARY: Glucose-Capillary: 162 mg/dL — ABNORMAL HIGH (ref 70–99)

## 2011-03-21 LAB — GLUCOSE, CAPILLARY: Glucose-Capillary: 165 mg/dL — ABNORMAL HIGH (ref 70–99)

## 2011-03-23 LAB — GLUCOSE, CAPILLARY
Glucose-Capillary: 133 mg/dL — ABNORMAL HIGH (ref 70–99)
Glucose-Capillary: 121 mg/dL — ABNORMAL HIGH (ref 70–99)

## 2011-03-26 LAB — GLUCOSE, CAPILLARY: Glucose-Capillary: 154 mg/dL — ABNORMAL HIGH (ref 70–99)

## 2011-03-27 LAB — GLUCOSE, CAPILLARY: Glucose-Capillary: 147 mg/dL — ABNORMAL HIGH (ref 70–99)

## 2011-03-29 LAB — GLUCOSE, CAPILLARY: Glucose-Capillary: 138 mg/dL — ABNORMAL HIGH (ref 70–99)

## 2011-03-30 LAB — GLUCOSE, CAPILLARY: Glucose-Capillary: 205 mg/dL — ABNORMAL HIGH (ref 70–99)

## 2011-03-31 LAB — GLUCOSE, CAPILLARY
Glucose-Capillary: 143 mg/dL — ABNORMAL HIGH (ref 70–99)
Glucose-Capillary: 167 mg/dL — ABNORMAL HIGH (ref 70–99)

## 2011-04-05 LAB — GLUCOSE, CAPILLARY: Glucose-Capillary: 149 mg/dL — ABNORMAL HIGH (ref 70–99)

## 2011-04-07 LAB — GLUCOSE, CAPILLARY: Glucose-Capillary: 147 mg/dL — ABNORMAL HIGH (ref 70–99)

## 2011-04-08 LAB — GLUCOSE, CAPILLARY: Glucose-Capillary: 136 mg/dL — ABNORMAL HIGH (ref 70–99)

## 2011-04-12 LAB — GLUCOSE, CAPILLARY: Glucose-Capillary: 138 mg/dL — ABNORMAL HIGH (ref 70–99)

## 2011-04-15 LAB — GLUCOSE, CAPILLARY: Glucose-Capillary: 139 mg/dL — ABNORMAL HIGH (ref 70–99)

## 2011-04-18 LAB — GLUCOSE, CAPILLARY: Glucose-Capillary: 164 mg/dL — ABNORMAL HIGH (ref 70–99)

## 2011-04-19 LAB — GLUCOSE, CAPILLARY: Glucose-Capillary: 189 mg/dL — ABNORMAL HIGH (ref 70–99)

## 2011-04-21 LAB — GLUCOSE, CAPILLARY: Glucose-Capillary: 136 mg/dL — ABNORMAL HIGH (ref 70–99)

## 2011-04-22 LAB — GLUCOSE, CAPILLARY: Glucose-Capillary: 143 mg/dL — ABNORMAL HIGH (ref 70–99)

## 2011-04-26 LAB — GLUCOSE, CAPILLARY: Glucose-Capillary: 172 mg/dL — ABNORMAL HIGH (ref 70–99)

## 2011-04-29 LAB — GLUCOSE, CAPILLARY: Glucose-Capillary: 178 mg/dL — ABNORMAL HIGH (ref 70–99)

## 2011-04-30 LAB — GLUCOSE, CAPILLARY: Glucose-Capillary: 164 mg/dL — ABNORMAL HIGH (ref 70–99)

## 2011-05-02 LAB — GLUCOSE, CAPILLARY: Glucose-Capillary: 161 mg/dL — ABNORMAL HIGH (ref 70–99)

## 2011-05-06 ENCOUNTER — Ambulatory Visit: Payer: Medicare HMO | Admitting: Orthopedic Surgery

## 2011-05-06 LAB — GLUCOSE, CAPILLARY: Glucose-Capillary: 146 mg/dL — ABNORMAL HIGH (ref 70–99)

## 2011-05-09 LAB — GLUCOSE, CAPILLARY: Glucose-Capillary: 171 mg/dL — ABNORMAL HIGH (ref 70–99)

## 2011-05-11 LAB — GLUCOSE, CAPILLARY: Glucose-Capillary: 149 mg/dL — ABNORMAL HIGH (ref 70–99)

## 2011-05-15 LAB — GLUCOSE, CAPILLARY: Glucose-Capillary: 161 mg/dL — ABNORMAL HIGH (ref 70–99)

## 2011-05-16 LAB — GLUCOSE, CAPILLARY: Glucose-Capillary: 148 mg/dL — ABNORMAL HIGH (ref 70–99)

## 2011-05-19 LAB — GLUCOSE, CAPILLARY: Glucose-Capillary: 148 mg/dL — ABNORMAL HIGH (ref 70–99)

## 2011-05-20 LAB — GLUCOSE, CAPILLARY: Glucose-Capillary: 153 mg/dL — ABNORMAL HIGH (ref 70–99)

## 2011-05-22 LAB — GLUCOSE, CAPILLARY: Glucose-Capillary: 145 mg/dL — ABNORMAL HIGH (ref 70–99)

## 2011-06-04 LAB — GLUCOSE, CAPILLARY
Glucose-Capillary: 162 mg/dL — ABNORMAL HIGH (ref 70–99)
Glucose-Capillary: 172 mg/dL — ABNORMAL HIGH (ref 70–99)
Glucose-Capillary: 152 mg/dL — ABNORMAL HIGH (ref 70–99)
Glucose-Capillary: 200 mg/dL — ABNORMAL HIGH (ref 70–99)
Glucose-Capillary: 146 mg/dL — ABNORMAL HIGH (ref 70–99)

## 2011-06-08 LAB — GLUCOSE, CAPILLARY: Glucose-Capillary: 156 mg/dL — ABNORMAL HIGH (ref 70–99)

## 2011-06-09 LAB — GLUCOSE, CAPILLARY: Glucose-Capillary: 154 mg/dL — ABNORMAL HIGH (ref 70–99)

## 2011-06-12 LAB — GLUCOSE, CAPILLARY: Glucose-Capillary: 156 mg/dL — ABNORMAL HIGH (ref 70–99)

## 2011-06-17 LAB — GLUCOSE, CAPILLARY: Glucose-Capillary: 174 mg/dL — ABNORMAL HIGH (ref 70–99)

## 2011-06-20 LAB — GLUCOSE, CAPILLARY
Glucose-Capillary: 118 mg/dL — ABNORMAL HIGH (ref 70–99)
Glucose-Capillary: 92 mg/dL (ref 70–99)

## 2011-06-23 LAB — GLUCOSE, CAPILLARY: Glucose-Capillary: 124 mg/dL — ABNORMAL HIGH (ref 70–99)

## 2011-06-26 LAB — GLUCOSE, CAPILLARY: Glucose-Capillary: 158 mg/dL — ABNORMAL HIGH (ref 70–99)

## 2011-06-28 LAB — GLUCOSE, CAPILLARY: Glucose-Capillary: 124 mg/dL — ABNORMAL HIGH (ref 70–99)

## 2011-06-29 LAB — GLUCOSE, CAPILLARY: Glucose-Capillary: 130 mg/dL — ABNORMAL HIGH (ref 70–99)

## 2011-07-01 LAB — GLUCOSE, CAPILLARY: Glucose-Capillary: 144 mg/dL — ABNORMAL HIGH (ref 70–99)

## 2011-07-04 LAB — GLUCOSE, CAPILLARY: Glucose-Capillary: 146 mg/dL — ABNORMAL HIGH (ref 70–99)

## 2011-07-05 LAB — GLUCOSE, CAPILLARY: Glucose-Capillary: 159 mg/dL — ABNORMAL HIGH (ref 70–99)

## 2011-07-07 LAB — GLUCOSE, CAPILLARY: Glucose-Capillary: 115 mg/dL — ABNORMAL HIGH (ref 70–99)

## 2011-07-10 LAB — GLUCOSE, CAPILLARY: Glucose-Capillary: 137 mg/dL — ABNORMAL HIGH (ref 70–99)

## 2011-07-11 LAB — GLUCOSE, CAPILLARY: Glucose-Capillary: 156 mg/dL — ABNORMAL HIGH (ref 70–99)

## 2011-07-15 LAB — GLUCOSE, CAPILLARY: Glucose-Capillary: 141 mg/dL — ABNORMAL HIGH (ref 70–99)

## 2011-07-18 LAB — GLUCOSE, CAPILLARY: Glucose-Capillary: 154 mg/dL — ABNORMAL HIGH (ref 70–99)

## 2011-07-19 LAB — GLUCOSE, CAPILLARY: Glucose-Capillary: 149 mg/dL — ABNORMAL HIGH (ref 70–99)

## 2011-07-21 LAB — GLUCOSE, CAPILLARY: Glucose-Capillary: 135 mg/dL — ABNORMAL HIGH (ref 70–99)

## 2011-07-23 LAB — GLUCOSE, CAPILLARY: Glucose-Capillary: 155 mg/dL — ABNORMAL HIGH (ref 70–99)

## 2011-07-24 LAB — GLUCOSE, CAPILLARY: Glucose-Capillary: 128 mg/dL — ABNORMAL HIGH (ref 70–99)

## 2011-07-28 LAB — GLUCOSE, CAPILLARY: Glucose-Capillary: 169 mg/dL — ABNORMAL HIGH (ref 70–99)

## 2011-07-29 LAB — GLUCOSE, CAPILLARY

## 2011-07-30 LAB — GLUCOSE, CAPILLARY: Glucose-Capillary: 145 mg/dL — ABNORMAL HIGH (ref 70–99)

## 2011-07-31 LAB — GLUCOSE, CAPILLARY: Glucose-Capillary: 145 mg/dL — ABNORMAL HIGH (ref 70–99)

## 2011-08-03 LAB — GLUCOSE, CAPILLARY: Glucose-Capillary: 168 mg/dL — ABNORMAL HIGH (ref 70–99)

## 2011-08-05 LAB — GLUCOSE, CAPILLARY: Glucose-Capillary: 128 mg/dL — ABNORMAL HIGH (ref 70–99)

## 2011-08-06 LAB — GLUCOSE, CAPILLARY: Glucose-Capillary: 153 mg/dL — ABNORMAL HIGH (ref 70–99)

## 2011-08-10 LAB — GLUCOSE, CAPILLARY: Glucose-Capillary: 137 mg/dL — ABNORMAL HIGH (ref 70–99)

## 2011-08-13 LAB — GLUCOSE, CAPILLARY: Glucose-Capillary: 144 mg/dL — ABNORMAL HIGH (ref 70–99)

## 2011-08-14 LAB — GLUCOSE, CAPILLARY: Glucose-Capillary: 166 mg/dL — ABNORMAL HIGH (ref 70–99)

## 2011-08-17 LAB — GLUCOSE, CAPILLARY: Glucose-Capillary: 152 mg/dL — ABNORMAL HIGH (ref 70–99)

## 2011-08-20 LAB — GLUCOSE, CAPILLARY: Glucose-Capillary: 132 mg/dL — ABNORMAL HIGH (ref 70–99)

## 2011-08-23 LAB — GLUCOSE, CAPILLARY: Glucose-Capillary: 133 mg/dL — ABNORMAL HIGH (ref 70–99)

## 2011-08-24 LAB — GLUCOSE, CAPILLARY: Glucose-Capillary: 117 mg/dL — ABNORMAL HIGH (ref 70–99)

## 2011-08-27 LAB — GLUCOSE, CAPILLARY: Glucose-Capillary: 138 mg/dL — ABNORMAL HIGH (ref 70–99)

## 2011-08-29 LAB — GLUCOSE, CAPILLARY: Glucose-Capillary: 138 mg/dL — ABNORMAL HIGH (ref 70–99)

## 2011-08-30 LAB — GLUCOSE, CAPILLARY: Glucose-Capillary: 111 mg/dL — ABNORMAL HIGH (ref 70–99)

## 2011-08-31 LAB — GLUCOSE, CAPILLARY: Glucose-Capillary: 157 mg/dL — ABNORMAL HIGH (ref 70–99)

## 2011-09-01 LAB — GLUCOSE, CAPILLARY: Glucose-Capillary: 134 mg/dL — ABNORMAL HIGH (ref 70–99)

## 2011-09-02 LAB — GLUCOSE, CAPILLARY: Glucose-Capillary: 143 mg/dL — ABNORMAL HIGH (ref 70–99)

## 2011-09-03 LAB — GLUCOSE, CAPILLARY: Glucose-Capillary: 127 mg/dL — ABNORMAL HIGH (ref 70–99)

## 2011-09-10 LAB — GLUCOSE, CAPILLARY: Glucose-Capillary: 165 mg/dL — ABNORMAL HIGH (ref 70–99)

## 2011-09-11 LAB — GLUCOSE, CAPILLARY: Glucose-Capillary: 125 mg/dL — ABNORMAL HIGH (ref 70–99)

## 2011-09-13 LAB — GLUCOSE, CAPILLARY: Glucose-Capillary: 157 mg/dL — ABNORMAL HIGH (ref 70–99)

## 2011-09-14 LAB — GLUCOSE, CAPILLARY: Glucose-Capillary: 143 mg/dL — ABNORMAL HIGH (ref 70–99)

## 2011-09-15 LAB — GLUCOSE, CAPILLARY: Glucose-Capillary: 142 mg/dL — ABNORMAL HIGH (ref 70–99)

## 2011-09-16 LAB — GLUCOSE, CAPILLARY: Glucose-Capillary: 149 mg/dL — ABNORMAL HIGH (ref 70–99)

## 2011-09-22 LAB — GLUCOSE, CAPILLARY: Glucose-Capillary: 142 mg/dL — ABNORMAL HIGH (ref 70–99)

## 2011-09-23 LAB — GLUCOSE, CAPILLARY: Glucose-Capillary: 131 mg/dL — ABNORMAL HIGH (ref 70–99)

## 2011-09-25 LAB — GLUCOSE, CAPILLARY: Glucose-Capillary: 148 mg/dL — ABNORMAL HIGH (ref 70–99)

## 2011-09-26 LAB — GLUCOSE, CAPILLARY: Glucose-Capillary: 135 mg/dL — ABNORMAL HIGH (ref 70–99)

## 2011-09-30 LAB — GLUCOSE, CAPILLARY: Glucose-Capillary: 172 mg/dL — ABNORMAL HIGH (ref 70–99)

## 2011-10-02 LAB — GLUCOSE, CAPILLARY: Glucose-Capillary: 145 mg/dL — ABNORMAL HIGH (ref 70–99)

## 2011-10-03 LAB — GLUCOSE, CAPILLARY: Glucose-Capillary: 145 mg/dL — ABNORMAL HIGH (ref 70–99)

## 2011-10-05 LAB — GLUCOSE, CAPILLARY: Glucose-Capillary: 144 mg/dL — ABNORMAL HIGH (ref 70–99)

## 2011-10-07 LAB — GLUCOSE, CAPILLARY: Glucose-Capillary: 134 mg/dL — ABNORMAL HIGH (ref 70–99)

## 2011-10-09 LAB — GLUCOSE, CAPILLARY: Glucose-Capillary: 129 mg/dL — ABNORMAL HIGH (ref 70–99)

## 2011-10-13 LAB — GLUCOSE, CAPILLARY: Glucose-Capillary: 152 mg/dL — ABNORMAL HIGH (ref 70–99)

## 2011-10-15 LAB — GLUCOSE, CAPILLARY: Glucose-Capillary: 148 mg/dL — ABNORMAL HIGH (ref 70–99)

## 2011-10-16 LAB — GLUCOSE, CAPILLARY: Glucose-Capillary: 178 mg/dL — ABNORMAL HIGH (ref 70–99)

## 2011-10-17 LAB — GLUCOSE, CAPILLARY: Glucose-Capillary: 163 mg/dL — ABNORMAL HIGH (ref 70–99)

## 2011-10-19 LAB — GLUCOSE, CAPILLARY: Glucose-Capillary: 96 mg/dL (ref 70–99)

## 2011-10-21 LAB — GLUCOSE, CAPILLARY: Glucose-Capillary: 138 mg/dL — ABNORMAL HIGH (ref 70–99)

## 2011-10-22 ENCOUNTER — Ambulatory Visit (HOSPITAL_COMMUNITY)
Admit: 2011-10-22 | Discharge: 2011-10-22 | Disposition: A | Discharge status: Home / Self Care | Payer: Medicare HMO | Attending: Internal Medicine | Admitting: Internal Medicine

## 2011-10-22 DIAGNOSIS — I509 Heart failure, unspecified: Secondary | ICD-10-CM | POA: Insufficient documentation

## 2011-10-22 LAB — GLUCOSE, CAPILLARY: Glucose-Capillary: 102 mg/dL — ABNORMAL HIGH (ref 70–99)

## 2011-10-29 LAB — GLUCOSE, CAPILLARY: Glucose-Capillary: 136 mg/dL — ABNORMAL HIGH (ref 70–99)

## 2011-10-30 LAB — GLUCOSE, CAPILLARY: Glucose-Capillary: 128 mg/dL — ABNORMAL HIGH (ref 70–99)

## 2011-11-01 LAB — GLUCOSE, CAPILLARY: Glucose-Capillary: 168 mg/dL — ABNORMAL HIGH (ref 70–99)

## 2011-11-02 LAB — GLUCOSE, CAPILLARY: Glucose-Capillary: 117 mg/dL — ABNORMAL HIGH (ref 70–99)

## 2011-11-07 LAB — GLUCOSE, CAPILLARY: Glucose-Capillary: 156 mg/dL — ABNORMAL HIGH (ref 70–99)

## 2011-11-09 LAB — GLUCOSE, CAPILLARY: Glucose-Capillary: 150 mg/dL — ABNORMAL HIGH (ref 70–99)

## 2011-11-10 LAB — GLUCOSE, CAPILLARY: Glucose-Capillary: 149 mg/dL — ABNORMAL HIGH (ref 70–99)

## 2011-11-12 LAB — GLUCOSE, CAPILLARY: Glucose-Capillary: 164 mg/dL — ABNORMAL HIGH (ref 70–99)

## 2011-11-13 LAB — GLUCOSE, CAPILLARY: Glucose-Capillary: 149 mg/dL — ABNORMAL HIGH (ref 70–99)

## 2011-11-19 LAB — GLUCOSE, CAPILLARY: Glucose-Capillary: 170 mg/dL — ABNORMAL HIGH (ref 70–99)

## 2011-11-21 LAB — GLUCOSE, CAPILLARY: Glucose-Capillary: 154 mg/dL — ABNORMAL HIGH (ref 70–99)

## 2011-11-22 LAB — GLUCOSE, CAPILLARY: Glucose-Capillary: 134 mg/dL — ABNORMAL HIGH (ref 70–99)

## 2011-11-23 LAB — GLUCOSE, CAPILLARY: Glucose-Capillary: 152 mg/dL — ABNORMAL HIGH (ref 70–99)

## 2011-11-24 LAB — GLUCOSE, CAPILLARY: Glucose-Capillary: 130 mg/dL — ABNORMAL HIGH (ref 70–99)

## 2011-12-02 LAB — GLUCOSE, CAPILLARY: Glucose-Capillary: 133 mg/dL — ABNORMAL HIGH (ref 70–99)

## 2011-12-03 LAB — GLUCOSE, CAPILLARY: Glucose-Capillary: 145 mg/dL — ABNORMAL HIGH (ref 70–99)

## 2011-12-05 LAB — GLUCOSE, CAPILLARY: Glucose-Capillary: 164 mg/dL — ABNORMAL HIGH (ref 70–99)

## 2011-12-06 LAB — GLUCOSE, CAPILLARY: Glucose-Capillary: 123 mg/dL — ABNORMAL HIGH (ref 70–99)

## 2011-12-14 LAB — GLUCOSE, CAPILLARY: Glucose-Capillary: 139 mg/dL — ABNORMAL HIGH (ref 70–99)

## 2011-12-17 LAB — GLUCOSE, CAPILLARY: Glucose-Capillary: 131 mg/dL — ABNORMAL HIGH (ref 70–99)

## 2011-12-18 LAB — GLUCOSE, CAPILLARY: Glucose-Capillary: 140 mg/dL — ABNORMAL HIGH (ref 70–99)

## 2011-12-19 LAB — GLUCOSE, CAPILLARY: Glucose-Capillary: 114 mg/dL — ABNORMAL HIGH (ref 70–99)

## 2011-12-21 LAB — GLUCOSE, CAPILLARY: Glucose-Capillary: 157 mg/dL — ABNORMAL HIGH (ref 70–99)

## 2011-12-22 LAB — GLUCOSE, CAPILLARY: Glucose-Capillary: 149 mg/dL — ABNORMAL HIGH (ref 70–99)

## 2011-12-25 LAB — GLUCOSE, CAPILLARY: Glucose-Capillary: 119 mg/dL — ABNORMAL HIGH (ref 70–99)

## 2011-12-28 LAB — GLUCOSE, CAPILLARY: Glucose-Capillary: 145 mg/dL — ABNORMAL HIGH (ref 70–99)

## 2011-12-29 LAB — GLUCOSE, CAPILLARY: Glucose-Capillary: 124 mg/dL — ABNORMAL HIGH (ref 70–99)

## 2011-12-30 LAB — GLUCOSE, CAPILLARY: Glucose-Capillary: 124 mg/dL — ABNORMAL HIGH (ref 70–99)

## 2012-01-01 LAB — GLUCOSE, CAPILLARY: Glucose-Capillary: 143 mg/dL — ABNORMAL HIGH (ref 70–99)

## 2012-01-02 LAB — GLUCOSE, CAPILLARY: Glucose-Capillary: 121 mg/dL — ABNORMAL HIGH (ref 70–99)

## 2012-01-04 LAB — GLUCOSE, CAPILLARY: Glucose-Capillary: 127 mg/dL — ABNORMAL HIGH (ref 70–99)

## 2012-01-05 LAB — GLUCOSE, CAPILLARY: Glucose-Capillary: 152 mg/dL — ABNORMAL HIGH (ref 70–99)

## 2012-01-07 LAB — GLUCOSE, CAPILLARY: Glucose-Capillary: 131 mg/dL — ABNORMAL HIGH (ref 70–99)

## 2012-01-08 LAB — GLUCOSE, CAPILLARY: Glucose-Capillary: 142 mg/dL — ABNORMAL HIGH (ref 70–99)

## 2012-01-16 LAB — GLUCOSE, CAPILLARY: Glucose-Capillary: 103 mg/dL — ABNORMAL HIGH (ref 70–99)

## 2012-01-17 LAB — GLUCOSE, CAPILLARY: Glucose-Capillary: 140 mg/dL — ABNORMAL HIGH (ref 70–99)

## 2012-01-22 LAB — GLUCOSE, CAPILLARY: Glucose-Capillary: 112 mg/dL — ABNORMAL HIGH (ref 70–99)

## 2012-01-25 LAB — GLUCOSE, CAPILLARY: Glucose-Capillary: 140 mg/dL — ABNORMAL HIGH (ref 70–99)

## 2012-02-01 LAB — GLUCOSE, CAPILLARY: Glucose-Capillary: 146 mg/dL — ABNORMAL HIGH (ref 70–99)

## 2012-02-02 LAB — GLUCOSE, CAPILLARY: Glucose-Capillary: 133 mg/dL — ABNORMAL HIGH (ref 70–99)

## 2012-02-04 LAB — GLUCOSE, CAPILLARY: Glucose-Capillary: 115 mg/dL — ABNORMAL HIGH (ref 70–99)

## 2012-02-05 LAB — GLUCOSE, CAPILLARY: Glucose-Capillary: 169 mg/dL — ABNORMAL HIGH (ref 70–99)

## 2012-02-18 LAB — GLUCOSE, CAPILLARY: Glucose-Capillary: 134 mg/dL — ABNORMAL HIGH (ref 70–99)

## 2012-02-19 LAB — GLUCOSE, CAPILLARY: Glucose-Capillary: 121 mg/dL — ABNORMAL HIGH (ref 70–99)

## 2012-02-21 LAB — GLUCOSE, CAPILLARY: Glucose-Capillary: 150 mg/dL — ABNORMAL HIGH (ref 70–99)

## 2012-02-22 LAB — GLUCOSE, CAPILLARY: Glucose-Capillary: 159 mg/dL — ABNORMAL HIGH (ref 70–99)

## 2012-02-26 LAB — GLUCOSE, CAPILLARY: Glucose-Capillary: 140 mg/dL — ABNORMAL HIGH (ref 70–99)

## 2012-02-28 LAB — GLUCOSE, CAPILLARY: Glucose-Capillary: 138 mg/dL — ABNORMAL HIGH (ref 70–99)

## 2012-02-29 LAB — GLUCOSE, CAPILLARY: Glucose-Capillary: 142 mg/dL — ABNORMAL HIGH (ref 70–99)

## 2012-03-02 LAB — GLUCOSE, CAPILLARY: Glucose-Capillary: 157 mg/dL — ABNORMAL HIGH (ref 70–99)

## 2012-03-03 LAB — GLUCOSE, CAPILLARY: Glucose-Capillary: 146 mg/dL — ABNORMAL HIGH (ref 70–99)

## 2012-03-06 LAB — GLUCOSE, CAPILLARY: Glucose-Capillary: 108 mg/dL — ABNORMAL HIGH (ref 70–99)

## 2012-03-08 LAB — GLUCOSE, CAPILLARY
Glucose-Capillary: 150 mg/dL — ABNORMAL HIGH (ref 70–99)
Glucose-Capillary: 131 mg/dL — ABNORMAL HIGH (ref 70–99)

## 2012-03-09 LAB — GLUCOSE, CAPILLARY: Glucose-Capillary: 115 mg/dL — ABNORMAL HIGH (ref 70–99)

## 2012-03-10 LAB — GLUCOSE, CAPILLARY: Glucose-Capillary: 124 mg/dL — ABNORMAL HIGH (ref 70–99)

## 2012-03-13 LAB — GLUCOSE, CAPILLARY: Glucose-Capillary: 139 mg/dL — ABNORMAL HIGH (ref 70–99)

## 2012-03-16 LAB — GLUCOSE, CAPILLARY: Glucose-Capillary: 167 mg/dL — ABNORMAL HIGH (ref 70–99)

## 2012-03-17 LAB — GLUCOSE, CAPILLARY: Glucose-Capillary: 159 mg/dL — ABNORMAL HIGH (ref 70–99)

## 2012-03-18 LAB — GLUCOSE, CAPILLARY: Glucose-Capillary: 139 mg/dL — ABNORMAL HIGH (ref 70–99)

## 2012-03-19 LAB — GLUCOSE, CAPILLARY: Glucose-Capillary: 151 mg/dL — ABNORMAL HIGH (ref 70–99)

## 2012-03-20 LAB — GLUCOSE, CAPILLARY: Glucose-Capillary: 160 mg/dL — ABNORMAL HIGH (ref 70–99)

## 2012-03-21 LAB — GLUCOSE, CAPILLARY: Glucose-Capillary: 139 mg/dL — ABNORMAL HIGH (ref 70–99)

## 2012-03-24 LAB — GLUCOSE, CAPILLARY: Glucose-Capillary: 173 mg/dL — ABNORMAL HIGH (ref 70–99)

## 2012-03-27 ENCOUNTER — Emergency Department (HOSPITAL_COMMUNITY): Payer: Medicare HMO

## 2012-03-27 ENCOUNTER — Encounter (HOSPITAL_COMMUNITY): Payer: Self-pay | Admitting: Emergency Medicine

## 2012-03-27 ENCOUNTER — Inpatient Hospital Stay (HOSPITAL_COMMUNITY)
Admission: EM | Admit: 2012-03-27 | Discharge: 2012-03-30 | DRG: 682 | Disposition: A | Discharge status: Skilled Nursing Facility | Payer: Medicare HMO | Source: Ambulatory Visit | Attending: Internal Medicine | Admitting: Internal Medicine

## 2012-03-27 ENCOUNTER — Other Ambulatory Visit: Payer: Self-pay

## 2012-03-27 DIAGNOSIS — R410 Disorientation, unspecified: Secondary | ICD-10-CM

## 2012-03-27 DIAGNOSIS — L308 Other specified dermatitis: Secondary | ICD-10-CM

## 2012-03-27 DIAGNOSIS — N179 Acute kidney failure, unspecified: Principal | ICD-10-CM

## 2012-03-27 DIAGNOSIS — R9431 Abnormal electrocardiogram [ECG] [EKG]: Secondary | ICD-10-CM

## 2012-03-27 DIAGNOSIS — I10 Essential (primary) hypertension: Secondary | ICD-10-CM

## 2012-03-27 DIAGNOSIS — S72009A Fracture of unspecified part of neck of unspecified femur, initial encounter for closed fracture: Secondary | ICD-10-CM

## 2012-03-27 DIAGNOSIS — E860 Dehydration: Secondary | ICD-10-CM

## 2012-03-27 DIAGNOSIS — L259 Unspecified contact dermatitis, unspecified cause: Secondary | ICD-10-CM | POA: Diagnosis present

## 2012-03-27 DIAGNOSIS — Z79899 Other long term (current) drug therapy: Secondary | ICD-10-CM

## 2012-03-27 DIAGNOSIS — L299 Pruritus, unspecified: Secondary | ICD-10-CM | POA: Diagnosis present

## 2012-03-27 DIAGNOSIS — I482 Chronic atrial fibrillation, unspecified: Secondary | ICD-10-CM

## 2012-03-27 DIAGNOSIS — Z882 Allergy status to sulfonamides status: Secondary | ICD-10-CM

## 2012-03-27 DIAGNOSIS — Z96649 Presence of unspecified artificial hip joint: Secondary | ICD-10-CM

## 2012-03-27 DIAGNOSIS — K219 Gastro-esophageal reflux disease without esophagitis: Secondary | ICD-10-CM

## 2012-03-27 DIAGNOSIS — T463X5A Adverse effect of coronary vasodilators, initial encounter: Secondary | ICD-10-CM | POA: Diagnosis present

## 2012-03-27 DIAGNOSIS — F079 Unspecified personality and behavioral disorder due to known physiological condition: Secondary | ICD-10-CM | POA: Diagnosis present

## 2012-03-27 DIAGNOSIS — I440 Atrioventricular block, first degree: Secondary | ICD-10-CM | POA: Diagnosis present

## 2012-03-27 DIAGNOSIS — E875 Hyperkalemia: Secondary | ICD-10-CM

## 2012-03-27 DIAGNOSIS — Z9181 History of falling: Secondary | ICD-10-CM

## 2012-03-27 DIAGNOSIS — N390 Urinary tract infection, site not specified: Secondary | ICD-10-CM

## 2012-03-27 DIAGNOSIS — F03C0 Unspecified dementia, severe, without behavioral disturbance, psychotic disturbance, mood disturbance, and anxiety: Secondary | ICD-10-CM

## 2012-03-27 DIAGNOSIS — Z7982 Long term (current) use of aspirin: Secondary | ICD-10-CM

## 2012-03-27 DIAGNOSIS — R262 Difficulty in walking, not elsewhere classified: Secondary | ICD-10-CM | POA: Diagnosis present

## 2012-03-27 DIAGNOSIS — I498 Other specified cardiac arrhythmias: Secondary | ICD-10-CM | POA: Diagnosis present

## 2012-03-27 DIAGNOSIS — N289 Disorder of kidney and ureter, unspecified: Secondary | ICD-10-CM

## 2012-03-27 DIAGNOSIS — E119 Type 2 diabetes mellitus without complications: Secondary | ICD-10-CM

## 2012-03-27 DIAGNOSIS — J449 Chronic obstructive pulmonary disease, unspecified: Secondary | ICD-10-CM

## 2012-03-27 DIAGNOSIS — R001 Bradycardia, unspecified: Secondary | ICD-10-CM | POA: Diagnosis present

## 2012-03-27 DIAGNOSIS — G9341 Metabolic encephalopathy: Secondary | ICD-10-CM

## 2012-03-27 DIAGNOSIS — F09 Unspecified mental disorder due to known physiological condition: Secondary | ICD-10-CM

## 2012-03-27 DIAGNOSIS — R5381 Other malaise: Secondary | ICD-10-CM

## 2012-03-27 DIAGNOSIS — Z7401 Bed confinement status: Secondary | ICD-10-CM

## 2012-03-27 DIAGNOSIS — T465X5A Adverse effect of other antihypertensive drugs, initial encounter: Secondary | ICD-10-CM | POA: Diagnosis present

## 2012-03-27 DIAGNOSIS — I4891 Unspecified atrial fibrillation: Secondary | ICD-10-CM | POA: Diagnosis present

## 2012-03-27 DIAGNOSIS — F0390 Unspecified dementia without behavioral disturbance: Secondary | ICD-10-CM | POA: Diagnosis present

## 2012-03-27 LAB — CBC WITH DIFFERENTIAL/PLATELET
Basophils Absolute: 0.1 10*3/uL (ref 0.0–0.1)
Basophils Relative: 1 % (ref 0–1)
Eosinophils Absolute: 0.6 10*3/uL (ref 0.0–0.7)
Eosinophils Relative: 6 % — ABNORMAL HIGH (ref 0–5)
HCT: 34.8 % — ABNORMAL LOW (ref 36.0–46.0)
Hemoglobin: 11.5 g/dL — ABNORMAL LOW (ref 12.0–15.0)
Lymphocytes Relative: 29 % (ref 12–46)
Lymphs Abs: 2.8 10*3/uL (ref 0.7–4.0)
MCH: 30.6 pg (ref 26.0–34.0)
MCHC: 33 g/dL (ref 30.0–36.0)
MCV: 92.6 fL (ref 78.0–100.0)
Monocytes Absolute: 0.7 10*3/uL (ref 0.1–1.0)
Monocytes Relative: 7 % (ref 3–12)
Neutro Abs: 5.5 10*3/uL (ref 1.7–7.7)
Neutrophils Relative %: 57 % (ref 43–77)
Platelets: 284 10*3/uL (ref 150–400)
RBC: 3.76 MIL/uL — ABNORMAL LOW (ref 3.87–5.11)
RDW: 13.1 % (ref 11.5–15.5)
WBC: 9.6 10*3/uL (ref 4.0–10.5)

## 2012-03-27 LAB — BASIC METABOLIC PANEL
BUN: 41 mg/dL — ABNORMAL HIGH (ref 6–23)
CO2: 23 mEq/L (ref 19–32)
Calcium: 8.9 mg/dL (ref 8.4–10.5)
Chloride: 103 mEq/L (ref 96–112)
Creatinine, Ser: 1.95 mg/dL — ABNORMAL HIGH (ref 0.50–1.10)
GFR calc Af Amer: 26 mL/min — ABNORMAL LOW (ref >90)
GFR calc non Af Amer: 22 mL/min — ABNORMAL LOW (ref >90)
Glucose, Bld: 176 mg/dL — ABNORMAL HIGH (ref 70–99)
Potassium: 5.8 mEq/L — ABNORMAL HIGH (ref 3.5–5.1)
Sodium: 134 mEq/L — ABNORMAL LOW (ref 135–145)

## 2012-03-27 LAB — HEPATIC FUNCTION PANEL
ALT: 9 U/L (ref 0–35)
Alkaline Phosphatase: 116 U/L (ref 39–117)
Total Protein: 6.7 g/dL (ref 6.0–8.3)

## 2012-03-27 LAB — URINALYSIS, ROUTINE W REFLEX MICROSCOPIC
Glucose, UA: NEGATIVE mg/dL
Ketones, ur: 15 mg/dL — AB
Nitrite: NEGATIVE
Specific Gravity, Urine: >1.03 — ABNORMAL HIGH (ref 1.005–1.030)
pH: 5.5 (ref 5.0–8.0)

## 2012-03-27 LAB — URINE MICROSCOPIC-ADD ON

## 2012-03-27 LAB — TROPONIN I: Troponin I: <0.3 ng/mL (ref <0.30)

## 2012-03-27 LAB — GLUCOSE, CAPILLARY
Glucose-Capillary: 137 mg/dL — ABNORMAL HIGH (ref 70–99)
Glucose-Capillary: 184 mg/dL — ABNORMAL HIGH (ref 70–99)

## 2012-03-27 LAB — LACTIC ACID, PLASMA: Lactic Acid, Venous: 1.3 mmol/L (ref 0.5–2.2)

## 2012-03-27 LAB — MAGNESIUM: Magnesium: 2 mg/dL (ref 1.5–2.5)

## 2012-03-27 MED ORDER — TRAZODONE HCL 50 MG PO TABS
25.0000 mg | ORAL_TABLET | Freq: Every evening | ORAL | Status: DC | PRN
  Administered 2012-03-27: 25 mg via ORAL
  Administered 2012-03-28: 50 mg via ORAL
  Administered 2012-03-29: 25 mg via ORAL
  Filled 2012-03-27 (×3): qty 1

## 2012-03-27 MED ORDER — SENNOSIDES 8.6 MG PO TABS: 1.0000 | ORAL_TABLET | Freq: Every evening | ORAL | Status: DC | PRN

## 2012-03-27 MED ORDER — FLEET ENEMA 7-19 GM/118ML RE ENEM: 1.0000 | ENEMA | Freq: Once | RECTAL | Status: AC | PRN

## 2012-03-27 MED ORDER — ATROPINE SULFATE 0.1 MG/ML IJ SOLN
INTRAMUSCULAR | Status: AC
  Administered 2012-03-27: 1 mg
  Filled 2012-03-27: qty 10

## 2012-03-27 MED ORDER — DIPHENHYDRAMINE HCL 50 MG/ML IJ SOLN
12.5000 mg | Freq: Once | INTRAMUSCULAR | Status: AC
  Administered 2012-03-27: 12.5 mg via INTRAVENOUS
  Filled 2012-03-27: qty 1

## 2012-03-27 MED ORDER — DEXTROSE 50 % IV SOLN
INTRAVENOUS | Status: AC
  Filled 2012-03-27: qty 50

## 2012-03-27 MED ORDER — SODIUM CHLORIDE 0.9 % IV SOLN
INTRAVENOUS | Status: DC
  Administered 2012-03-27: 20:00:00 via INTRAVENOUS

## 2012-03-27 MED ORDER — SODIUM CHLORIDE 0.9 % IJ SOLN
3.0000 mL | Freq: Two times a day (BID) | INTRAMUSCULAR | Status: DC
  Administered 2012-03-27 – 2012-03-28 (×3): 3 mL via INTRAVENOUS
  Filled 2012-03-27 (×4): qty 3

## 2012-03-27 MED ORDER — ONDANSETRON HCL 4 MG PO TABS: 4.0000 mg | ORAL_TABLET | Freq: Four times a day (QID) | ORAL | Status: DC | PRN

## 2012-03-27 MED ORDER — DOCUSATE SODIUM 100 MG PO CAPS
100.0000 mg | ORAL_CAPSULE | Freq: Two times a day (BID) | ORAL | Status: DC
  Administered 2012-03-27 – 2012-03-30 (×6): 100 mg via ORAL
  Filled 2012-03-27 (×7): qty 1

## 2012-03-27 MED ORDER — ASPIRIN 325 MG PO TABS
325.0000 mg | ORAL_TABLET | Freq: Every day | ORAL | Status: DC
  Administered 2012-03-28 – 2012-03-30 (×3): 325 mg via ORAL
  Filled 2012-03-27 (×3): qty 1

## 2012-03-27 MED ORDER — DIPHENHYDRAMINE HCL 25 MG PO CAPS
25.0000 mg | ORAL_CAPSULE | Freq: Four times a day (QID) | ORAL | Status: DC | PRN
  Administered 2012-03-28 – 2012-03-29 (×3): 25 mg via ORAL
  Filled 2012-03-27 (×4): qty 1

## 2012-03-27 MED ORDER — CALCIUM CHLORIDE 10 % IV SOLN
1.0000 g | Freq: Once | INTRAVENOUS | Status: AC
  Administered 2012-03-27: 1 g via INTRAVENOUS
  Filled 2012-03-27: qty 10

## 2012-03-27 MED ORDER — SENNA 8.6 MG PO TABS: 1.0000 | ORAL_TABLET | Freq: Every evening | ORAL | Status: DC | PRN

## 2012-03-27 MED ORDER — DEXTROSE 50 % IV SOLN: 1.0000 | Freq: Once | INTRAVENOUS | Status: DC

## 2012-03-27 MED ORDER — INSULIN ASPART 100 UNIT/ML ~~LOC~~ SOLN
10.0000 [IU] | Freq: Once | SUBCUTANEOUS | Status: DC
  Filled 2012-03-27: qty 1

## 2012-03-27 MED ORDER — ONDANSETRON HCL 4 MG/2ML IJ SOLN: 4.0000 mg | Freq: Four times a day (QID) | INTRAMUSCULAR | Status: DC | PRN

## 2012-03-27 MED ORDER — FAMOTIDINE 20 MG PO TABS
20.0000 mg | ORAL_TABLET | Freq: Two times a day (BID) | ORAL | Status: DC
  Administered 2012-03-27 – 2012-03-30 (×6): 20 mg via ORAL
  Filled 2012-03-27 (×6): qty 1

## 2012-03-27 MED ORDER — LORAZEPAM 0.5 MG PO TABS
0.5000 mg | ORAL_TABLET | Freq: Three times a day (TID) | ORAL | Status: DC | PRN
  Administered 2012-03-27 – 2012-03-28 (×2): 0.5 mg via ORAL
  Filled 2012-03-27 (×2): qty 1

## 2012-03-27 MED ORDER — ALBUTEROL SULFATE (5 MG/ML) 0.5% IN NEBU: 10.0000 mg | INHALATION_SOLUTION | Freq: Once | RESPIRATORY_TRACT | Status: DC

## 2012-03-27 MED ORDER — ACETAMINOPHEN 325 MG PO TABS: 650.0000 mg | ORAL_TABLET | ORAL | Status: DC | PRN

## 2012-03-27 MED ORDER — ENOXAPARIN SODIUM 30 MG/0.3ML ~~LOC~~ SOLN
30.0000 mg | SUBCUTANEOUS | Status: DC
  Administered 2012-03-27: 30 mg via SUBCUTANEOUS
  Filled 2012-03-27: qty 0.3

## 2012-03-27 MED ORDER — CEFTRIAXONE SODIUM 1 G IJ SOLR
1.0000 g | INTRAMUSCULAR | Status: DC
  Administered 2012-03-27 – 2012-03-29 (×3): 1 g via INTRAVENOUS
  Filled 2012-03-27 (×3): qty 10

## 2012-03-27 MED ORDER — HYDROMORPHONE HCL PF 1 MG/ML IJ SOLN: 0.5000 mg | INTRAMUSCULAR | Status: DC | PRN

## 2012-03-27 MED ORDER — CALCIUM CARBONATE ANTACID 500 MG PO CHEW
1.0000 | CHEWABLE_TABLET | Freq: Two times a day (BID) | ORAL | Status: DC
  Administered 2012-03-27 – 2012-03-30 (×6): 200 mg via ORAL
  Filled 2012-03-27 (×6): qty 1

## 2012-03-27 MED ORDER — PROPAFENONE HCL 225 MG PO TABS
225.0000 mg | ORAL_TABLET | Freq: Two times a day (BID) | ORAL | Status: DC
  Administered 2012-03-28 – 2012-03-30 (×5): 225 mg via ORAL
  Filled 2012-03-27 (×8): qty 1

## 2012-03-27 MED ORDER — DEXTROSE 50 % IV SOLN: 50.0000 mL | Freq: Once | INTRAVENOUS | Status: DC

## 2012-03-27 MED ORDER — INSULIN REGULAR HUMAN 100 UNIT/ML IJ SOLN: 10.0000 [IU] | Freq: Once | INTRAMUSCULAR | Status: DC

## 2012-03-27 MED ORDER — OXYCODONE HCL 5 MG PO TABS
5.0000 mg | ORAL_TABLET | ORAL | Status: DC | PRN
  Administered 2012-03-29: 5 mg via ORAL
  Filled 2012-03-27: qty 1

## 2012-03-27 MED ORDER — LEVALBUTEROL HCL 0.63 MG/3ML IN NEBU: 0.6300 mg | INHALATION_SOLUTION | Freq: Four times a day (QID) | RESPIRATORY_TRACT | Status: DC

## 2012-03-27 MED ORDER — SODIUM CHLORIDE 0.9 % IV SOLN
INTRAVENOUS | Status: DC
  Administered 2012-03-27 – 2012-03-30 (×8): via INTRAVENOUS

## 2012-03-27 NOTE — ED Notes (Signed)
Since receiving the atropine pt has slowly become more alert. Pt is now sitting up with eyes open and is responsive to verbal stimuli. Pt is able to answer all questions. Pt denies any pain at this time.

## 2012-03-27 NOTE — ED Notes (Signed)
Pt was placed on monitor at this time [name]

## 2012-03-27 NOTE — ED Notes (Signed)
Pt comes to ED from [facility] after being found lethargic and clammy. Upon EMS arrival pt's HR was 42. Pt is not responsive to verbal or painful stimuli on arrival.

## 2012-03-27 NOTE — H&P (Signed)
[facility] History and Physical  [name]  [name]:[id number]  DOB: 01/01/1925   DOA: 03/27/2012   PCP:   Terald Sleeper, MD   Chief Complaint:  Unresponsiveness since today  HPI: [name] is an 78 y.o. female.  Caucasian lady, resident of the [facility] skilled nursing facility,  bedridden by orthopedic problems, no has advanced dementia and has to be constantly reminded to drink, was brought to the emergency room by her daughter because of unresponsiveness. After 1-1/2 L of IV  fluid in the emergency room patient became much more alert. Daughter reports she gets like this whenever she gets dehydrated and is not getting enough to drink. In the emergency room patient was noted to be markedly bradycardic in the 30s, and received atropine, the lab work shows her to have evidence of dehydration. Hospitalist service was called to assist with management. She has a history of A. fib Patient's medications include diltiazem, Lopressor, lisinopril, potassium, and Vicodin  Is no history of fever cough or cold chest pains or shortness of breath. Because of her bilateral hip surgeries, from which she failed to rehabilitate because of her dementia, she is mostly bed or wheelchair bound. She suffers from a chronic pruritic condition which cause her to the continually scratching at her legs  Patient is unable to contribute to the history otherwise.  Her daughter visits her frequently and encourages the staff to always give her something to drink regular interval, she reports this is not always followed, especially with new staff regularly coming in.  Rewiew of Systems:  Unable to obtain    Past Medical History  Diagnosis Date  . Falls frequently   . Cardiac dysrhythmia   . Anemia   . Organic brain syndrome   . Difficulty walking   . Acute renal failure   . Azotemia   . Hyperkalemia   . UTI (lower urinary tract infection)   . Syncope   . Hypertension   . Acid reflux      Past Surgical History  Procedure Date  . Joint replacement     right hip  . Joint replacement     left hip 2012    Medications:  HOME MEDS: Prior to Admission medications   Medication Sig Start Date End Date Taking? Authorizing Provider  acetaminophen (TYLENOL) 325 MG tablet Take 650 mg by mouth every 4 (four) hours as needed. Pain.   Yes Historical Provider, MD  aspirin 325 MG tablet Take 325 mg by mouth daily.     Yes Historical Provider, MD  calcium carbonate (TUMS - DOSED IN MG ELEMENTAL CALCIUM) 500 MG chewable tablet Chew 1 tablet by mouth 2 (two) times daily.    Yes Historical Provider, MD  Cholecalciferol (VITAMIN D-3) 1000 UNITS CAPS Take 2 capsules by mouth daily.   Yes Historical Provider, MD  diltiazem (CARDIZEM CD) 180 MG 24 hr capsule Take 360 mg by mouth daily.     Yes Historical Provider, MD  diphenhydrAMINE (BENADRYL) 25 MG tablet Take 25 mg by mouth every 6 (six) hours as needed. Itching.   Yes Historical Provider, MD  docusate sodium (COLACE) 100 MG capsule Take 100 mg by mouth 2 (two) times daily.    Yes Historical Provider, MD  glipiZIDE (GLUCOTROL XL) 2.5 MG 24 hr tablet Take 2.5 mg by mouth daily.     Yes Historical Provider, MD  HYDROcodone-acetaminophen (VICODIN) 5-500 MG per tablet Take 1 tablet by mouth every 4 (four) hours as needed. Pain.  Yes Historical Provider, MD  ipratropium-albuterol (DUONEB) 0.5-2.5 (3) MG/3ML SOLN Take 3 mLs by nebulization every 6 (six) hours as needed. SOB/Wheezing    Yes Historical Provider, MD  lisinopril (PRINIVIL,ZESTRIL) 30 MG tablet Take 30 mg by mouth daily.   Yes Historical Provider, MD  LORazepam (ATIVAN) 0.5 MG tablet Take 0.5 mg by mouth every 8 (eight) hours as needed. Anxiety.   Yes Historical Provider, MD  memantine (NAMENDA) 10 MG tablet Take 10 mg by mouth 2 (two) times daily.     Yes Historical Provider, MD  metoprolol (LOPRESSOR) 50 MG tablet Take 50 mg by mouth 2 (two) times daily.   Yes Historical Provider,  MD  potassium chloride (K-DUR,KLOR-CON) 10 MEQ tablet Take 10 mEq by mouth 2 (two) times daily.   Yes Historical Provider, MD  propafenone (RYTHMOL) 225 MG tablet Take 225 mg by mouth 2 (two) times daily.     Yes Historical Provider, MD  ranitidine (ZANTAC) 150 MG tablet Take 150 mg by mouth 2 (two) times daily. Heartburn.   Yes Historical Provider, MD  senna (SENOKOT) 8.6 MG tablet Take 1 tablet by mouth 2 (two) times daily as needed. Constipation.   Yes Historical Provider, MD     Allergies:  Allergies  Allergen Reactions  . Sulfa Antibiotics Hives    Social History:   reports that she has never smoked. She does not have any smokeless tobacco history on file. She reports that she does not drink alcohol or use illicit drugs.  Family History: Family History  Problem Relation Age of Onset  . Diabetes Mother   . Dementia Brother   . Dementia Brother   . Fibromyalgia Daughter      Physical Exam: Filed Vitals:   03/27/12 2130 03/27/12 2145 03/27/12 2200 03/27/12 2217  BP: 126/45 119/102 117/87   Pulse:      Temp:    98.5 F (36.9 C)  TempSrc:    Oral  Resp: 23     SpO2:       Blood pressure 117/87, pulse 45, temperature 98.5 F (36.9 C), temperature source Oral, resp. rate 23, SpO2 100.00%.  GEN:  Pleasant demented elderly Caucasian lady lying flat in the stretcher in no acute distress; cooperative with exam PSYCH:  alert and oriented x4; does not appear anxious or depressed; affect is appropriate. HEENT: Mucous membranes pink , dry  ,and anicteric; PERRLA; EOM intact; no cervical lymphadenopathy nor thyromegaly or carotid bruit; no JVD; Breasts:: Not examined CHEST WALL: No tenderness CHEST: Normal respiration, clear to auscultation bilaterally HEART: Bradycardic regular rhythm; no murmurs rubs or gallops ABDOMEN: Obese, soft non-tender; no masses, no organomegaly, normal abdominal bowel sounds; no pannus; no intertriginous candida. Rectal Exam: Not done EXTREMITIES:   age-appropriate arthropathy of the hands and knees; no edema;  papular ulcerations of both shins where she's been scratching  Genitalia: not examined PULSES: 2+ and symmetric SKIN: Normal hydration no rash or ulceration CNS: Cranial nerves 2-12 grossly intact no focal lateralizing neurologic deficit   Labs on Admission:  Basic Metabolic Panel:  Lab 03/27/12 1610  NA 134*  K 5.8*  CL 103  CO2 23  GLUCOSE 176*  BUN 41*  CREATININE 1.95*  CALCIUM 8.9  MG 2.0  PHOS --   Liver Function Tests: No results found for this basename: AST:5,ALT:5,ALKPHOS:5,BILITOT:5,PROT:5,ALBUMIN:5 in the last 168 hours No results found for this basename: LIPASE:5,AMYLASE:5 in the last 168 hours No results found for this basename: AMMONIA:5 in the last 168 hours CBC:  Lab 03/27/12 1946  WBC 9.6  NEUTROABS 5.5  HGB 11.5*  HCT 34.8*  MCV 92.6  PLT 284   Cardiac Enzymes:  Lab 03/27/12 1946  CKTOTAL --  CKMB --  CKMBINDEX --  TROPONINI <0.30   BNP: No components found with this basename: POCBNP:5 D-dimer: No components found with this basename: D-DIMER:5 CBG:  Lab 03/27/12 1834 03/27/12 0503 03/26/12 0504 03/25/12 0437 03/24/12 0616  GLUCAP 184* 137* 118* 137* 173*    Radiological Exams on Admission: Dg Chest Port 1 View  03/27/2012  *RADIOLOGY REPORT*  Clinical Data: Bradycardia  PORTABLE CHEST - 1 VIEW  Comparison: 10/22/2011  Findings: COPD.  Negative for heart failure.  Negative for pneumonia or effusion.  Lungs are clear.  IMPRESSION: No acute cardiopulmonary disease.   Original Report Authenticated By: Camelia Phenes, M.D.     EKG: Independently reviewed. Sinus bradycardia  Assessment/Plan Present on Admission:  .Acute renal failure .Dehydration .Organic brain syndrome .Diabetes mellitus type 2 in nonobese .Chronic a-fib, currently in sinus rhythm  .Metabolic encephalopathy .Pruritic dermatitis .Hyperkalemia .UTI (lower urinary tract infection) .Bradycardia, due to toxic  effects of diltiazem and Lopressor in the dehydrated state   PLAN: -Agree with calcium IV, to reverse the effects of diltiazem and protect the heart  - will treat her acute renal failure and hyperkalemia with vigorous hydration; will not use any other modalities to treat her hyperkalemia since he will likely lead to hypokalemia once she is hydrated. -Rocephin for urinary tract infection pending cultures - Will hold her rate controlling medication, nephrotoxic drug, and sedatives, ontill her renal function is back to baseline  Other plans as per orders.  Code Status: FULL CODE  Family Communication: Daughter present at bedside for  exam ; plans discussed  Disposition Plan:Back to nursing home in the morning if cardiovascular and biochemical deficits correct    Wyndham Santilli Nocturnist [facility] Pager [phone]   03/27/2012, 10:47 PM

## 2012-03-27 NOTE — ED Provider Notes (Signed)
History     CSN: [id number]  Arrival date & time 03/27/12  1908   First MD Initiated Contact with Patient 03/27/12 1910      Chief Complaint  Patient presents with  . Bradycardia     The history is provided by the nursing home and a relative. History Limited By: Hx dementia.  Pt was seen at 1910.  Per EMS NH report and family, pt was found by NH staff lethargic and clammy PTA.  EMS states on their arrival to scene, they found pt's HR "in the 30's."  Family states "she gets like this when she gets dehydrated." Pt with significant hx of dementia.      Past Medical History  Diagnosis Date  . Falls frequently   . Cardiac dysrhythmia   . Anemia   . Organic brain syndrome   . Difficulty walking   . Acute renal failure   . Azotemia   . Hyperkalemia   . UTI (lower urinary tract infection)   . Syncope   . Hypertension   . Acid reflux     Past Surgical History  Procedure Date  . Joint replacement     right hip  . Joint replacement     left hip 2012     History  Substance Use Topics  . Smoking status: Never Smoker   . Smokeless tobacco: Not on file  . Alcohol Use: No     unable to assess      Review of Systems  Unable to perform ROS: Dementia    Allergies  Sulfa antibiotics  Home Medications   Current Outpatient Rx  Name Route Sig Dispense Refill  . ASPIRIN 325 MG PO TABS Oral Take 325 mg by mouth daily.      . OS-CAL PO Oral Take 500 mg by mouth daily.      Marland Kitchen CALCIUM CARBONATE ANTACID 500 MG PO CHEW Oral Chew 1 tablet by mouth 2 (two) times daily.      Marland Kitchen DILTIAZEM HCL ER COATED BEADS 180 MG PO CP24 Oral Take 180 mg by mouth daily.     Marland Kitchen DILTIAZEM HCL ER COATED BEADS 180 MG PO CP24 Oral Take 360 mg by mouth daily.      Marland Kitchen DIPHENHYDRAMINE HCL 25 MG PO TABS Oral Take 25 mg by mouth every 6 (six) hours as needed.     Marland Kitchen DOCUSATE SODIUM 100 MG PO CAPS Oral Take 100 mg by mouth 2 (two) times daily.      Marland Kitchen ESOMEPRAZOLE MAGNESIUM 40 MG PO CPDR Oral Take 40 mg by  mouth daily before breakfast.      . GLIPIZIDE ER 2.5 MG PO TB24 Oral Take 2.5 mg by mouth daily.      . GUAIFENESIN ER 600 MG PO TB12 Oral Take 1,200 mg by mouth 2 (two) times daily.     . GUAIFENESIN ER 600 MG PO TB12 Oral Take 600 mg by mouth 2 (two) times daily.      Marland Kitchen HYDROCODONE-ACETAMINOPHEN 5-500 MG PO TABS Oral Take 1 tablet by mouth every 4 (four) hours as needed.      . IPRATROPIUM-ALBUTEROL 0.5-2.5 (3) MG/3ML IN SOLN Nebulization Take 3 mLs by nebulization every 6 (six) hours as needed. SOB/Wheezing     . LISINOPRIL 5 MG PO TABS Oral Take 5 mg by mouth daily.      Marland Kitchen MEMANTINE HCL 10 MG PO TABS Oral Take 10 mg by mouth 2 (two) times daily.      Marland Kitchen  METFORMIN HCL 500 MG PO TABS Oral Take 500 mg by mouth daily with breakfast.      . METOPROLOL TARTRATE 25 MG PO TABS Oral Take 12.5 mg by mouth 2 (two) times daily.      Marland Kitchen PROPAFENONE HCL 225 MG PO TABS Oral Take 225 mg by mouth 2 (two) times daily.      Marland Kitchen RANITIDINE HCL 150 MG PO TABS Oral Take 150 mg by mouth 2 (two) times daily.      . SENNOSIDES 8.6 MG PO TABS Oral Take 1 tablet by mouth daily.     . SENNOSIDES 8.6 MG PO TABS Oral Take 1 tablet by mouth 2 (two) times daily.        BP 119/39  Pulse 45  SpO2 100%  Physical Exam 1915: Physical examination:  Nursing notes reviewed; Vital signs and O2 SAT reviewed;  Constitutional: Well developed, Well nourished, In no acute distress; Head:  Normocephalic, atraumatic; Eyes: EOMI, PERRL, No scleral icterus; ENMT: Mouth and pharynx normal, Mucous membranes dry; Neck: Supple, Full range of motion, No lymphadenopathy; Cardiovascular: Bradycardic rate and rhythm, No gallop; Respiratory: Breath sounds clear & equal bilaterally, No wheezes. Normal respiratory effort/excursion; Chest: Nontender, Movement normal; Abdomen: Soft, Nontender, Nondistended, Normal bowel sounds;; Extremities: Pulses normal, No tenderness, No edema, No calf edema or asymmetry.; Neuro: Lethargic, but opens her eyes to her  name. Answers questions with nodding head and short answers. Major CN grossly intact.  Speech clear. No facial droop. Moves all ext on stretcher without apparent gross focal motor deficits.; Skin: Color normal, Warm, Dry.   ED Course  Procedures   1920:  HR 30's on arrival, sinus bradycardia on monitor.  SBP 130's.  IV atropine 1mg  given with improvement in HR to 50's, NSR on monitor.  Family at bedside states pt has hx of same "when she gets dehydrated."  Work up ordered, will continue to monitor.  2000:  Pt sitting up on stretcher, awake/alert, NAD, resps easy, talking with family at bedside.    2100:  SBP stable 110-120's, HR sinus bradycardia in 40's.  Potassium elevated, will tx with IV calcium, D50, insulin and albuterol neb.  BUN/Cr elevated from baseline, appears dehydrated; gentle IVF continues.   Udip appears contaminated, UC is pending; will not treat at this time as pt is afebrile with normal WBC count and lactic acid.  Dx and testing d/w pt and family.  Questions answered.  Verb understanding, agreeable to admit.  T/C to [facility] Orvan Falconer, case discussed, including:  HPI, pertinent PM/SHx, VS/PE, dx testing, ED course and treatment:  Agreeable to admit, requests to obtain stepdown bed.    2130: Meds above given with improvement in pt's HR to 90-100's, NSR, SBP 120's.  Pt continues to sit up on stretcher, awake/alert, resps easy, NAD.     MDM  MDM Reviewed: previous chart, nursing note and vitals Reviewed previous: labs Interpretation: ECG, labs and x-ray Total time providing critical care: 30-74 minutes. This excludes time spent performing separately reportable procedures and services. Consults: admitting MD     CRITICAL CARE Performed by: Laray Anger Total critical care time: 70 Critical care time was exclusive of separately billable procedures and treating other patients. Critical care was necessary to treat or prevent imminent or life-threatening  deterioration. Critical care was time spent personally by me on the following activities: development of treatment plan with patient and/or surrogate as well as nursing, discussions with consultants, evaluation of patient's response to treatment, examination of  patient, obtaining history from patient or surrogate, ordering and performing treatments and interventions, ordering and review of laboratory studies, ordering and review of radiographic studies, pulse oximetry and re-evaluation of patient's condition.    Date: 03/27/2012  Rate: 30  Rhythm: sinus bradycardia  QRS Axis: normal  Intervals: PR prolonged  ST/T Wave abnormalities: normal  Conduction Disutrbances:first-degree A-V block   Narrative Interpretation:   Old EKG Reviewed: none available.  Results for orders placed during the hospital encounter of 03/27/12  BASIC METABOLIC PANEL      Component Value Range   Sodium 134 (*) 135 - 145 mEq/L   Potassium 5.8 (*) 3.5 - 5.1 mEq/L   Chloride 103  96 - 112 mEq/L   CO2 23  19 - 32 mEq/L   Glucose, Bld 176 (*) 70 - 99 mg/dL   BUN 41 (*) 6 - 23 mg/dL   Creatinine, Ser 0.98 (*) 0.50 - 1.10 mg/dL   Calcium 8.9  8.4 - 11.9 mg/dL   GFR calc non Af Amer 22 (*) >90 mL/min   GFR calc Af Amer 26 (*) >90 mL/min  CBC WITH DIFFERENTIAL      Component Value Range   WBC 9.6  4.0 - 10.5 K/uL   RBC 3.76 (*) 3.87 - 5.11 MIL/uL   Hemoglobin 11.5 (*) 12.0 - 15.0 g/dL   HCT 14.7 (*) 82.9 - 56.2 %   MCV 92.6  78.0 - 100.0 fL   MCH 30.6  26.0 - 34.0 pg   MCHC 33.0  30.0 - 36.0 g/dL   RDW 13.0  86.5 - 78.4 %   Platelets 284  150 - 400 K/uL   Neutrophils Relative 57  43 - 77 %   Neutro Abs 5.5  1.7 - 7.7 K/uL   Lymphocytes Relative 29  12 - 46 %   Lymphs Abs 2.8  0.7 - 4.0 K/uL   Monocytes Relative 7  3 - 12 %   Monocytes Absolute 0.7  0.1 - 1.0 K/uL   Eosinophils Relative 6 (*) 0 - 5 %   Eosinophils Absolute 0.6  0.0 - 0.7 K/uL   Basophils Relative 1  0 - 1 %   Basophils Absolute 0.1  0.0 -  0.1 K/uL  LACTIC ACID, PLASMA      Component Value Range   Lactic Acid, Venous 1.3  0.5 - 2.2 mmol/L  TROPONIN I      Component Value Range   Troponin I <0.30  <0.30 ng/mL  URINALYSIS, ROUTINE W REFLEX MICROSCOPIC      Component Value Range   Color, Urine YELLOW  YELLOW   APPearance CLOUDY (*) CLEAR   Specific Gravity, Urine >1.030 (*) 1.005 - 1.030   pH 5.5  5.0 - 8.0   Glucose, UA NEGATIVE  NEGATIVE mg/dL   Hgb urine dipstick TRACE (*) NEGATIVE   Bilirubin Urine SMALL (*) NEGATIVE   Ketones, ur 15 (*) NEGATIVE mg/dL   Protein, ur 30 (*) NEGATIVE mg/dL   Urobilinogen, UA 0.2  0.0 - 1.0 mg/dL   Nitrite NEGATIVE  NEGATIVE   Leukocytes, UA MODERATE (*) NEGATIVE  URINE MICROSCOPIC-ADD ON      Component Value Range   Squamous Epithelial / LPF MANY (*) RARE   WBC, UA TOO NUMEROUS TO COUNT  <3 WBC/hpf   RBC / HPF 11-20  <3 RBC/hpf   Bacteria, UA MANY (*) RARE  MAGNESIUM      Component Value Range   Magnesium 2.0  1.5 - 2.5  mg/dL   Dg Chest Port 1 View 03/27/2012  *RADIOLOGY REPORT*  Clinical Data: Bradycardia  PORTABLE CHEST - 1 VIEW  Comparison: 10/22/2011  Findings: COPD.  Negative for heart failure.  Negative for pneumonia or effusion.  Lungs are clear.  IMPRESSION: No acute cardiopulmonary disease.   Original Report Authenticated By: Camelia Phenes, M.D.     Results for MEESHA, SEK (MRN [id number]) as of 03/27/2012 21:31  Ref. Range 01/26/2011 05:50 01/27/2011 05:59 01/28/2011 05:05 01/29/2011 06:52 03/27/2012 19:46  BUN Latest Range: 6-23 mg/dL 10 12 13 10  41 (H)  Creatinine Latest Range: 0.50-1.10 mg/dL 4.09 8.11 9.14 7.82 9.56 (H)         Laray Anger, DO 03/29/12 1546

## 2012-03-28 DIAGNOSIS — F068 Other specified mental disorders due to known physiological condition: Secondary | ICD-10-CM

## 2012-03-28 DIAGNOSIS — I10 Essential (primary) hypertension: Secondary | ICD-10-CM | POA: Diagnosis present

## 2012-03-28 DIAGNOSIS — F0390 Unspecified dementia without behavioral disturbance: Secondary | ICD-10-CM | POA: Diagnosis present

## 2012-03-28 DIAGNOSIS — K219 Gastro-esophageal reflux disease without esophagitis: Secondary | ICD-10-CM | POA: Diagnosis present

## 2012-03-28 DIAGNOSIS — E860 Dehydration: Secondary | ICD-10-CM

## 2012-03-28 DIAGNOSIS — R5381 Other malaise: Secondary | ICD-10-CM | POA: Diagnosis present

## 2012-03-28 DIAGNOSIS — R9431 Abnormal electrocardiogram [ECG] [EKG]: Secondary | ICD-10-CM | POA: Diagnosis present

## 2012-03-28 LAB — CK TOTAL AND CKMB (NOT AT ARMC)
CK, MB: 2.3 ng/mL (ref 0.3–4.0)
Relative Index: INVALID (ref 0.0–2.5)
Total CK: 42 U/L (ref 7–177)

## 2012-03-28 LAB — BASIC METABOLIC PANEL
BUN: 32 mg/dL — ABNORMAL HIGH (ref 6–23)
CO2: 21 mEq/L (ref 19–32)
Calcium: 9.5 mg/dL (ref 8.4–10.5)
Creatinine, Ser: 1.43 mg/dL — ABNORMAL HIGH (ref 0.50–1.10)

## 2012-03-28 LAB — CBC
MCH: 31.1 pg (ref 26.0–34.0)
MCHC: 33.9 g/dL (ref 30.0–36.0)
MCV: 91.8 fL (ref 78.0–100.0)
Platelets: 234 10*3/uL (ref 150–400)
RDW: 13.1 % (ref 11.5–15.5)
WBC: 9.3 10*3/uL (ref 4.0–10.5)

## 2012-03-28 LAB — URINALYSIS, ROUTINE W REFLEX MICROSCOPIC
Ketones, ur: NEGATIVE mg/dL
Nitrite: NEGATIVE
Protein, ur: NEGATIVE mg/dL
Urobilinogen, UA: 0.2 mg/dL (ref 0.0–1.0)

## 2012-03-28 LAB — TROPONIN I: Troponin I: <0.3 ng/mL (ref <0.30)

## 2012-03-28 LAB — URINE MICROSCOPIC-ADD ON

## 2012-03-28 MED ORDER — LEVALBUTEROL HCL 0.63 MG/3ML IN NEBU
0.6300 mg | INHALATION_SOLUTION | Freq: Four times a day (QID) | RESPIRATORY_TRACT | Status: DC | PRN
  Administered 2012-03-28 – 2012-03-29 (×2): 0.63 mg via RESPIRATORY_TRACT
  Filled 2012-03-28 (×2): qty 3

## 2012-03-28 MED ORDER — ENOXAPARIN SODIUM 40 MG/0.4ML ~~LOC~~ SOLN
40.0000 mg | SUBCUTANEOUS | Status: DC
  Administered 2012-03-28 – 2012-03-29 (×2): 40 mg via SUBCUTANEOUS
  Filled 2012-03-28 (×2): qty 0.4

## 2012-03-28 MED ORDER — HYDRALAZINE HCL 20 MG/ML IJ SOLN
10.0000 mg | INTRAMUSCULAR | Status: DC | PRN
  Administered 2012-03-28: 10 mg via INTRAVENOUS
  Filled 2012-03-28: qty 1

## 2012-03-28 MED ORDER — CHLORHEXIDINE GLUCONATE CLOTH 2 % EX PADS
6.0000 | MEDICATED_PAD | Freq: Every day | CUTANEOUS | Status: DC
  Administered 2012-03-28 – 2012-03-30 (×3): 6 via TOPICAL

## 2012-03-28 MED ORDER — ENSURE PUDDING PO PUDG
1.0000 | Freq: Three times a day (TID) | ORAL | Status: DC
  Administered 2012-03-28 – 2012-03-29 (×4): 1 via ORAL

## 2012-03-28 MED ORDER — MEMANTINE HCL 10 MG PO TABS
10.0000 mg | ORAL_TABLET | Freq: Two times a day (BID) | ORAL | Status: DC
  Administered 2012-03-28 – 2012-03-30 (×5): 10 mg via ORAL
  Filled 2012-03-28 (×5): qty 1

## 2012-03-28 MED ORDER — MUPIROCIN 2 % EX OINT
1.0000 "application " | TOPICAL_OINTMENT | Freq: Two times a day (BID) | CUTANEOUS | Status: DC
  Administered 2012-03-28 – 2012-03-30 (×5): 1 via NASAL
  Filled 2012-03-28 (×3): qty 22

## 2012-03-28 NOTE — Progress Notes (Signed)
Chart reviewed, current and old.  Subjective: No complaints. Per RN, no new issues.  Objective: Vital signs in last 24 hours: Filed Vitals:   03/28/12 0600 03/28/12 0603 03/28/12 0700 03/28/12 0800  BP: 186/172 169/58 110/54 90/69  Pulse:      Temp:    97.4 F (36.3 C)  TempSrc:    Oral  Resp: 20  24 25   Height:      Weight:      SpO2:       Weight change:   Intake/Output Summary (Last 24 hours) at 03/28/12 0905 Last data filed at 03/28/12 0600  Gross per 24 hour  Intake 931.25 ml  Output    750 ml  Net 181.25 ml   telemetry: Normal sinus rhythm. Heart rate 80.  Gen:  Asleep. Arousable. Confused. HEENT: Dry mucous membranes Abdomen soft nontender nondistended Lungs clear to auscultation bilaterally without wheezes rhonchi or rales Cardiovascular regular rate rhythm without murmurs gallops rubs  Lab Results: Basic Metabolic Panel:  Lab 03/28/12 4540 03/27/12 1946  NA 135 134*  K 4.8 5.8*  CL 106 103  CO2 21 23  GLUCOSE 110* 176*  BUN 32* 41*  CREATININE 1.43* 1.95*  CALCIUM 9.5 8.9  MG -- 2.0  PHOS -- --   Liver Function Tests:  Lab 03/27/12 1946  AST 9  ALT 9  ALKPHOS 116  BILITOT 0.1*  PROT 6.7  ALBUMIN 3.0*   No results found for this basename: LIPASE:2,AMYLASE:2 in the last 168 hours No results found for this basename: AMMONIA:2 in the last 168 hours CBC:  Lab 03/28/12 0442 03/27/12 1946  WBC 9.3 9.6  NEUTROABS -- 5.5  HGB 10.2* 11.5*  HCT 30.1* 34.8*  MCV 91.8 92.6  PLT 234 284   Cardiac Enzymes:  Lab 03/27/12 1946  CKTOTAL --  CKMB --  CKMBINDEX --  TROPONINI <0.30   BNP: No results found for this basename: PROBNP:3 in the last 168 hours D-Dimer: No results found for this basename: DDIMER:2 in the last 168 hours CBG:  Lab 03/27/12 1834 03/27/12 0503 03/26/12 0504 03/25/12 0437 03/24/12 0616 03/23/12 0538  GLUCAP 184* 137* 118* 137* 173* 158*   Hemoglobin A1C: No results found for this basename: HGBA1C in the last 168  hours Fasting Lipid Panel: No results found for this basename: CHOL,HDL,LDLCALC,TRIG,CHOLHDL,LDLDIRECT in the last 981 hours Thyroid Function Tests: No results found for this basename: TSH,T4TOTAL,FREET4,T3FREE,THYROIDAB in the last 168 hours Coagulation: No results found for this basename: LABPROT:4,INR:4 in the last 168 hours Anemia Panel: No results found for this basename: VITAMINB12,FOLATE,FERRITIN,TIBC,IRON,RETICCTPCT in the last 168 hours Urine Drug Screen: Drugs of Abuse  No results found for this basename: labopia, cocainscrnur, labbenz, amphetmu, thcu, labbarb    Alcohol Level: No results found for this basename: ETH:2 in the last 168 hours Urinalysis:  Lab 03/27/12 1955  COLORURINE YELLOW  LABSPEC >1.030*  PHURINE 5.5  GLUCOSEU NEGATIVE  HGBUR TRACE*  BILIRUBINUR SMALL*  KETONESUR 15*  PROTEINUR 30*  UROBILINOGEN 0.2  NITRITE NEGATIVE  LEUKOCYTESUR MODERATE*   many squamous epithelial cells. Too numerous to count white cells. Many bacteria.  Micro Results: Recent Results (from the past 240 hour(s))  MRSA PCR SCREENING     Status: Abnormal   Collection Time   03/27/12 10:33 PM      Component Value Range Status Comment   MRSA by PCR POSITIVE (*) NEGATIVE Final    Studies/Results: Dg Chest Port 1 View  03/27/2012  *RADIOLOGY REPORT*  Clinical Data:  Bradycardia  PORTABLE CHEST - 1 VIEW  Comparison: 10/22/2011  Findings: COPD.  Negative for heart failure.  Negative for pneumonia or effusion.  Lungs are clear.  IMPRESSION: No acute cardiopulmonary disease.   Original Report Authenticated By: Camelia Phenes, M.D.    EKG from this morning shows normal sinus rhythm with first degree AV block. ST depression most prominent in lead V3. Also present to a lesser extent inferolaterally. This is new from previous EKGs.  Scheduled Meds:   . aspirin  325 mg Oral Daily  . atropine      . calcium carbonate  1 tablet Oral BID  . calcium chloride  1 g Intravenous Once  .  cefTRIAXone (ROCEPHIN)  IV  1 g Intravenous Q24H  . Chlorhexidine Gluconate Cloth  6 each Topical Q0600  . dextrose      . diphenhydrAMINE  12.5 mg Intravenous Once  . docusate sodium  100 mg Oral BID  . enoxaparin (LOVENOX) injection  30 mg Subcutaneous Q24H  . famotidine  20 mg Oral BID  . mupirocin ointment  1 application Nasal BID  . propafenone  225 mg Oral BID  . sodium chloride  3 mL Intravenous Q12H  . DISCONTD: albuterol  10 mg Nebulization Once  . DISCONTD: dextrose  50 mL Intravenous Once  . DISCONTD: dextrose  1 ampule Intravenous Once  . DISCONTD: insulin aspart  10 Units Intravenous Once  . DISCONTD: insulin regular  10 Units Intravenous Once  . DISCONTD: levalbuterol  0.63 mg Nebulization Q6H   Continuous Infusions:   . sodium chloride 125 mL/hr at 03/28/12 0805  . DISCONTD: sodium chloride 75 mL/hr at 03/27/12 1932   PRN Meds:.acetaminophen, diphenhydrAMINE, hydrALAZINE, HYDROmorphone (DILAUDID) injection, levalbuterol, LORazepam, ondansetron (ZOFRAN) IV, ondansetron, oxyCODONE, senna, sodium phosphate, traZODone, DISCONTD: senna Assessment/Plan: Active Problems:  Dehydration: Continue IV fluid.  UTI (lower urinary tract infection) urinalysis shows many squamous epithelial cells, but clinical picture could be consistent with UTI and toxic encephalopathy. Continue antibiotics. Repeat UA. Await culture results.  Abnormal EKG: Will cycle cardiac enzymes.  Continue aspirin. Repeat EKG. No reported chest pain, but patient has severe dementia. Hold off on beta blockers do to borderline hypotension and earlier bradycardia. There is a cardiology consult note in the chart that mentions previous coronary artery stenting.  Diabetes mellitus type 2 in nonobese: Will give sliding scale. Paroxysmal a-fib: Currently in sinus rhythm.  Acute renal failure secondary to dehydration, improving.  Metabolic encephalopathy: According to nursing staff, daughter reports that patient is  currently at baseline. Daughter is not currently available  Organic brain syndrome  Pruritic dermatitis  Hyperkalemia resolved  Bradycardia resolved  Advanced dementia  Benign hypertension: Medications held.  GERD (gastroesophageal reflux disease)  Debility   LOS: 1 day   Melford Tullier L 03/28/2012, 9:05 AM

## 2012-03-29 LAB — URINE CULTURE: Colony Count: >=100000

## 2012-03-29 LAB — CBC
HCT: 29.3 % — ABNORMAL LOW (ref 36.0–46.0)
Hemoglobin: 9.7 g/dL — ABNORMAL LOW (ref 12.0–15.0)
MCH: 30.8 pg (ref 26.0–34.0)
MCV: 93 fL (ref 78.0–100.0)
RBC: 3.15 MIL/uL — ABNORMAL LOW (ref 3.87–5.11)

## 2012-03-29 LAB — BASIC METABOLIC PANEL
CO2: 21 mEq/L (ref 19–32)
Calcium: 8.7 mg/dL (ref 8.4–10.5)
Glucose, Bld: 90 mg/dL (ref 70–99)
Sodium: 138 mEq/L (ref 135–145)

## 2012-03-29 MED ORDER — ENSURE PUDDING PO PUDG
1.0000 | Freq: Four times a day (QID) | ORAL | Status: DC
  Administered 2012-03-29 – 2012-03-30 (×4): 1 via ORAL

## 2012-03-29 MED ORDER — SODIUM CHLORIDE 0.9 % IJ SOLN
INTRAMUSCULAR | Status: AC
  Filled 2012-03-29: qty 10

## 2012-03-29 MED ORDER — GLIPIZIDE ER 2.5 MG PO TB24
2.5000 mg | ORAL_TABLET | Freq: Every day | ORAL | Status: DC
  Administered 2012-03-29 – 2012-03-30 (×2): 2.5 mg via ORAL
  Filled 2012-03-29 (×3): qty 1

## 2012-03-29 NOTE — Progress Notes (Signed)
Subjective: No complaints. Per RN, eats the meals that the daughter brings. Does not eat much of the hospital tray, but takes ensure well.  Objective: Vital signs in last 24 hours: Filed Vitals:   03/29/12 0300 03/29/12 0400 03/29/12 0500 03/29/12 0600  BP:   116/43   Pulse: 83 86 89 81  Temp:  98 F (36.7 C)    TempSrc:  Axillary    Resp: 20 21 21 18   Height:      Weight:   78.4 kg (172 lb 13.5 oz)   SpO2: 97% 98% 97% 96%   Weight change: 2 kg (4 lb 6.6 oz)  Intake/Output Summary (Last 24 hours) at 03/29/12 1034 Last data filed at 03/29/12 0936  Gross per 24 hour  Intake   2553 ml  Output   2050 ml  Net    503 ml   telemetry: Normal sinus rhythm. Heart rate 80.  Gen:  Asleep. Arousable. Confused. HEENT: Dry mucous membranes Abdomen soft nontender nondistended Lungs clear to auscultation bilaterally without wheezes rhonchi or rales Cardiovascular regular rate rhythm without murmurs gallops rubs  Lab Results: Basic Metabolic Panel:  Lab 03/29/12 4098 03/28/12 0442 03/27/12 1946  NA 138 135 --  K 4.6 4.8 --  CL 110 106 --  CO2 21 21 --  GLUCOSE 90 110* --  BUN 21 32* --  CREATININE 1.18* 1.43* --  CALCIUM 8.7 9.5 --  MG -- -- 2.0  PHOS -- -- --   Liver Function Tests:  Lab 03/27/12 1946  AST 9  ALT 9  ALKPHOS 116  BILITOT 0.1*  PROT 6.7  ALBUMIN 3.0*   No results found for this basename: LIPASE:2,AMYLASE:2 in the last 168 hours No results found for this basename: AMMONIA:2 in the last 168 hours CBC:  Lab 03/29/12 0446 03/28/12 0442 03/27/12 1946  WBC 6.9 9.3 --  NEUTROABS -- -- 5.5  HGB 9.7* 10.2* --  HCT 29.3* 30.1* --  MCV 93.0 91.8 --  PLT 211 234 --   Cardiac Enzymes:  Lab 03/28/12 2106 03/28/12 1550 03/28/12 0912  CKTOTAL -- -- 42  CKMB -- -- 2.3  CKMBINDEX -- -- --  TROPONINI <0.30 <0.30 <0.30   BNP: No results found for this basename: PROBNP:3 in the last 168 hours D-Dimer: No results found for this basename: DDIMER:2 in the  last 168 hours CBG:  Lab 03/27/12 1834 03/27/12 0503 03/26/12 0504 03/25/12 0437 03/24/12 0616 03/23/12 0538  GLUCAP 184* 137* 118* 137* 173* 158*   Hemoglobin A1C: No results found for this basename: HGBA1C in the last 168 hours Fasting Lipid Panel: No results found for this basename: CHOL,HDL,LDLCALC,TRIG,CHOLHDL,LDLDIRECT in the last 119 hours Thyroid Function Tests:  Lab 03/28/12 0912  TSH 1.489  T4TOTAL --  FREET4 --  T3FREE --  THYROIDAB --   Coagulation: No results found for this basename: LABPROT:4,INR:4 in the last 168 hours Anemia Panel: No results found for this basename: VITAMINB12,FOLATE,FERRITIN,TIBC,IRON,RETICCTPCT in the last 168 hours Urine Drug Screen: Drugs of Abuse  No results found for this basename: labopia,  cocainscrnur,  labbenz,  amphetmu,  thcu,  labbarb    Alcohol Level: No results found for this basename: ETH:2 in the last 168 hours Urinalysis:  Lab 03/28/12 1337 03/27/12 1955  COLORURINE YELLOW YELLOW  LABSPEC 1.025 >1.030*  PHURINE 5.5 5.5  GLUCOSEU NEGATIVE NEGATIVE  HGBUR TRACE* TRACE*  BILIRUBINUR NEGATIVE SMALL*  KETONESUR NEGATIVE 15*  PROTEINUR NEGATIVE 30*  UROBILINOGEN 0.2 0.2  NITRITE NEGATIVE  NEGATIVE  LEUKOCYTESUR SMALL* MODERATE*   many squamous epithelial cells. Too numerous to count white cells. Many bacteria.  Micro Results: Recent Results (from the past 240 hour(s))  MRSA PCR SCREENING     Status: Abnormal   Collection Time   03/27/12 10:33 PM      Component Value Range Status Comment   MRSA by PCR POSITIVE (*) NEGATIVE Final    Studies/Results: Dg Chest Port 1 View  03/27/2012  *RADIOLOGY REPORT*  Clinical Data: Bradycardia  PORTABLE CHEST - 1 VIEW  Comparison: 10/22/2011  Findings: COPD.  Negative for heart failure.  Negative for pneumonia or effusion.  Lungs are clear.  IMPRESSION: No acute cardiopulmonary disease.   Original Report Authenticated By: Camelia Phenes, M.D.    EKG from this morning shows normal  sinus rhythm with first degree AV block. ST depression most prominent in lead V3. Also present to a lesser extent inferolaterally. This is new from previous EKGs.  Repeat EKG yesterday at 11 AM shows slightly improved ST depression.  Scheduled Meds:    . aspirin  325 mg Oral Daily  . calcium carbonate  1 tablet Oral BID  . cefTRIAXone (ROCEPHIN)  IV  1 g Intravenous Q24H  . Chlorhexidine Gluconate Cloth  6 each Topical Q0600  . docusate sodium  100 mg Oral BID  . enoxaparin (LOVENOX) injection  40 mg Subcutaneous Q24H  . famotidine  20 mg Oral BID  . feeding supplement  1 Container Oral TID BM  . memantine  10 mg Oral BID  . mupirocin ointment  1 application Nasal BID  . propafenone  225 mg Oral BID  . sodium chloride  3 mL Intravenous Q12H  . DISCONTD: enoxaparin (LOVENOX) injection  30 mg Subcutaneous Q24H   Continuous Infusions:    . sodium chloride 125 mL/hr at 03/29/12 0600   PRN Meds:.acetaminophen, diphenhydrAMINE, hydrALAZINE, HYDROmorphone (DILAUDID) injection, levalbuterol, LORazepam, ondansetron (ZOFRAN) IV, ondansetron, oxyCODONE, senna, traZODone Assessment/Plan: Active Problems:  Dehydration: Continue IV fluid. BUN and creatinine are improving, but clinically still appears dehydrated. Seems to be taking Ensure more reliably than her meal trays. Will change ensure to 4 times a day.  UTI (lower urinary tract infection) urinalysis shows many squamous epithelial cells, but clinical picture could be consistent with UTI and toxic encephalopathy. Continue antibiotics. Repeat UA unfortunately again shows many squamous epithelial cells. Await culture results.  Abnormal EKG: MI ruled out. Likely strain from acute illness. Repeat EKG improved Continue aspirin. No reported chest pain, but patient has severe dementia. Hold off on beta blockers due to borderline hypotension and earlier bradycardia.   Diabetes mellitus type 2 in nonobese: Will give sliding scale. Resume  glipizide Paroxysmal a-fib: Currently in sinus rhythm.  Acute renal failure secondary to dehydration, resolving.  Metabolic encephalopathy: daughter reports that patient is currently at baseline.  Organic brain syndrome  Pruritic dermatitis  Hyperkalemia resolved  Bradycardia resolved  Advanced dementia  Benign hypertension: Medications held.  GERD (gastroesophageal reflux disease)  Debility Updated daughter. Transferred to telemetry yesterday afternoon. Not yet ready for discharge, but hopefully tomorrow.   LOS: 2 days   [name] L 03/29/2012, 10:34 AM

## 2012-03-29 NOTE — Clinical Social Work Psychosocial (Signed)
    Clinical Social Work Department BRIEF PSYCHOSOCIAL ASSESSMENT 03/29/2012  Patient:  [name], [name]     Account Number:  [id number]     Admit date:  03/27/2012  Clinical Social Worker:  Santa Genera, CLINICAL SOCIAL WORKER  Date/Time:  03/29/2012 12:30 PM  Referred by:  RN  Date Referred:  03/29/2012 Referred for  Other - See comment   Other Referral:   ALF placement at [facility]   Interview type:  Family Other interview type:    PSYCHOSOCIAL DATA Living Status:  FACILITY Admitted from facility:  [facility] Level of care:  Assisted Living Primary support name:  Venetia Night Primary support relationship to patient:  CHILD, ADULT Degree of support available:   Significant    CURRENT CONCERNS  Other Concerns:    SOCIAL WORK ASSESSMENT / PLAN CSW unable to assess patient due to dementia.  CSW spoke w [facility], facility willing to take patient back at discharge, let them know possibly tomorrow.  CSW spoke w daughter by phone.  Daughter very concerned about care mother is getting at [facility].  PNC has patient at ALF level, not monitoring patient fluid intake per daughter.  Patient needs prompts to drink, unable to remember independently. Patient also bed bound due to 2 hip fractures, daughter states that fluid is not always placed on bed tray but across room.  Daughter reports that she wants facility to place great emphasis on getting mother to drink.  Leaves Gatorade in fridge for [facility] to use to hydrate, wants facility to monitor dehydration.  Willing to pay PCP (Dr. Deniece Portela) to assess hydration status weekly if Medicare will not pay.  Daughter has called administration at [facility] to voice her complaints, says this is third and more serious time this has happened.  Each time, daughter notes that facility makes plan to address hydration, with success for period of time, but as staff changes, mother's needs are not met.  Daughter wants mother to remain in ALF at [facility], but wants facility to take care  of hydration issues.  CSW allowed daughter to express concerns and encouraged resolution of issue w facliity, will communicate concerns to Admissions at [facility].   Assessment/plan status:  Psychosocial Support/Ongoing Assessment of Needs Other assessment/ plan:   Information/referral to community resources:   None needed at this time.    PATIENT'S/FAMILY'S RESPONSE TO PLAN OF CARE: Daughter appreciative of opportunity to express concerns, will keep CSW advised of status of communication w [facility].     Santa Genera, LCSW Clinical Social Worker [phone])

## 2012-03-29 NOTE — Progress Notes (Signed)
Attempted to get patient up to chair with steady. Patient unable to assist at all. Attempt to get patient up failed. Patient now back in bed. Bed is in chair position. Will attempt to get patient up to chair with lift later today.

## 2012-03-30 ENCOUNTER — Inpatient Hospital Stay
Admission: RE | Admit: 2012-03-30 | Discharge: 2012-10-22 | Disposition: A | Discharge status: Other Acute Inpatient Hospital | Payer: Medicare HMO | Source: Ambulatory Visit | Attending: Internal Medicine | Admitting: Internal Medicine

## 2012-03-30 DIAGNOSIS — R05 Cough: Principal | ICD-10-CM

## 2012-03-30 DIAGNOSIS — R0989 Other specified symptoms and signs involving the circulatory and respiratory systems: Secondary | ICD-10-CM

## 2012-03-30 LAB — BASIC METABOLIC PANEL
BUN: 12 mg/dL (ref 6–23)
Chloride: 107 mEq/L (ref 96–112)
GFR calc non Af Amer: 48 mL/min — ABNORMAL LOW (ref >90)
Glucose, Bld: 92 mg/dL (ref 70–99)
Potassium: 4.1 mEq/L (ref 3.5–5.1)

## 2012-03-30 LAB — CBC
HCT: 27.6 % — ABNORMAL LOW (ref 36.0–46.0)
Hemoglobin: 9.3 g/dL — ABNORMAL LOW (ref 12.0–15.0)
MCH: 30.9 pg (ref 26.0–34.0)
MCHC: 33.7 g/dL (ref 30.0–36.0)

## 2012-03-30 MED ORDER — ENSURE PUDDING PO PUDG: 1.0000 | Freq: Four times a day (QID) | ORAL | Status: DC

## 2012-03-30 MED ORDER — METOPROLOL TARTRATE 50 MG PO TABS
50.0000 mg | ORAL_TABLET | Freq: Two times a day (BID) | ORAL | Status: DC
  Administered 2012-03-30: 50 mg via ORAL
  Filled 2012-03-30: qty 1

## 2012-03-30 MED ORDER — CEFUROXIME AXETIL 500 MG PO TABS: 500.0000 mg | ORAL_TABLET | Freq: Two times a day (BID) | ORAL | Status: DC

## 2012-03-30 NOTE — Clinical Social Work Note (Signed)
Patient ready for discharge, discharge summary faxed to [facility] via [facility].  Facility and family informed of discharge and agreeable.  Daughter's concerns about hydration being addressed by facility w plan discussed w daughter yesterday.  Discharge packet prepared and placed for transport to facility via tunnel.  Santa Genera, LCSW Clinical Social Worker [phone])

## 2012-03-30 NOTE — Progress Notes (Signed)
Report given to Tiffany, Charity fundraiser. Patient being returned to [facility] Via bed. Patient sleeping and lethargic but arrousable at time of transfer. Patients VS remain stable.

## 2012-03-30 NOTE — Discharge Summary (Addendum)
Physician Discharge Summary  Patient ID: IVEY NEMBHARD MRN: [id number] DOB/AGE: 1924/09/12 78 y.o.  Admit date: 03/27/2012 Discharge date: 03/30/2012  Discharge Diagnoses:  Active Problems:  Dehydration  UTI (lower urinary tract infection)  Abnormal EKG  Diabetes mellitus type 2 in nonobese  Chronic a-fib  Acute renal failure  Metabolic encephalopathy  Pruritic dermatitis  Hyperkalemia  Bradycardia  Advanced dementia  Benign hypertension  GERD (gastroesophageal reflux disease)  Debility     Medication List     As of 03/30/2012  8:17 AM    STOP taking these medications         diltiazem 180 MG 24 hr capsule   Commonly known as: CARDIZEM CD      lisinopril 30 MG tablet   Commonly known as: PRINIVIL,ZESTRIL      TAKE these medications         acetaminophen 325 MG tablet   Commonly known as: TYLENOL   Take 650 mg by mouth every 4 (four) hours as needed. Pain.      aspirin 325 MG tablet   Take 325 mg by mouth daily.      calcium carbonate 500 MG chewable tablet   Commonly known as: TUMS - dosed in mg elemental calcium   Chew 1 tablet by mouth 2 (two) times daily.      cefUROXime 500 MG tablet   Commonly known as: CEFTIN   Take 1 tablet (500 mg total) by mouth 2 (two) times daily. For 4 more days      diphenhydrAMINE 25 MG tablet   Commonly known as: BENADRYL   Take 25 mg by mouth every 6 (six) hours as needed. Itching.      docusate sodium 100 MG capsule   Commonly known as: COLACE   Take 100 mg by mouth 2 (two) times daily.      DUONEB 0.5-2.5 (3) MG/3ML Soln   Generic drug: ipratropium-albuterol   Take 3 mLs by nebulization every 6 (six) hours as needed. SOB/Wheezing      feeding supplement Pudg   Take 1 Container by mouth 4 (four) times daily.      glipiZIDE 2.5 MG 24 hr tablet   Commonly known as: GLUCOTROL XL   Take 2.5 mg by mouth daily.      HYDROcodone-acetaminophen 5-500 MG per tablet   Commonly known as: VICODIN   Take 1 tablet by mouth  every 4 (four) hours as needed. Pain.      LORazepam 0.5 MG tablet   Commonly known as: ATIVAN   Take 0.5 mg by mouth every 8 (eight) hours as needed. Anxiety.      memantine 10 MG tablet   Commonly known as: NAMENDA   Take 10 mg by mouth 2 (two) times daily.      metoprolol 50 MG tablet   Commonly known as: LOPRESSOR   Take 50 mg by mouth 2 (two) times daily.      potassium chloride 10 MEQ tablet   Commonly known as: K-DUR,KLOR-CON   Take 10 mEq by mouth 2 (two) times daily.      propafenone 225 MG tablet   Commonly known as: RYTHMOL   Take 225 mg by mouth 2 (two) times daily.      ranitidine 150 MG tablet   Commonly known as: ZANTAC   Take 150 mg by mouth 2 (two) times daily. Heartburn.      senna 8.6 MG tablet   Commonly known as: SENOKOT   Take 1  tablet by mouth 2 (two) times daily as needed. Constipation.      Vitamin D-3 1000 UNITS Caps   Take 2 capsules by mouth daily.            Discharge Orders    Future Orders Please Complete By Expires   Diet Carb Modified      Discharge instructions      Comments:   Push fluid intake   Walk with assistance         Disposition: SNF  Discharged Condition: stable  Consults:  none  Labs:    Sodium        135 138 136    Potassium        4.8  4.6 4.1    Chloride        106 110 107    CO2        21 21 21     BUN        32 21  12    Creatinine, Ser        1.43 1.18 1.03    Calcium        9.5 8.7 8.7    GFR calc non Af Amer        32 41 48    GFR calc Af Amer        38  47  56     Glucose, Bld        110 90 92    Magnesium        2.0      Alkaline Phosphatase        116      Albumin        3.0      AST        9      ALT        9      Total Protein        6.7      Bilirubin, Direct        <0.1      Indirect Bilirubin        NOT CALCULATED      Total Bilirubin        0.1       CARDIAC PROFILE    CK, MB         2.3     Total CK         42     Troponin I        <0.30  <0.30       OTHER CHEM     Lactic Acid, Venous        1.3       CBC    WBC        9.3 6.9 6.0    RBC        3.28 3.15 3.01    Hemoglobin        10.2 9.7 9.3    HCT        30.1 29.3 27.6    MCV        91.8 93.0 91.7    MCH        31.1 30.8 30.9    MCHC        33.9 33.1 33.7    RDW        13.1 13.5 13.2    Platelets        234 211 202     DIFFERENTIAL    Neutrophils  Relative        57      Lymphocytes Relative        29      Monocytes Relative        7      Eosinophils Relative        6      Basophils Relative        1      Neutro Abs        5.5      Lymphs Abs        2.8      Monocytes Absolute        0.7      Eosinophils Absolute        0.6      Basophils Absolute        0.1       DIABETES    Glucose, Bld        110 90 92     THYROID    TSH         1.489      URINALYSIS    Color, Urine        YELLOW YELLOW     APPearance        CLOUDY CLEAR     Specific Gravity, Urine        1.030">1.030 1.025     pH        5.5 5.5     Glucose, UA        NEGATIVE NEGATIVE     Bilirubin Urine        SMALL NEGATIVE     Ketones, ur        15 NEGATIVE     Protein, ur        30 NEGATIVE     Urobilinogen, UA        0.2 0.2     Nitrite        NEGATIVE NEGATIVE     Leukocytes, UA        MODERATE SMALL     Hgb urine dipstick        TRACE TRACE     WBC, UA        TOO NUMEROUS TO COUNT 21-50     RBC / HPF        11-20 7-10     Squamous Epithelial / LPF        MANY MANY     Bacteria, UA        MANY FEW     Diagnostics:  Dg Chest Port 1 View  03/27/2012  *RADIOLOGY REPORT*  Clinical Data: Bradycardia  PORTABLE CHEST - 1 VIEW  Comparison: 10/22/2011  Findings: COPD.  Negative for heart failure.  Negative for pneumonia or effusion.  Lungs are clear.  IMPRESSION: No acute cardiopulmonary disease.   Original Report Authenticated By: Camelia Phenes, M.D.     Procedures: none EKG: Marked sinus bradycardia with sinus arrhythmia with 1st degree A-V block Non-specific intra-ventricular conduction delay Nonspecific ST  abnormality  Repeat showed NSR with new ST depression worst in V3 but inferolaterally as well.  Subsequent repeat EKG showed resolution of ST depression.  Full Code   Hospital Course: See H&P for complete admission details. The patient is an 78 year old white female from skilled nursing facility who is essentially bed bound and has advanced dementia. She had decreased level of consciousness and was sent to the emergency room. There, she was found to have a  heart rate in the 30s. She is on Cardizem metoprolol as an outpatient. She was unable to provide any history. She received atropine. Temperature and blood pressure were normal. She had dry mucous membranes. After fluid bolus, her mentation improved. Workup showed a potassium of 5.8. She also was on lisinopril. Her BUN was 41. Creatinine 1.95. White blood cell count normal. Urinalysis consistent with urinary tract infection. Chest x-ray showed no pneumonia.  She was started on antibiotics, IV fluids. Her antihypertensives were held. Her renal failure and electrolyte disturbances were corrected. Her blood pressure and heart rate had increased. MI was ruled out by serial troponins. I have started the metoprolol back today, but will hold Cardizem and lisinopril. These can be added back judiciously if blood pressure and heart rate permits, but would monitor her blood work and heart rate closely. Urine cultures grew out multiple bacterial morphotypes. She each intermittently, but takes ensure well. I recommend giving ensure between meals. I also recommend pushing fluids. I've spoken with family about disposition and diagnoses. She will go back to skilled nursing facility. According to her daughter, she is back to baseline mental status. She is euvolemic and medically stable.  Discharge Exam:  Blood pressure 172/60, pulse 93, temperature 98.1 F (36.7 C), temperature source Axillary, resp. rate 17, height 5\' 10"  (1.778 m), weight 78.5 kg (173 lb 1 oz),  SpO2 92.00%.  General: Alert. Confused. Answers questions HEENT: Moist mucous membranes Lungs clear to auscultation bilaterally without wheezes rhonchi or rales Cardiovascular regular rate rhythm without murmurs gallops rubs Abdomen soft nontender nondistended Extremities no clubbing cyanosis or edema   Signed: Sye Schroepfer L 03/30/2012, 8:17 AM

## 2012-03-31 LAB — GLUCOSE, CAPILLARY
Glucose-Capillary: 109 mg/dL — ABNORMAL HIGH (ref 70–99)
Glucose-Capillary: 111 mg/dL — ABNORMAL HIGH (ref 70–99)
Glucose-Capillary: 124 mg/dL — ABNORMAL HIGH (ref 70–99)

## 2012-04-02 LAB — GLUCOSE, CAPILLARY
Glucose-Capillary: 134 mg/dL — ABNORMAL HIGH (ref 70–99)
Glucose-Capillary: 171 mg/dL — ABNORMAL HIGH (ref 70–99)

## 2012-04-02 IMAGING — CR DG CHEST 1V
1 series · 1 of 1 positions shown · non-contrast
Comparison: Chest radiograph 02/20/2009

CLINICAL DATA: We used

CHEST - 1 VIEW

[view not recorded]
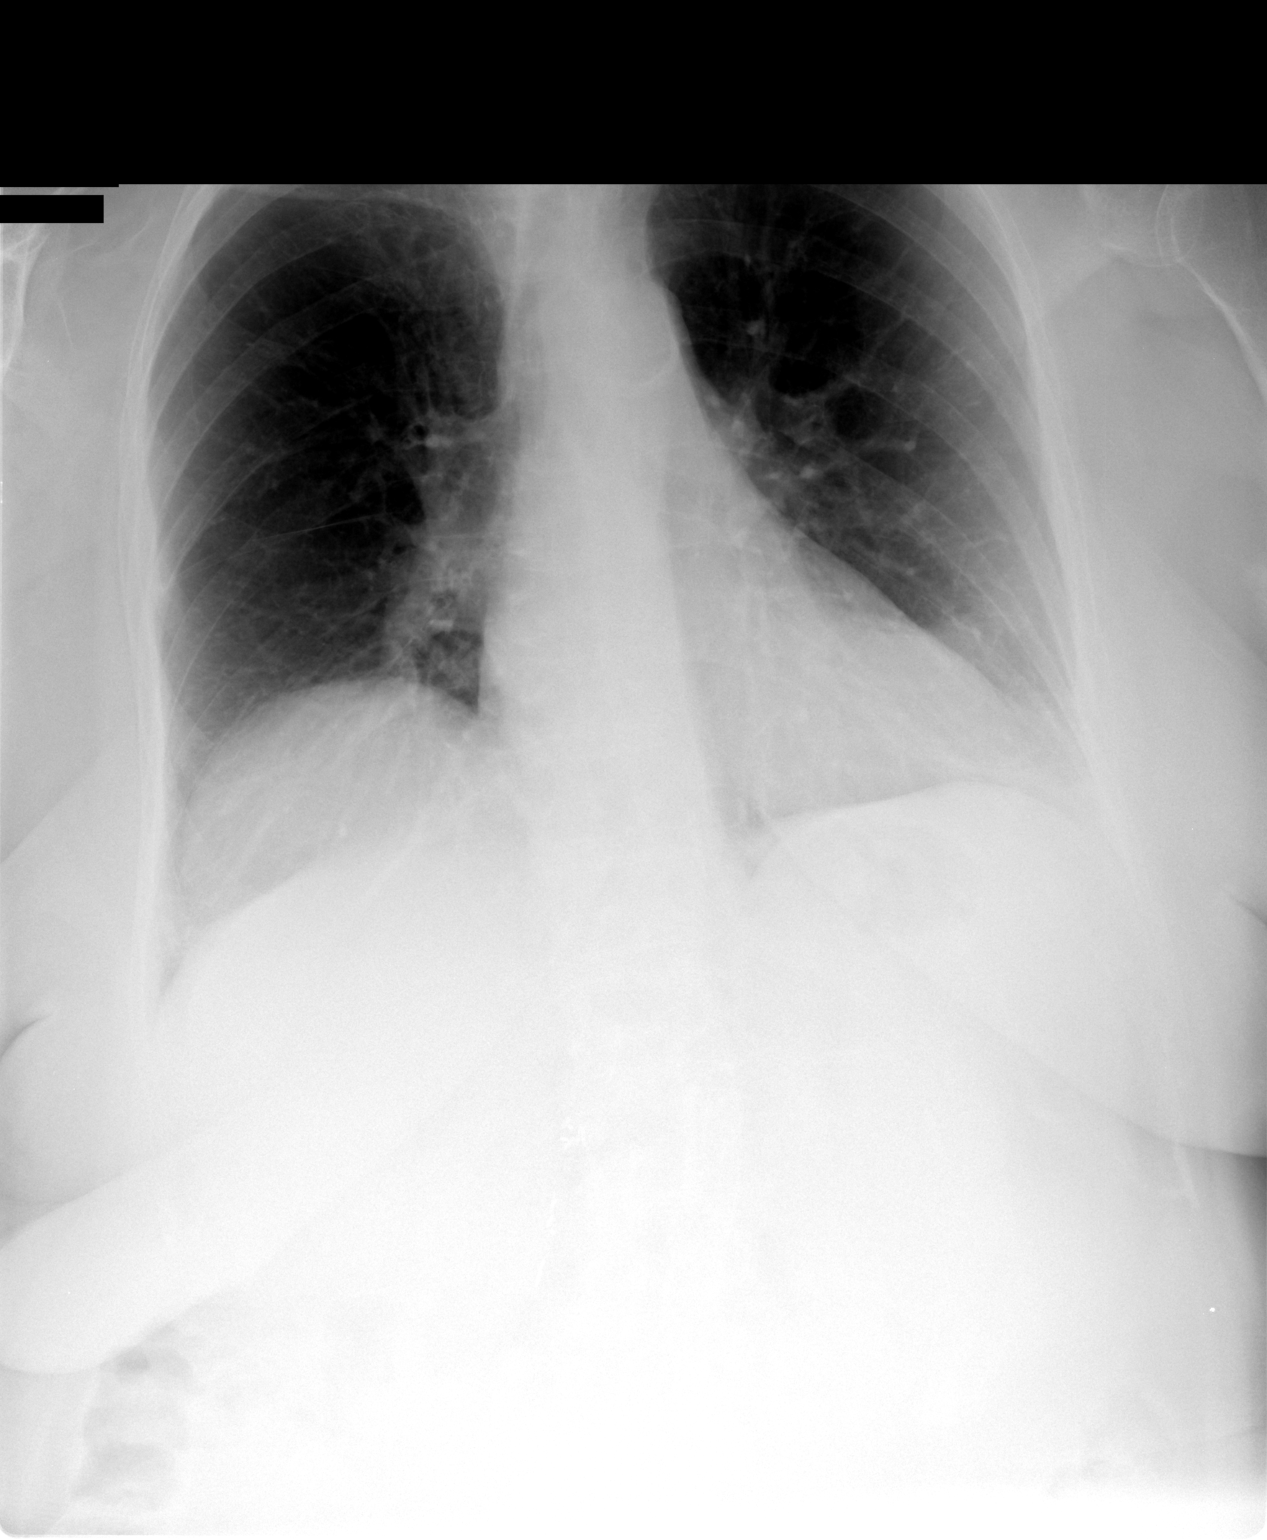

[1 of 1 positions shown; findings below may reference images not displayed]

FINDINGS: Normal mediastinum and cardiac silhouette.  Costophrenic
angles are clear.  No evidence effusion, infiltrate, pneumothorax.
Lungs are hyperinflated.
IMPRESSION: No acute cardiopulmonary process.

Emphysematous change

## 2012-04-05 LAB — GLUCOSE, CAPILLARY: Glucose-Capillary: 155 mg/dL — ABNORMAL HIGH (ref 70–99)

## 2012-04-07 LAB — GLUCOSE, CAPILLARY: Glucose-Capillary: 99 mg/dL (ref 70–99)

## 2012-04-09 LAB — GLUCOSE, CAPILLARY: Glucose-Capillary: 133 mg/dL — ABNORMAL HIGH (ref 70–99)

## 2012-04-10 LAB — GLUCOSE, CAPILLARY: Glucose-Capillary: 96 mg/dL (ref 70–99)

## 2012-04-12 LAB — GLUCOSE, CAPILLARY: Glucose-Capillary: 134 mg/dL — ABNORMAL HIGH (ref 70–99)

## 2012-04-13 LAB — GLUCOSE, CAPILLARY: Glucose-Capillary: 116 mg/dL — ABNORMAL HIGH (ref 70–99)

## 2012-04-14 LAB — GLUCOSE, CAPILLARY: Glucose-Capillary: 125 mg/dL — ABNORMAL HIGH (ref 70–99)

## 2012-04-19 LAB — GLUCOSE, CAPILLARY
Glucose-Capillary: 115 mg/dL — ABNORMAL HIGH (ref 70–99)
Glucose-Capillary: 80 mg/dL (ref 70–99)

## 2012-04-21 LAB — GLUCOSE, CAPILLARY
Glucose-Capillary: 137 mg/dL — ABNORMAL HIGH (ref 70–99)
Glucose-Capillary: 110 mg/dL — ABNORMAL HIGH (ref 70–99)

## 2012-04-23 LAB — GLUCOSE, CAPILLARY: Glucose-Capillary: 209 mg/dL — ABNORMAL HIGH (ref 70–99)

## 2012-04-26 LAB — GLUCOSE, CAPILLARY
Glucose-Capillary: 128 mg/dL — ABNORMAL HIGH (ref 70–99)
Glucose-Capillary: 139 mg/dL — ABNORMAL HIGH (ref 70–99)

## 2012-04-28 LAB — GLUCOSE, CAPILLARY: Glucose-Capillary: 130 mg/dL — ABNORMAL HIGH (ref 70–99)

## 2012-04-30 LAB — GLUCOSE, CAPILLARY: Glucose-Capillary: 109 mg/dL — ABNORMAL HIGH (ref 70–99)

## 2012-05-04 LAB — GLUCOSE, CAPILLARY: Glucose-Capillary: 100 mg/dL — ABNORMAL HIGH (ref 70–99)

## 2012-05-08 LAB — GLUCOSE, CAPILLARY: Glucose-Capillary: 119 mg/dL — ABNORMAL HIGH (ref 70–99)

## 2012-05-11 LAB — GLUCOSE, CAPILLARY: Glucose-Capillary: 117 mg/dL — ABNORMAL HIGH (ref 70–99)

## 2012-05-12 LAB — GLUCOSE, CAPILLARY: Glucose-Capillary: 99 mg/dL (ref 70–99)

## 2012-05-17 LAB — GLUCOSE, CAPILLARY: Glucose-Capillary: 148 mg/dL — ABNORMAL HIGH (ref 70–99)

## 2012-05-19 LAB — GLUCOSE, CAPILLARY: Glucose-Capillary: 214 mg/dL — ABNORMAL HIGH (ref 70–99)

## 2012-05-21 LAB — GLUCOSE, CAPILLARY: Glucose-Capillary: 118 mg/dL — ABNORMAL HIGH (ref 70–99)

## 2012-05-25 LAB — GLUCOSE, CAPILLARY: Glucose-Capillary: 173 mg/dL — ABNORMAL HIGH (ref 70–99)

## 2012-05-28 LAB — GLUCOSE, CAPILLARY: Glucose-Capillary: 118 mg/dL — ABNORMAL HIGH (ref 70–99)

## 2012-05-31 LAB — GLUCOSE, CAPILLARY: Glucose-Capillary: 133 mg/dL — ABNORMAL HIGH (ref 70–99)

## 2012-06-03 LAB — GLUCOSE, CAPILLARY: Glucose-Capillary: 125 mg/dL — ABNORMAL HIGH (ref 70–99)

## 2012-06-05 LAB — GLUCOSE, CAPILLARY: Glucose-Capillary: 158 mg/dL — ABNORMAL HIGH (ref 70–99)

## 2012-06-09 LAB — GLUCOSE, CAPILLARY: Glucose-Capillary: 99 mg/dL (ref 70–99)

## 2012-06-14 LAB — GLUCOSE, CAPILLARY
Glucose-Capillary: 159 mg/dL — ABNORMAL HIGH (ref 70–99)
Glucose-Capillary: 220 mg/dL — ABNORMAL HIGH (ref 70–99)

## 2012-06-16 LAB — GLUCOSE, CAPILLARY: Glucose-Capillary: 146 mg/dL — ABNORMAL HIGH (ref 70–99)

## 2012-06-19 LAB — GLUCOSE, CAPILLARY
Glucose-Capillary: 78 mg/dL (ref 70–99)
Glucose-Capillary: 101 mg/dL — ABNORMAL HIGH (ref 70–99)

## 2012-06-25 LAB — GLUCOSE, CAPILLARY
Glucose-Capillary: 128 mg/dL — ABNORMAL HIGH (ref 70–99)
Glucose-Capillary: 95 mg/dL (ref 70–99)

## 2012-06-28 LAB — GLUCOSE, CAPILLARY: Glucose-Capillary: 126 mg/dL — ABNORMAL HIGH (ref 70–99)

## 2012-06-30 LAB — GLUCOSE, CAPILLARY: Glucose-Capillary: 123 mg/dL — ABNORMAL HIGH (ref 70–99)

## 2012-07-03 LAB — GLUCOSE, CAPILLARY: Glucose-Capillary: 156 mg/dL — ABNORMAL HIGH (ref 70–99)

## 2012-07-06 LAB — GLUCOSE, CAPILLARY: Glucose-Capillary: 130 mg/dL — ABNORMAL HIGH (ref 70–99)

## 2012-07-07 LAB — GLUCOSE, CAPILLARY: Glucose-Capillary: 120 mg/dL — ABNORMAL HIGH (ref 70–99)

## 2012-07-12 LAB — GLUCOSE, CAPILLARY: Glucose-Capillary: 231 mg/dL — ABNORMAL HIGH (ref 70–99)

## 2012-07-16 ENCOUNTER — Ambulatory Visit (HOSPITAL_COMMUNITY)
Admit: 2012-07-16 | Discharge: 2012-07-16 | Disposition: A | Discharge status: Home / Self Care | Payer: Medicare HMO | Source: Ambulatory Visit | Attending: Internal Medicine | Admitting: Internal Medicine

## 2012-07-16 DIAGNOSIS — R059 Cough, unspecified: Secondary | ICD-10-CM | POA: Insufficient documentation

## 2012-07-16 DIAGNOSIS — R05 Cough: Secondary | ICD-10-CM | POA: Insufficient documentation

## 2012-07-16 LAB — GLUCOSE, CAPILLARY: Glucose-Capillary: 130 mg/dL — ABNORMAL HIGH (ref 70–99)

## 2012-07-20 LAB — GLUCOSE, CAPILLARY
Glucose-Capillary: 129 mg/dL — ABNORMAL HIGH (ref 70–99)
Glucose-Capillary: 183 mg/dL — ABNORMAL HIGH (ref 70–99)

## 2012-07-21 LAB — GLUCOSE, CAPILLARY

## 2012-07-26 LAB — GLUCOSE, CAPILLARY: Glucose-Capillary: 217 mg/dL — ABNORMAL HIGH (ref 70–99)

## 2012-07-29 LAB — GLUCOSE, CAPILLARY
Glucose-Capillary: 137 mg/dL — ABNORMAL HIGH (ref 70–99)
Glucose-Capillary: 217 mg/dL — ABNORMAL HIGH (ref 70–99)

## 2012-07-30 LAB — GLUCOSE, CAPILLARY: Glucose-Capillary: 140 mg/dL — ABNORMAL HIGH (ref 70–99)

## 2012-08-02 LAB — GLUCOSE, CAPILLARY: Glucose-Capillary: 132 mg/dL — ABNORMAL HIGH (ref 70–99)

## 2012-08-04 LAB — GLUCOSE, CAPILLARY: Glucose-Capillary: 124 mg/dL — ABNORMAL HIGH (ref 70–99)

## 2012-08-05 LAB — GLUCOSE, CAPILLARY: Glucose-Capillary: 158 mg/dL — ABNORMAL HIGH (ref 70–99)

## 2012-08-06 LAB — GLUCOSE, CAPILLARY
Glucose-Capillary: 132 mg/dL — ABNORMAL HIGH (ref 70–99)
Glucose-Capillary: 104 mg/dL — ABNORMAL HIGH (ref 70–99)

## 2012-08-09 LAB — GLUCOSE, CAPILLARY: Glucose-Capillary: 116 mg/dL — ABNORMAL HIGH (ref 70–99)

## 2012-08-13 ENCOUNTER — Ambulatory Visit (HOSPITAL_COMMUNITY): Payer: 59 | Attending: Internal Medicine

## 2012-08-13 ENCOUNTER — Inpatient Hospital Stay (HOSPITAL_COMMUNITY): Payer: 59

## 2012-08-13 DIAGNOSIS — R109 Unspecified abdominal pain: Secondary | ICD-10-CM | POA: Insufficient documentation

## 2012-08-13 DIAGNOSIS — K5900 Constipation, unspecified: Secondary | ICD-10-CM | POA: Insufficient documentation

## 2012-08-13 LAB — GLUCOSE, CAPILLARY

## 2012-08-16 LAB — GLUCOSE, CAPILLARY: Glucose-Capillary: 104 mg/dL — ABNORMAL HIGH (ref 70–99)

## 2012-08-18 LAB — GLUCOSE, CAPILLARY
Glucose-Capillary: 104 mg/dL — ABNORMAL HIGH (ref 70–99)
Glucose-Capillary: 91 mg/dL (ref 70–99)

## 2012-08-25 LAB — GLUCOSE, CAPILLARY: Glucose-Capillary: 115 mg/dL — ABNORMAL HIGH (ref 70–99)

## 2012-08-26 LAB — GLUCOSE, CAPILLARY

## 2012-08-27 LAB — GLUCOSE, CAPILLARY

## 2012-08-30 LAB — GLUCOSE, CAPILLARY: Glucose-Capillary: 116 mg/dL — ABNORMAL HIGH (ref 70–99)

## 2012-09-01 LAB — GLUCOSE, CAPILLARY
Glucose-Capillary: 114 mg/dL — ABNORMAL HIGH (ref 70–99)
Glucose-Capillary: 103 mg/dL — ABNORMAL HIGH (ref 70–99)

## 2012-09-06 LAB — GLUCOSE, CAPILLARY: Glucose-Capillary: 80 mg/dL (ref 70–99)

## 2012-09-09 LAB — GLUCOSE, CAPILLARY

## 2012-09-10 LAB — GLUCOSE, CAPILLARY: Glucose-Capillary: 107 mg/dL — ABNORMAL HIGH (ref 70–99)

## 2012-09-13 ENCOUNTER — Other Ambulatory Visit: Payer: Self-pay | Admitting: *Deleted

## 2012-09-13 LAB — GLUCOSE, CAPILLARY: Glucose-Capillary: 134 mg/dL — ABNORMAL HIGH (ref 70–99)

## 2012-09-13 MED ORDER — HYDROCODONE-ACETAMINOPHEN 5-325 MG PO TABS: ORAL_TABLET | ORAL | Status: DC

## 2012-09-17 LAB — GLUCOSE, CAPILLARY: Glucose-Capillary: 117 mg/dL — ABNORMAL HIGH (ref 70–99)

## 2012-09-20 LAB — GLUCOSE, CAPILLARY
Glucose-Capillary: 129 mg/dL — ABNORMAL HIGH (ref 70–99)
Glucose-Capillary: 141 mg/dL — ABNORMAL HIGH (ref 70–99)

## 2012-09-22 LAB — GLUCOSE, CAPILLARY: Glucose-Capillary: 143 mg/dL — ABNORMAL HIGH (ref 70–99)

## 2012-09-29 LAB — GLUCOSE, CAPILLARY
Glucose-Capillary: 163 mg/dL — ABNORMAL HIGH (ref 70–99)
Glucose-Capillary: 106 mg/dL — ABNORMAL HIGH (ref 70–99)
Glucose-Capillary: 131 mg/dL — ABNORMAL HIGH (ref 70–99)

## 2012-10-01 LAB — GLUCOSE, CAPILLARY
Glucose-Capillary: 124 mg/dL — ABNORMAL HIGH (ref 70–99)
Glucose-Capillary: 112 mg/dL — ABNORMAL HIGH (ref 70–99)

## 2012-10-06 LAB — GLUCOSE, CAPILLARY: Glucose-Capillary: 135 mg/dL — ABNORMAL HIGH (ref 70–99)

## 2012-10-08 LAB — GLUCOSE, CAPILLARY
Glucose-Capillary: 122 mg/dL — ABNORMAL HIGH (ref 70–99)
Glucose-Capillary: 132 mg/dL — ABNORMAL HIGH (ref 70–99)

## 2012-10-11 LAB — GLUCOSE, CAPILLARY: Glucose-Capillary: 106 mg/dL — ABNORMAL HIGH (ref 70–99)

## 2012-10-12 ENCOUNTER — Non-Acute Institutional Stay (SKILLED_NURSING_FACILITY): Payer: Medicare HMO | Admitting: Internal Medicine

## 2012-10-12 DIAGNOSIS — I4891 Unspecified atrial fibrillation: Secondary | ICD-10-CM

## 2012-10-12 DIAGNOSIS — I482 Chronic atrial fibrillation, unspecified: Secondary | ICD-10-CM

## 2012-10-12 DIAGNOSIS — I10 Essential (primary) hypertension: Secondary | ICD-10-CM

## 2012-10-12 DIAGNOSIS — K219 Gastro-esophageal reflux disease without esophagitis: Secondary | ICD-10-CM

## 2012-10-12 DIAGNOSIS — R05 Cough: Secondary | ICD-10-CM

## 2012-10-12 DIAGNOSIS — E119 Type 2 diabetes mellitus without complications: Secondary | ICD-10-CM

## 2012-10-12 DIAGNOSIS — F068 Other specified mental disorders due to known physiological condition: Secondary | ICD-10-CM

## 2012-10-12 DIAGNOSIS — F0390 Unspecified dementia without behavioral disturbance: Secondary | ICD-10-CM

## 2012-10-12 DIAGNOSIS — J449 Chronic obstructive pulmonary disease, unspecified: Secondary | ICD-10-CM

## 2012-10-12 DIAGNOSIS — I25810 Atherosclerosis of coronary artery bypass graft(s) without angina pectoris: Secondary | ICD-10-CM

## 2012-10-12 DIAGNOSIS — N179 Acute kidney failure, unspecified: Secondary | ICD-10-CM

## 2012-10-13 ENCOUNTER — Ambulatory Visit (HOSPITAL_COMMUNITY)
Admit: 2012-10-13 | Discharge: 2012-10-13 | Disposition: A | Discharge status: Home / Self Care | Payer: Medicare HMO | Attending: Internal Medicine | Admitting: Internal Medicine

## 2012-10-13 DIAGNOSIS — R5381 Other malaise: Secondary | ICD-10-CM | POA: Insufficient documentation

## 2012-10-13 DIAGNOSIS — R05 Cough: Secondary | ICD-10-CM | POA: Insufficient documentation

## 2012-10-13 DIAGNOSIS — I10 Essential (primary) hypertension: Secondary | ICD-10-CM | POA: Insufficient documentation

## 2012-10-13 DIAGNOSIS — R059 Cough, unspecified: Secondary | ICD-10-CM | POA: Insufficient documentation

## 2012-10-13 DIAGNOSIS — I25810 Atherosclerosis of coronary artery bypass graft(s) without angina pectoris: Secondary | ICD-10-CM | POA: Insufficient documentation

## 2012-10-13 LAB — GLUCOSE, CAPILLARY: Glucose-Capillary: 95 mg/dL (ref 70–99)

## 2012-10-13 NOTE — Progress Notes (Signed)
Patient ID: [name], female   DOB: November 09, 1924, 79 y.o.   MRN: [id number]  Chief complaint-medical management of coronary artery disease -arrhythmia-diabetes type 2-dementia-GERD-renal insufficiency-hypertension-acute visit secondary to cough.  History of present illness.  Patient is a long-standing resident of this facility with the above diagnoses she continues to be largely nonverbal and eating poorly at times although she has strong family support and they bring in food pretty much on a daily basis Her weight continues to be stable however in the mid 150s.   last year she was sent to the hospital with looking clammy she was on Cardizem and metoprolol her creatinine was 1.95 indicating some degree of dehydration.  Her Cardizem was discontinued  does continue on Lopressor --he has tolerated this well.  He is a type II diabetic on low-dose glipizide blood sugars appear satisfactory largely in the low 100s.  Earlier this year  she did have a couple syncopal episodes apparently this was related to having bowel movements-this may have been a vasovagal incident-there have been no recent recurrences to my knowledge  Blood pressures appear somewhat variable-- tonight was 148/75-I see ranges from 128/60-193/91 although this appears to be abnormally high-- her baseline appears to run more in the 1:30-150 range although I would like to get more readings  The most acute issue today is some cough and chest congestion-she does have a history of COPD and has when necessary nebulizers- Nursing staff has not noted any increased shortness of breath.    Family medical social history reviewed per progress note of 07/13/2012.  Medications have been reviewed per [facility]  Review of systems Limited secondary to dementia obtained from staff.  In general no fever or chills noted.  Respiratory-as stated in history of present illness.  Cardiac-no complaints of chest pain.  GI-no complaints of abdominal pain  nausea vomiting diarrhea-some constipation at times apparently Colace is helping.  Neurologic history of severe dementia-no complaints of headache dizziness or numbness.  Physical exam.  She's been afebrile-pulse 70 respirations 20 blood pressure 148/75-weight 156.4 this has been relatively stable recently.  In general this is a frail elderly female in no distress lying comfortably in bed.  Her skin is warm and dry.  Eyes-pupils appear equal round reactive to light sclera and conjunctiva are clear as visual acuity appears grossly intact.  Oropharynx clear mucous membranes moist.  Chest she does have some scattered coarse breath sounds with somewhat poor respiratory effort-no labored breathing.  Heart-is regular rate and rhythm with somewhat distant heart sounds- continues with baseline I would say trace lower extremity edema  Abdomen is somewhat obese soft nontender with positive bowel sounds.  Muscle skeletal Limited examination she is in bed but appears able to move all her extremities at baseline with lower extremity weakness-she does ambulate  In  wheelchair during the day.  Neurologic is grossly intact her speech is fairly minimal-cranial nerves appear grossly intact.  Psych she is oriented to self does follow simple verbal commands  Labs.  08/16/2012.  Sodium 139 potassium 4.7 BUN 31 creatinine 1.41.  08/13/2012  WBC 6.2 hemoglobin 10.7 platelets 229.  Liver function tests within normal limits except albumin of 2.9 bilirubin of 0.1  Assessment and plan.  #1-cough with chest congestion-will make duo- nebs routine every 6 hours for 72 hours and then when necessary-Mucinex 600 mg twice a day for 7 days-also will obtain a chest x-ray-and monitor vital signs with pulse ox every shift for 72 hours  #2-history of hypertension  she continues on low dose lisinopril-Will continue to monitor she is getting vital signs for the next 3 days would like to see those results and see  if there's consistent elevation of blood pressure  #3-diabetes type 2-this appears stable on low-dose glipizide.  #4-coronary artery disease-this has been asymptomatic he is on aspirin.  #5-dementia-this appears to be quite significant -- she appears to be doing relatively well with supportive care-she is on Namenda  #6-GERD-this appears to be stable on Zantac.  #7  history of arrhythmia-this appears to be stable on her current medication---  #8-history renal insufficiency- mostr recent lab appears to be at relative baseline will need to update   #9-constipation-she continues on Colace- no recent syncopal episodes to my knowledge she did have a couple earlier this year--this may have been vasovagal  #10-history of anemia-will update CBC  [id number]-of note more than 30 minutes spent assessing patient-and formulating plan of care for numerous diagnoses

## 2012-10-15 LAB — GLUCOSE, CAPILLARY: Glucose-Capillary: 108 mg/dL — ABNORMAL HIGH (ref 70–99)

## 2012-10-18 LAB — GLUCOSE, CAPILLARY
Glucose-Capillary: 134 mg/dL — ABNORMAL HIGH (ref 70–99)
Glucose-Capillary: 90 mg/dL (ref 70–99)

## 2012-10-20 LAB — GLUCOSE, CAPILLARY: Glucose-Capillary: 165 mg/dL — ABNORMAL HIGH (ref 70–99)

## 2012-10-22 ENCOUNTER — Emergency Department (HOSPITAL_COMMUNITY)
Admission: EM | Admit: 2012-10-22 | Discharge: 2012-10-22 | Disposition: A | Discharge status: Home / Self Care | Payer: Medicare HMO | Attending: Emergency Medicine | Admitting: Emergency Medicine

## 2012-10-22 ENCOUNTER — Encounter (HOSPITAL_COMMUNITY): Payer: Self-pay | Admitting: *Deleted

## 2012-10-22 ENCOUNTER — Emergency Department (HOSPITAL_COMMUNITY): Payer: Medicare HMO

## 2012-10-22 ENCOUNTER — Inpatient Hospital Stay
Admission: RE | Admit: 2012-10-22 | Discharge: 2013-06-16 | Disposition: A | Discharge status: Nursing Home (Includes ICF) | Payer: 59 | Source: Ambulatory Visit | Attending: Internal Medicine | Admitting: Internal Medicine

## 2012-10-22 DIAGNOSIS — Z79899 Other long term (current) drug therapy: Secondary | ICD-10-CM | POA: Insufficient documentation

## 2012-10-22 DIAGNOSIS — F0280 Dementia in other diseases classified elsewhere without behavioral disturbance: Secondary | ICD-10-CM | POA: Insufficient documentation

## 2012-10-22 DIAGNOSIS — R059 Cough, unspecified: Secondary | ICD-10-CM | POA: Insufficient documentation

## 2012-10-22 DIAGNOSIS — K219 Gastro-esophageal reflux disease without esophagitis: Secondary | ICD-10-CM | POA: Insufficient documentation

## 2012-10-22 DIAGNOSIS — J449 Chronic obstructive pulmonary disease, unspecified: Secondary | ICD-10-CM | POA: Insufficient documentation

## 2012-10-22 DIAGNOSIS — Z8669 Personal history of other diseases of the nervous system and sense organs: Secondary | ICD-10-CM | POA: Insufficient documentation

## 2012-10-22 DIAGNOSIS — R269 Unspecified abnormalities of gait and mobility: Secondary | ICD-10-CM | POA: Insufficient documentation

## 2012-10-22 DIAGNOSIS — E119 Type 2 diabetes mellitus without complications: Secondary | ICD-10-CM | POA: Insufficient documentation

## 2012-10-22 DIAGNOSIS — I10 Essential (primary) hypertension: Secondary | ICD-10-CM | POA: Insufficient documentation

## 2012-10-22 DIAGNOSIS — R05 Cough: Secondary | ICD-10-CM | POA: Insufficient documentation

## 2012-10-22 DIAGNOSIS — Z87440 Personal history of urinary (tract) infections: Secondary | ICD-10-CM | POA: Insufficient documentation

## 2012-10-22 DIAGNOSIS — J4489 Other specified chronic obstructive pulmonary disease: Secondary | ICD-10-CM | POA: Insufficient documentation

## 2012-10-22 DIAGNOSIS — Z7982 Long term (current) use of aspirin: Secondary | ICD-10-CM | POA: Insufficient documentation

## 2012-10-22 DIAGNOSIS — Z8639 Personal history of other endocrine, nutritional and metabolic disease: Secondary | ICD-10-CM | POA: Insufficient documentation

## 2012-10-22 DIAGNOSIS — G309 Alzheimer's disease, unspecified: Secondary | ICD-10-CM | POA: Insufficient documentation

## 2012-10-22 DIAGNOSIS — Z862 Personal history of diseases of the blood and blood-forming organs and certain disorders involving the immune mechanism: Secondary | ICD-10-CM | POA: Insufficient documentation

## 2012-10-22 DIAGNOSIS — Z8679 Personal history of other diseases of the circulatory system: Secondary | ICD-10-CM | POA: Insufficient documentation

## 2012-10-22 HISTORY — DX: Type 2 diabetes mellitus without complications: E11.9

## 2012-10-22 HISTORY — DX: Dementia in other diseases classified elsewhere, unspecified severity, without behavioral disturbance, psychotic disturbance, mood disturbance, and anxiety: F02.80

## 2012-10-22 HISTORY — DX: Alzheimer's disease, unspecified: G30.9

## 2012-10-22 LAB — BASIC METABOLIC PANEL
CO2: 23 mEq/L (ref 19–32)
Calcium: 8.8 mg/dL (ref 8.4–10.5)
Chloride: 99 mEq/L (ref 96–112)
GFR calc Af Amer: 43 mL/min — ABNORMAL LOW (ref >90)
Sodium: 130 mEq/L — ABNORMAL LOW (ref 135–145)

## 2012-10-22 LAB — CBC WITH DIFFERENTIAL/PLATELET
Basophils Relative: 0 % (ref 0–1)
Hemoglobin: 11.4 g/dL — ABNORMAL LOW (ref 12.0–15.0)
Lymphs Abs: 1.5 10*3/uL (ref 0.7–4.0)
Monocytes Relative: 8 % (ref 3–12)
Neutro Abs: 6.9 10*3/uL (ref 1.7–7.7)
Neutrophils Relative %: 69 % (ref 43–77)
Platelets: 231 10*3/uL (ref 150–400)
RBC: 3.73 MIL/uL — ABNORMAL LOW (ref 3.87–5.11)

## 2012-10-22 LAB — PRO B NATRIURETIC PEPTIDE: Pro B Natriuretic peptide (BNP): 1286 pg/mL — ABNORMAL HIGH (ref 0–450)

## 2012-10-22 LAB — PROTIME-INR
INR: 1 (ref 0.00–1.49)
Prothrombin Time: 13.1 seconds (ref 11.6–15.2)

## 2012-10-22 LAB — GLUCOSE, CAPILLARY: Glucose-Capillary: 125 mg/dL — ABNORMAL HIGH (ref 70–99)

## 2012-10-22 LAB — TROPONIN I: Troponin I: <0.3 ng/mL (ref <0.30)

## 2012-10-22 MED ORDER — PREDNISONE 20 MG PO TABS: 40.0000 mg | ORAL_TABLET | Freq: Every day | ORAL | Status: DC

## 2012-10-22 MED ORDER — METHYLPREDNISOLONE SODIUM SUCC 125 MG IJ SOLR
125.0000 mg | Freq: Once | INTRAMUSCULAR | Status: AC
  Administered 2012-10-22: 125 mg via INTRAVENOUS
  Filled 2012-10-22: qty 2

## 2012-10-22 NOTE — ED Notes (Signed)
Resident at [facility] - EMS called out for sob and decreased O2 sats.  Pt denies pain, No resp distress noted.  O2 sats upon arrival 92% on RA.  nad noted.

## 2012-10-22 NOTE — ED Provider Notes (Signed)
History    This chart was scribed for Andrea B. Bernette Mayers, MD by Toya Smothers, ED Scribe. The patient was seen in room APA08/APA08. Patient's care was started at 2124.   CSN: [id number]  Arrival date & time 10/22/12  2124   None     Chief Complaint  Patient presents with  . Shortness of Breath    Patient is a 79 y.o. female presenting with shortness of breath. The history is provided by a relative and the patient. No language interpreter was used.  Shortness of Breath Associated symptoms: cough     HPI Comments:  [name] is a 79 y.o. female BIB EMS from [facility], who presents to the Emergency Department for gradual onset, waxing and waning SOB with decreased O2 sats near 70%. Pt endorses associate non-productive cough. Per Daughter, Pt was recently evaluated for similar symptoms with CT chest scan, and there were no pertinent findings. Despite being administered breathing treatments for the past 2 weeks, there has been no lasting improvement. No recent steroids. Pt denies headache, diaphoresis, fever, chills, nausea, vomiting, diarrhea, weakness, and any other pain. Pt denies use of tobacco, alcohol, and illicit drug use. Medical hx includes Azotemia, HTN, DM without complication, and organic brain syndrome.   PCP: Dr. Roxan Hockey    Past Medical History  Diagnosis Date  . Falls frequently   . Cardiac dysrhythmia   . Anemia   . Organic brain syndrome   . Difficulty walking   . Acute renal failure   . Azotemia   . Hyperkalemia   . UTI (lower urinary tract infection)   . Syncope   . Hypertension   . Acid reflux   . Alzheimer disease   . Diabetes mellitus without complication   . UTI (lower urinary tract infection)     Past Surgical History  Procedure Laterality Date  . Joint replacement      right hip  . Joint replacement      left hip 2012  . Cardiac surgery      stent  . Appendectomy    . Cholecystectomy    . Abdominal hysterectomy      Family History   Problem Relation Age of Onset  . Diabetes Mother   . Dementia Brother   . Dementia Brother   . Fibromyalgia Daughter     History  Substance Use Topics  . Smoking status: Never Smoker   . Smokeless tobacco: Not on file  . Alcohol Use: No     Comment: unable to assess    Review of Systems  Respiratory: Positive for cough and shortness of breath.   Skin: Positive for pallor.  All other systems reviewed and are negative.    Allergies  Contrast media; Statins; Sulfa antibiotics; and Vesicare  Home Medications   Current Outpatient Rx  Name  Route  Sig  Dispense  Refill  . aspirin 325 MG tablet   Oral   Take 325 mg by mouth daily.           . calcium carbonate (TUMS - DOSED IN MG ELEMENTAL CALCIUM) 500 MG chewable tablet   Oral   Chew 1 tablet by mouth 2 (two) times daily.          . Cholecalciferol (VITAMIN D-3) 1000 UNITS CAPS   Oral   Take 2 capsules by mouth daily.         . diphenhydrAMINE (BANOPHEN) 25 MG tablet   Oral   Take 12.5 mg by mouth  every 8 (eight) hours as needed (Pruritis).         Marland Kitchen docusate sodium (COLACE) 100 MG capsule   Oral   Take 100 mg by mouth 2 (two) times daily.          Marland Kitchen ENSURE (ENSURE)   Oral   Take 237 mLs by mouth 2 (two) times daily.         Marland Kitchen glipiZIDE (GLUCOTROL XL) 2.5 MG 24 hr tablet   Oral   Take 2.5 mg by mouth daily.           Marland Kitchen HYDROcodone-acetaminophen (NORCO/VICODIN) 5-325 MG per tablet      Take 1 tablet every 4 hours as needed for pain   180 tablet   5   . ipratropium-albuterol (DUONEB) 0.5-2.5 (3) MG/3ML SOLN   Nebulization   Take 3 mLs by nebulization every 6 (six) hours as needed. SOB/Wheezing          . lisinopril (PRINIVIL,ZESTRIL) 2.5 MG tablet   Oral   Take 2.5 mg by mouth daily.         Marland Kitchen LORazepam (ATIVAN) 0.5 MG tablet   Oral   Take 0.5 mg by mouth every 8 (eight) hours as needed. Anxiety.         . memantine (NAMENDA) 10 MG tablet   Oral   Take 10 mg by mouth 2 (two)  times daily.           . metoprolol (LOPRESSOR) 50 MG tablet   Oral   Take 50 mg by mouth 2 (two) times daily. *HOLD for SBP 100 or PULSE 50*         . potassium chloride (K-DUR,KLOR-CON) 10 MEQ tablet   Oral   Take 10 mEq by mouth 2 (two) times daily.         . propafenone (RYTHMOL) 225 MG tablet   Oral   Take 225 mg by mouth 2 (two) times daily.           . ranitidine (ZANTAC) 75 MG tablet   Oral   Take 75 mg by mouth 2 (two) times daily.         Marland Kitchen senna (SENOKOT) 8.6 MG tablet   Oral   Take 1 tablet by mouth 2 (two) times daily as needed. Constipation.         Marland Kitchen acetaminophen (TYLENOL) 325 MG tablet   Oral   Take 650 mg by mouth every 4 (four) hours as needed. Pain.           BP 164/53  Pulse 72  Temp(Src) 98 F (36.7 C) (Oral)  Resp 20  SpO2 92%  Physical Exam  Nursing note and vitals reviewed. Constitutional: She appears well-developed and well-nourished.  HENT:  Head: Normocephalic and atraumatic.  Eyes: EOM are normal. Pupils are equal, round, and reactive to light.  Neck: Normal range of motion. Neck supple.  Cardiovascular: Normal rate, normal heart sounds and intact distal pulses.   Pulmonary/Chest: Effort normal. She has no wheezes. She has rales.  bibasilar rales  Abdominal: Bowel sounds are normal. She exhibits no distension. There is no tenderness.  Musculoskeletal: Normal range of motion. She exhibits no edema and no tenderness.  Neurological: She is alert. She has normal strength. No cranial nerve deficit or sensory deficit.  Skin: Skin is warm and dry. No rash noted.  Psychiatric: She has a normal mood and affect.    ED Course  Procedures DIAGNOSTIC STUDIES: Oxygen Saturation is 92% on  [place], low by my interpretation.    COORDINATION OF CARE: 21:33- Evaluated Pt. Pt is awake, alert, and without distress. 21:38- Ordered DG Chest 2 View 1 time imaging and ED EKG Once. 21:40- Family understand and agree with initial ED impression  and plan with expectations set for ED visit. Will continue to monitor.    Labs Reviewed  CBC WITH DIFFERENTIAL - Abnormal; Notable for the following:    RBC 3.73 (*)    Hemoglobin 11.4 (*)    HCT 34.1 (*)    Eosinophils Relative 8 (*)    Eosinophils Absolute 0.8 (*)    All other components within normal limits  PRO B NATRIURETIC PEPTIDE - Abnormal; Notable for the following:    Pro B Natriuretic peptide (BNP) 1286.0 (*)    All other components within normal limits  BASIC METABOLIC PANEL - Abnormal; Notable for the following:    Sodium 130 (*)    Glucose, Bld 159 (*)    BUN 24 (*)    Creatinine, Ser 1.27 (*)    GFR calc non Af Amer 37 (*)    GFR calc Af Amer 43 (*)    All other components within normal limits  TROPONIN I  PROTIME-INR  APTT   Dg Chest 2 View  10/22/2012   *RADIOLOGY REPORT*  Clinical Data: Cough and SOB  CHEST - 2 VIEW  Comparison: 10/13/2012  Findings: The heart size and mediastinal contours are within normal limits.  Both lungs are clear. Calcified atherosclerotic disease affects the thoracic aorta.  The visualized skeletal structures are unremarkable.  IMPRESSION:  No acute cardiopulmonary abnormalities.   Original Report Authenticated By: Signa Kell, M.D.     1. COPD (chronic obstructive pulmonary disease)       MDM   Date: 10/22/2012  Rate: 70  Rhythm: normal sinus rhythm  QRS Axis: left  Intervals: normal  ST/T Wave abnormalities: nonspecific T wave changes  Conduction Disutrbances:nonspecific intraventricular conduction delay  Narrative Interpretation:   Old EKG Reviewed: unchanged    11:14 PM Pt resting comfortably. No wheezing, CXR clear and no significant hypoxia on room air. She has borderline elevated BNP, but with clear CXR doubt this is acute CHF. Will give short course of prednisone for persistent COPD symptoms, family advised to follow up with PCP at SNF.    I personally performed the services described in this documentation,  which was scribed in my presence. The recorded information has been reviewed and is accurate.      Andrea B. Bernette Mayers, MD 10/22/12 2315

## 2012-10-27 LAB — GLUCOSE, CAPILLARY: Glucose-Capillary: 144 mg/dL — ABNORMAL HIGH (ref 70–99)

## 2012-10-29 LAB — GLUCOSE, CAPILLARY
Glucose-Capillary: 126 mg/dL — ABNORMAL HIGH (ref 70–99)
Glucose-Capillary: 152 mg/dL — ABNORMAL HIGH (ref 70–99)

## 2012-11-01 LAB — GLUCOSE, CAPILLARY
Glucose-Capillary: 145 mg/dL — ABNORMAL HIGH (ref 70–99)
Glucose-Capillary: 165 mg/dL — ABNORMAL HIGH (ref 70–99)

## 2012-11-03 LAB — GLUCOSE, CAPILLARY

## 2012-11-05 LAB — GLUCOSE, CAPILLARY: Glucose-Capillary: 137 mg/dL — ABNORMAL HIGH (ref 70–99)

## 2012-11-06 ENCOUNTER — Ambulatory Visit (HOSPITAL_COMMUNITY): Payer: Medicare HMO | Attending: Internal Medicine

## 2012-11-06 DIAGNOSIS — R0602 Shortness of breath: Secondary | ICD-10-CM | POA: Insufficient documentation

## 2012-11-06 DIAGNOSIS — R0902 Hypoxemia: Secondary | ICD-10-CM | POA: Insufficient documentation

## 2012-11-08 LAB — GLUCOSE, CAPILLARY: Glucose-Capillary: 121 mg/dL — ABNORMAL HIGH (ref 70–99)

## 2012-11-10 LAB — GLUCOSE, CAPILLARY

## 2012-11-12 LAB — GLUCOSE, CAPILLARY: Glucose-Capillary: 146 mg/dL — ABNORMAL HIGH (ref 70–99)

## 2012-11-14 LAB — GLUCOSE, CAPILLARY

## 2012-11-15 LAB — GLUCOSE, CAPILLARY: Glucose-Capillary: 153 mg/dL — ABNORMAL HIGH (ref 70–99)

## 2012-11-18 LAB — GLUCOSE, CAPILLARY: Glucose-Capillary: 122 mg/dL — ABNORMAL HIGH (ref 70–99)

## 2012-11-19 LAB — GLUCOSE, CAPILLARY
Glucose-Capillary: 114 mg/dL — ABNORMAL HIGH (ref 70–99)
Glucose-Capillary: 109 mg/dL — ABNORMAL HIGH (ref 70–99)

## 2012-11-22 LAB — GLUCOSE, CAPILLARY: Glucose-Capillary: 157 mg/dL — ABNORMAL HIGH (ref 70–99)

## 2012-11-24 LAB — GLUCOSE, CAPILLARY: Glucose-Capillary: 106 mg/dL — ABNORMAL HIGH (ref 70–99)

## 2012-11-29 LAB — GLUCOSE, CAPILLARY: Glucose-Capillary: 126 mg/dL — ABNORMAL HIGH (ref 70–99)

## 2012-12-01 LAB — GLUCOSE, CAPILLARY: Glucose-Capillary: 118 mg/dL — ABNORMAL HIGH (ref 70–99)

## 2012-12-03 LAB — GLUCOSE, CAPILLARY
Glucose-Capillary: 139 mg/dL — ABNORMAL HIGH (ref 70–99)
Glucose-Capillary: 121 mg/dL — ABNORMAL HIGH (ref 70–99)

## 2012-12-08 LAB — GLUCOSE, CAPILLARY
Glucose-Capillary: 109 mg/dL — ABNORMAL HIGH (ref 70–99)
Glucose-Capillary: 126 mg/dL — ABNORMAL HIGH (ref 70–99)

## 2012-12-09 ENCOUNTER — Non-Acute Institutional Stay (SKILLED_NURSING_FACILITY): Payer: Medicare HMO | Admitting: Internal Medicine

## 2012-12-09 DIAGNOSIS — K219 Gastro-esophageal reflux disease without esophagitis: Secondary | ICD-10-CM

## 2012-12-09 DIAGNOSIS — F0280 Dementia in other diseases classified elsewhere without behavioral disturbance: Secondary | ICD-10-CM

## 2012-12-09 DIAGNOSIS — I25810 Atherosclerosis of coronary artery bypass graft(s) without angina pectoris: Secondary | ICD-10-CM

## 2012-12-09 DIAGNOSIS — N289 Disorder of kidney and ureter, unspecified: Secondary | ICD-10-CM

## 2012-12-09 DIAGNOSIS — N179 Acute kidney failure, unspecified: Secondary | ICD-10-CM

## 2012-12-09 DIAGNOSIS — I482 Chronic atrial fibrillation, unspecified: Secondary | ICD-10-CM

## 2012-12-09 DIAGNOSIS — E119 Type 2 diabetes mellitus without complications: Secondary | ICD-10-CM

## 2012-12-09 DIAGNOSIS — I4891 Unspecified atrial fibrillation: Secondary | ICD-10-CM

## 2012-12-09 DIAGNOSIS — I10 Essential (primary) hypertension: Secondary | ICD-10-CM

## 2012-12-09 DIAGNOSIS — J449 Chronic obstructive pulmonary disease, unspecified: Secondary | ICD-10-CM

## 2012-12-09 NOTE — Progress Notes (Signed)
Patient ID: [name], female   DOB: April 19, 1925, 79 y.o.   MRN: [id number]  This is a routine visit.  Level of care skilled.  Facility [facility]  Chief complaint-medical management of coronary artery disease -arrhythmia-diabetes type 2-dementia-GERD-renal insufficiency-hypertensionCOPD   History of present illness.  Patient is a long-standing resident of this facility with the above diagnoses she continues to be largely nonverbal and eating poorly at times although she has strong family support and they bring in food pretty much on a daily basis  Her weight continues to be stable however in the mid 150s  She apparently went to the hospital several weeks ago with shortness of breath-diagnosed with COPD-she does have when necessary nebulizers and this apparently has been stable in the interim .  last year she was sent to the hospital with looking clammy she was on Cardizem and metoprolol her creatinine was 1.95 indicating some degree of dehydration.  Her Cardizem was discontinued does continue on Lopressor --he has tolerated this well.   is a type II diabetic on low-dose glipizide blood sugars appear satisfactory largely in the low 100s.  Earlier this year she did have a couple syncopal episodes apparently this was related to having bowel movements-this may have been a vasovagal incident-there have been no recent recurrences to my knowledge  Blood pressures appear somewhat variable-- I see ranging from 156/66--126/89-generally in this range In addition to Lopressor she is on low dose lisinopril 2.5 mg a day   .  Family medical social history reviewed per progress note of 07/13/2012.   Medications have been reviewed  Review of systems Limited secondary to dementia obtained from staff.  In general no fever or chills noted.  Respiratory-as stated in history of present illness.--No recent reports of increased shortness of breath or cough  Cardiac-no complaints of chest pain.  GI-no complaints  of abdominal pain nausea vomiting diarrhea-some constipation at times apparently Colace is helping.  Neurologic history of severe dementia-no complaints of headache dizziness or numbness .  Physical exam Temperature 97.9 pulse 85 respirations 18 blood pressure 156/56-126/89-in this range.   .  In general this is a frail elderly female in no distress lying comfortably in bed.  Her skin is warm and dry.  Eyes-pupils appear equal round reactive to light sclera and conjunctiva are clear as visual acuity appears grossly intact.  Oropharynx clear mucous membranes moist.  Chest --clear to auscultation somewhat poor respiratory effort-no labored breathing.  Heart-is regular rate and rhythm with somewhat distant heart sounds- continues with baseline I would say trace lower extremity edema  Abdomen is somewhat obese soft nontender with positive bowel sounds.  Muscle skeletal Limited examination she is in bed but appears able to move all her extremities at baseline with lower extremity weakness-she does ambulate In wheelchair during the day.  Neurologic is grossly intact her speech is fairly minimal-cranial nerves appear grossly intact.  Psych she is oriented to self does follow simple verbal commands    Labs  10/13/2012.  WBC 7.2 hemoglobin 11.5 platelets 243.  Sodium 138 potassium 4.6 BUN 34 creatinine 1.41  08/13/2012.  Liver function tests within normal limits except bilirubin 0.1 albumin of 2.9.    .  08/16/2012.  Sodium 139 potassium 4.7 BUN 31 creatinine 1.41.  08/13/2012  WBC 6.2 hemoglobin 10.7 platelets 229.  Liver function tests within normal limits except albumin of 2.9 bilirubin of 0.1    Assessment and plan.    #1-history of hypertension she continues on low  dose lisinopril--as well as Lopressor-it appears the majority of the systolics to have some elevation 140 to 150s generally although I usually see a 120s and 130s-will increase lisinopril from 2.5-  To 5 mg a day and  monitor blood pressure checks twice a day with a log for review next week  #2-history renal insufficiency-creatinine of 1.4 recently appears to be baseline will update this also will update BMP in 2 weeks secondary to increasing the dose of the ACE inhibitor   #3-diabetes type 2-this appears stable on low-dose glipizide.   #4-coronary artery disease-this has been asymptomatic  is on aspirin.   #5-dementia-this appears to be quite significant -- she appears to be doing relatively well with supportive care-she is on Namenda   #6-GERD-this appears to be stable on Zantac.   #7 history of arrhythmia-this appears to be stable on her current medication---    #8-constipation-she continues on Colace- no recent syncopal episodes to my knowledge she did have a couple earlier this year--this may have been vasovagal   #10-history of anemia-will update CBC  Number 11 -- history COPD-she does continue on when necessary nebulizers this appears to be stable presently     [id number]-of note more than 30 minutes spent assessing patient-and  coordinating and  formulating plan of care for numerous diagnoses

## 2012-12-10 LAB — GLUCOSE, CAPILLARY
Glucose-Capillary: 101 mg/dL — ABNORMAL HIGH (ref 70–99)
Glucose-Capillary: 113 mg/dL — ABNORMAL HIGH (ref 70–99)

## 2012-12-13 LAB — GLUCOSE, CAPILLARY: Glucose-Capillary: 105 mg/dL — ABNORMAL HIGH (ref 70–99)

## 2012-12-14 ENCOUNTER — Non-Acute Institutional Stay (SKILLED_NURSING_FACILITY): Payer: Medicare HMO | Admitting: Internal Medicine

## 2012-12-14 DIAGNOSIS — I10 Essential (primary) hypertension: Secondary | ICD-10-CM

## 2012-12-14 DIAGNOSIS — J449 Chronic obstructive pulmonary disease, unspecified: Secondary | ICD-10-CM

## 2012-12-14 NOTE — Progress Notes (Signed)
Patient ID: [name], female   DOB: 1924/09/06, 79 y.o.   MRN: [id number] Acute visit  Facility[facility]  LOC-Skilled   Chief complaint-acute visit secondary to family concerns about patient's breathing.--Also followup hypertension  History of present illness  Patient is an elderly resident with significant dementia as well as a history of COPD with some history of pneumonia.  I spoke with her daughter this evening she somewhat concerned saying it appears her mother is breathing a bit differently and would like me to assess her.  Of note also her blood pressure medication --lisinopril was recently increased secondary to some elevated systolics at times I have reviewed her log --- appears blood pressures are somewhat better although variable I see 109/56-145/39  One was    1 84/67 although I suspect this was done by machine and not really accurate  Patient is not complaining of any shortness of breath nursing has not really noted any significant change her vital signs continued to be stable as well as O2 saturations in the 90s.  Family medical social history has been reviewed.  Medications have been reviewed per [facility] she does have when necessary duo Neb nebulizers.  Review of systems-again essentially unobtainable she is not complaining of any chest pain or shortness of breath when asked-nursing staff has not noted any changes.  Physical exam.  She is afebrile pulse of 65 respirations 16 blood pressure as noted in history of present illness.  In general this is a somewhat frail elderly female in no distress lying comfortably in bed her head is elevated.  Chest is clear to auscultation without any increased respiratory effort I could not really hear any wheezing this evening she does have somewhat shallow air entry but this is not really new.  Heart is irregular irregular rate and rhythm without murmur gallop rub she does not have significant lower extremity edema.  Neurologic grossly  intact I could not see any focal deficits moves her extremities at baseline-cranial nerves appear grossly intact.  Psych-she is oriented to self he is alert this is essentially her baseline.  Labs.  11/02/2012.  [employer] [facility] 7.2 hemoglobin 11.5 platelets 243.  Sodium 138 potassium 4.6 BUN 34 creatinine 1.41 this is her baseline.  Assessment and plan.  History COPD-question breathing abnormalities-at this point she appears to be at her baseline physical exam was quite unremarkable-will make her duo nebs routine every 6 hours for 24 hours and then when necessary also monitor vital signs pulse ox every shift x48 hours.  She did have x-rays done about a month ago which were unremarkable we'll just have to keep a close eye on her.  #2-hypertension-this appears to be somewhat improved although variable readings we'll continue to monitor updated labs are pending since her lisinopril was increased.  [id number]   .

## 2012-12-15 LAB — GLUCOSE, CAPILLARY
Glucose-Capillary: 99 mg/dL (ref 70–99)
Glucose-Capillary: 133 mg/dL — ABNORMAL HIGH (ref 70–99)

## 2012-12-20 LAB — GLUCOSE, CAPILLARY
Glucose-Capillary: 105 mg/dL — ABNORMAL HIGH (ref 70–99)
Glucose-Capillary: 163 mg/dL — ABNORMAL HIGH (ref 70–99)

## 2012-12-24 LAB — GLUCOSE, CAPILLARY: Glucose-Capillary: 126 mg/dL — ABNORMAL HIGH (ref 70–99)

## 2012-12-25 LAB — GLUCOSE, CAPILLARY: Glucose-Capillary: 140 mg/dL — ABNORMAL HIGH (ref 70–99)

## 2012-12-27 LAB — GLUCOSE, CAPILLARY: Glucose-Capillary: 98 mg/dL (ref 70–99)

## 2013-01-03 LAB — GLUCOSE, CAPILLARY
Glucose-Capillary: 118 mg/dL — ABNORMAL HIGH (ref 70–99)
Glucose-Capillary: 129 mg/dL — ABNORMAL HIGH (ref 70–99)

## 2013-01-05 LAB — GLUCOSE, CAPILLARY: Glucose-Capillary: 126 mg/dL — ABNORMAL HIGH (ref 70–99)

## 2013-01-06 LAB — GLUCOSE, CAPILLARY: Glucose-Capillary: 99 mg/dL (ref 70–99)

## 2013-01-07 LAB — GLUCOSE, CAPILLARY: Glucose-Capillary: 134 mg/dL — ABNORMAL HIGH (ref 70–99)

## 2013-01-10 LAB — GLUCOSE, CAPILLARY: Glucose-Capillary: 139 mg/dL — ABNORMAL HIGH (ref 70–99)

## 2013-01-12 LAB — GLUCOSE, CAPILLARY
Glucose-Capillary: 119 mg/dL — ABNORMAL HIGH (ref 70–99)
Glucose-Capillary: 140 mg/dL — ABNORMAL HIGH (ref 70–99)

## 2013-01-14 LAB — GLUCOSE, CAPILLARY
Glucose-Capillary: 138 mg/dL — ABNORMAL HIGH (ref 70–99)
Glucose-Capillary: 154 mg/dL — ABNORMAL HIGH (ref 70–99)

## 2013-01-19 LAB — GLUCOSE, CAPILLARY: Glucose-Capillary: 164 mg/dL — ABNORMAL HIGH (ref 70–99)

## 2013-01-24 LAB — GLUCOSE, CAPILLARY
Glucose-Capillary: 131 mg/dL — ABNORMAL HIGH (ref 70–99)
Glucose-Capillary: 100 mg/dL — ABNORMAL HIGH (ref 70–99)

## 2013-01-26 LAB — GLUCOSE, CAPILLARY: Glucose-Capillary: 118 mg/dL — ABNORMAL HIGH (ref 70–99)

## 2013-01-27 ENCOUNTER — Ambulatory Visit (HOSPITAL_COMMUNITY): Payer: Medicare HMO | Attending: Internal Medicine

## 2013-01-27 DIAGNOSIS — J3489 Other specified disorders of nose and nasal sinuses: Secondary | ICD-10-CM | POA: Insufficient documentation

## 2013-01-27 DIAGNOSIS — R059 Cough, unspecified: Secondary | ICD-10-CM | POA: Insufficient documentation

## 2013-01-27 DIAGNOSIS — R05 Cough: Secondary | ICD-10-CM | POA: Insufficient documentation

## 2013-01-28 LAB — GLUCOSE, CAPILLARY

## 2013-01-30 LAB — GLUCOSE, CAPILLARY: Glucose-Capillary: 107 mg/dL — ABNORMAL HIGH (ref 70–99)

## 2013-01-31 LAB — GLUCOSE, CAPILLARY: Glucose-Capillary: 137 mg/dL — ABNORMAL HIGH (ref 70–99)

## 2013-02-02 LAB — GLUCOSE, CAPILLARY: Glucose-Capillary: 128 mg/dL — ABNORMAL HIGH (ref 70–99)

## 2013-02-08 LAB — GLUCOSE, CAPILLARY: Glucose-Capillary: 105 mg/dL — ABNORMAL HIGH (ref 70–99)

## 2013-02-11 LAB — GLUCOSE, CAPILLARY: Glucose-Capillary: 166 mg/dL — ABNORMAL HIGH (ref 70–99)

## 2013-02-14 LAB — GLUCOSE, CAPILLARY
Glucose-Capillary: 101 mg/dL — ABNORMAL HIGH (ref 70–99)
Glucose-Capillary: 85 mg/dL (ref 70–99)

## 2013-02-16 LAB — GLUCOSE, CAPILLARY: Glucose-Capillary: 144 mg/dL — ABNORMAL HIGH (ref 70–99)

## 2013-02-17 ENCOUNTER — Other Ambulatory Visit: Payer: Self-pay | Admitting: *Deleted

## 2013-02-17 MED ORDER — LORAZEPAM 0.5 MG PO TABS: 0.5000 mg | ORAL_TABLET | Freq: Three times a day (TID) | ORAL | Status: DC | PRN

## 2013-02-18 LAB — GLUCOSE, CAPILLARY

## 2013-02-21 LAB — GLUCOSE, CAPILLARY: Glucose-Capillary: 152 mg/dL — ABNORMAL HIGH (ref 70–99)

## 2013-02-23 LAB — GLUCOSE, CAPILLARY
Glucose-Capillary: 125 mg/dL — ABNORMAL HIGH (ref 70–99)
Glucose-Capillary: 146 mg/dL — ABNORMAL HIGH (ref 70–99)

## 2013-02-25 LAB — GLUCOSE, CAPILLARY: Glucose-Capillary: 125 mg/dL — ABNORMAL HIGH (ref 70–99)

## 2013-02-28 LAB — GLUCOSE, CAPILLARY: Glucose-Capillary: 98 mg/dL (ref 70–99)

## 2013-03-04 LAB — GLUCOSE, CAPILLARY
Glucose-Capillary: 117 mg/dL — ABNORMAL HIGH (ref 70–99)
Glucose-Capillary: 145 mg/dL — ABNORMAL HIGH (ref 70–99)

## 2013-03-07 LAB — GLUCOSE, CAPILLARY
Glucose-Capillary: 122 mg/dL — ABNORMAL HIGH (ref 70–99)
Glucose-Capillary: 129 mg/dL — ABNORMAL HIGH (ref 70–99)

## 2013-03-09 LAB — GLUCOSE, CAPILLARY: Glucose-Capillary: 98 mg/dL (ref 70–99)

## 2013-03-14 LAB — GLUCOSE, CAPILLARY: Glucose-Capillary: 196 mg/dL — ABNORMAL HIGH (ref 70–99)

## 2013-03-16 LAB — GLUCOSE, CAPILLARY: Glucose-Capillary: 95 mg/dL (ref 70–99)

## 2013-03-16 IMAGING — CT CT HEAD W/O CM
1 of 2 series · 15 of 30 positions shown, 19 images · non-contrast
Comparison: 03/26/2008

CLINICAL DATA: Fell from wheel chair striking right frontal region,
history organic brain syndrome

CT HEAD WITHOUT CONTRAST
TECHNIQUE: Contiguous axial images were obtained from the base of
the skull through the vertex without contrast.

[Series 3: headtrauma 2.4 h60s · axial · 0.45mm/px · z∈[+120,+255]mm · 15 of 60 slices shown, 19 images]
[im 4/60  brain]
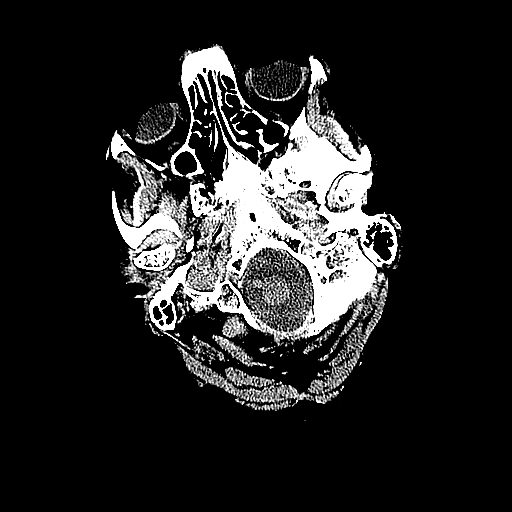
[im 4/60  bone]
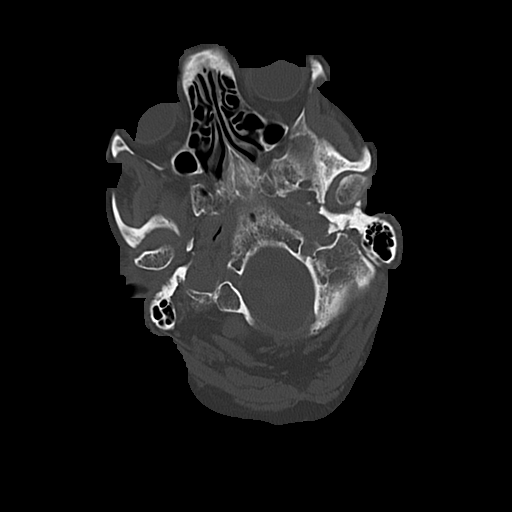
[im 7/60  brain]
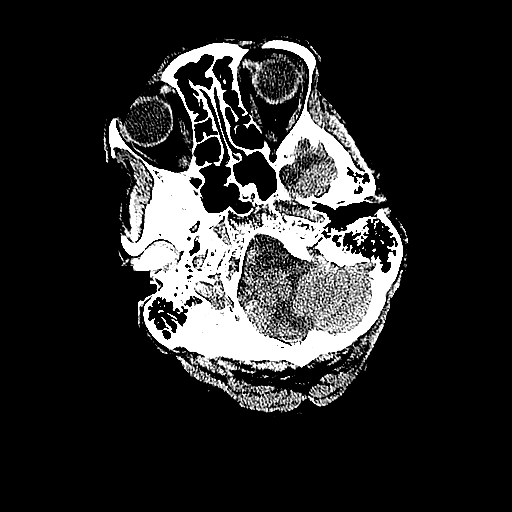
[im 13/60  brain]
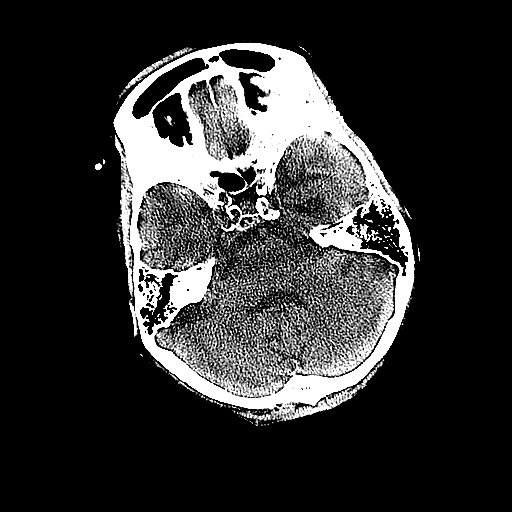
[im 16/60  brain]
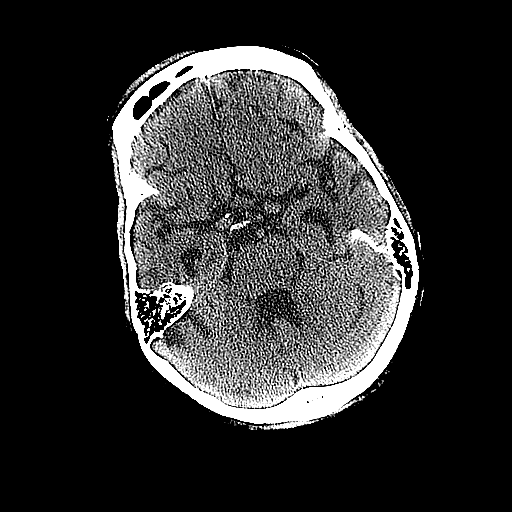
[im 19/60  brain]
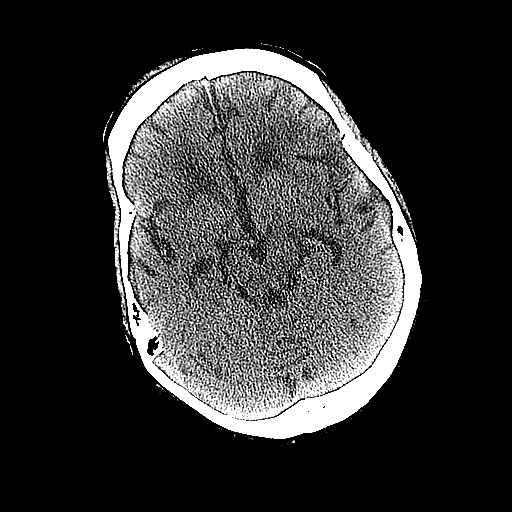
[im 19/60  bone]
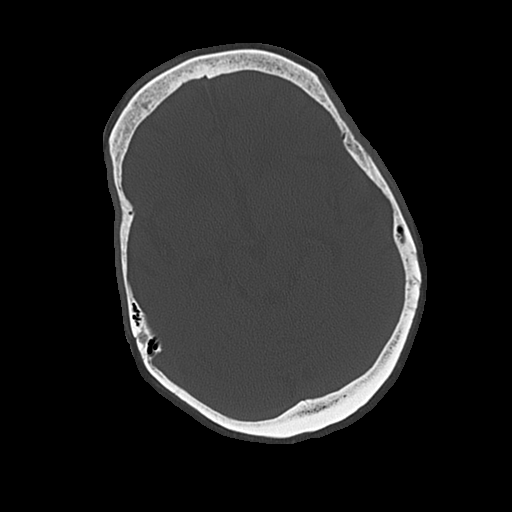
[im 22/60  brain]
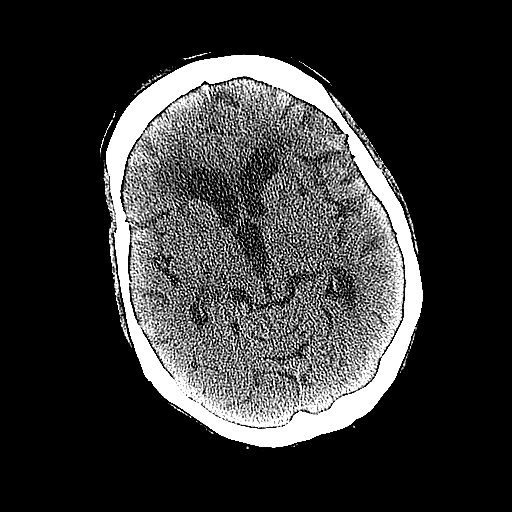
[im 25/60  brain]
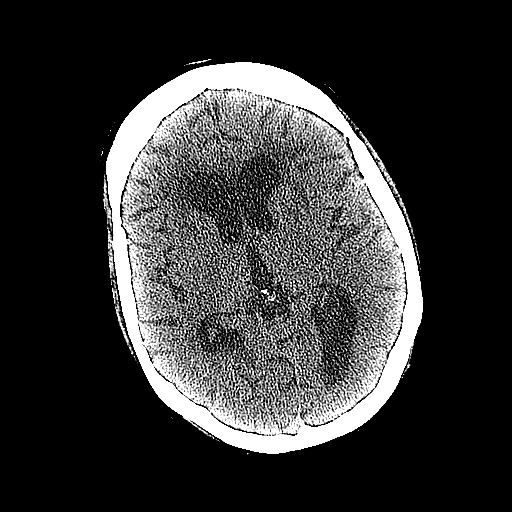
[im 32/60  brain]
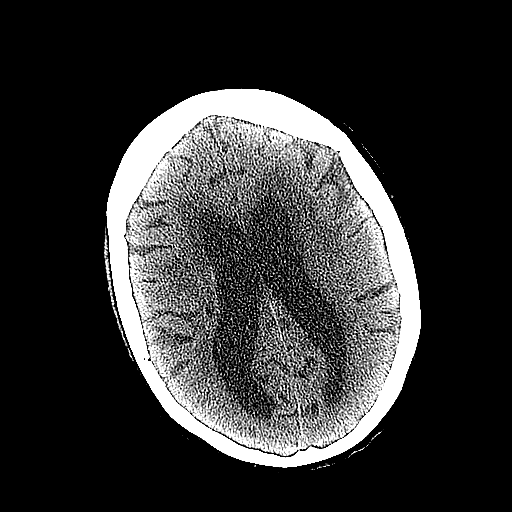
[im 35/60  brain]
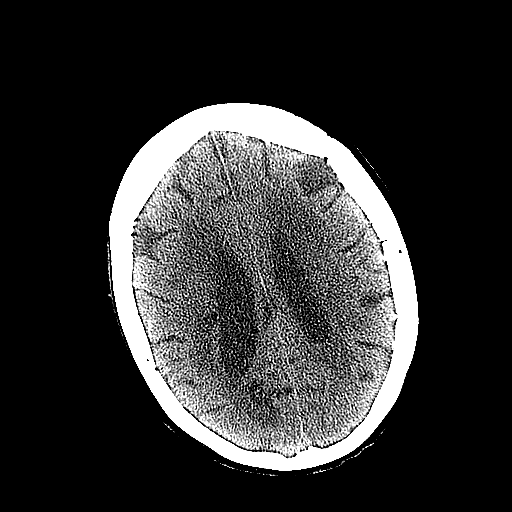
[im 35/60  bone]
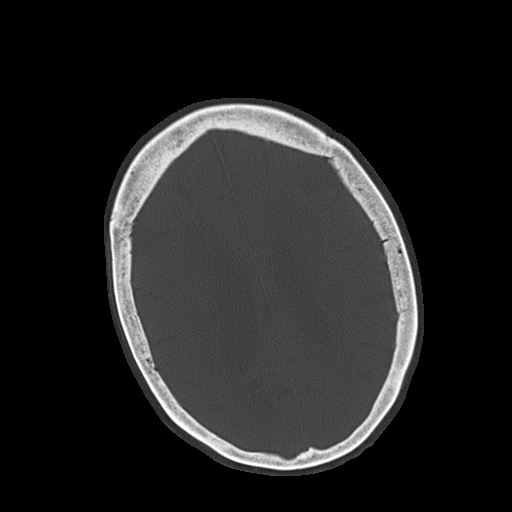
[im 38/60  brain]
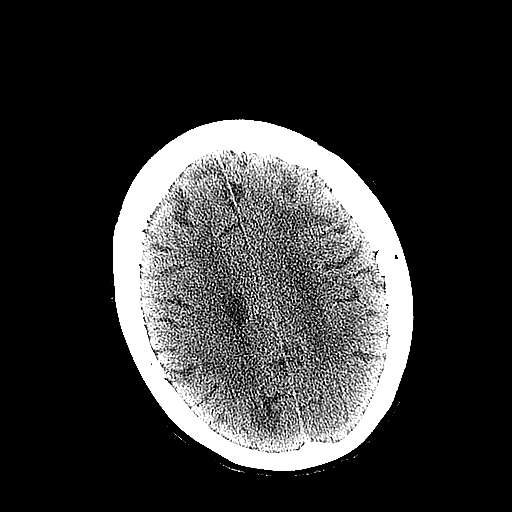
[im 41/60  brain]
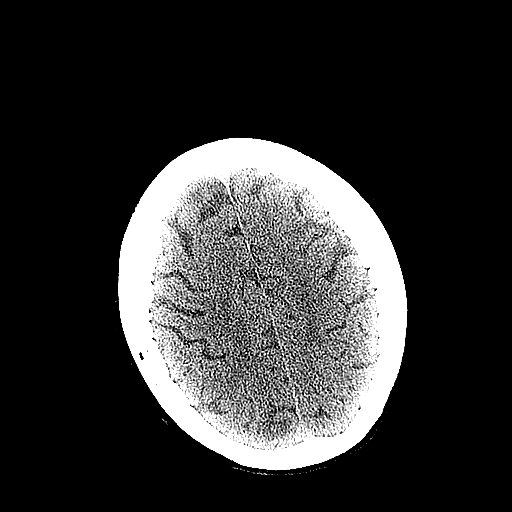
[im 44/60  brain]
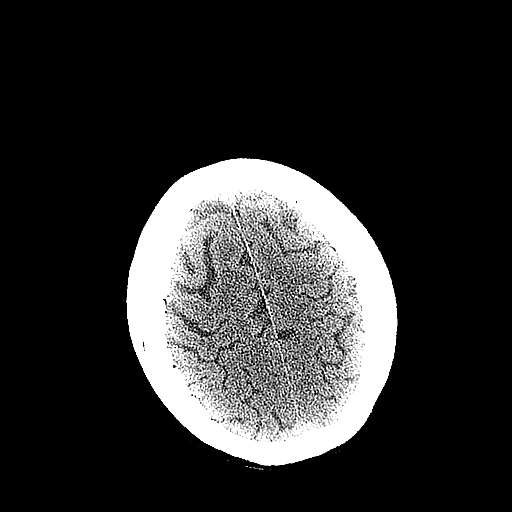
[im 50/60  brain]
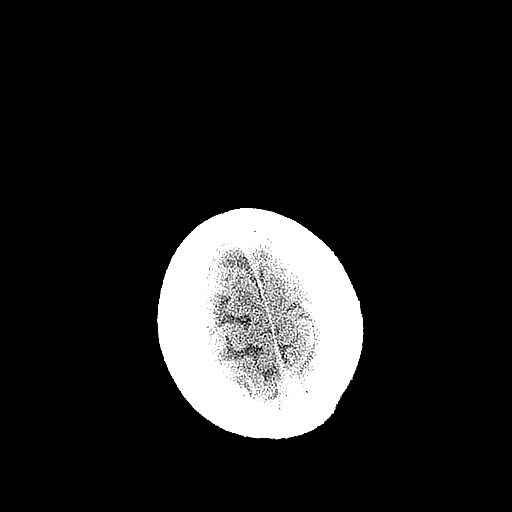
[im 50/60  bone]
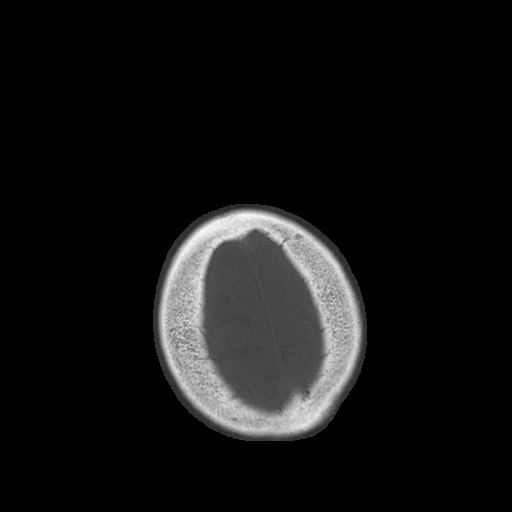
[im 53/60  brain]
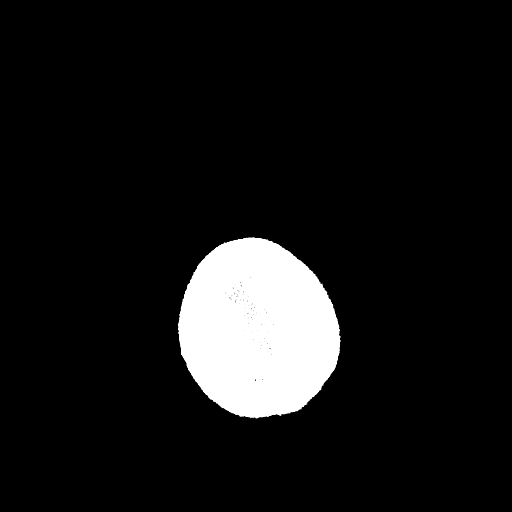
[im 56/60  brain]
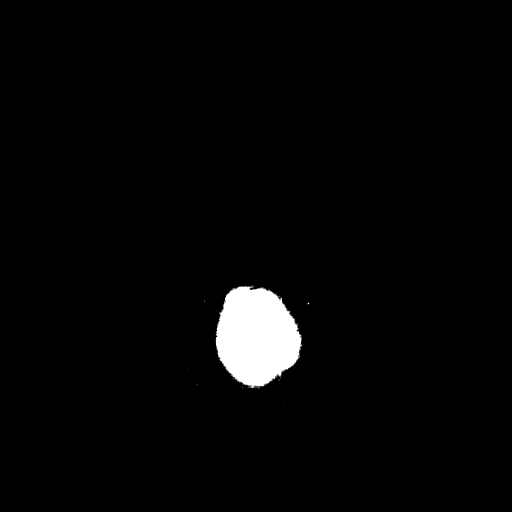

[15 of 30 positions shown; findings below may reference images not displayed]

FINDINGS: Generalized atrophy.
Normal ventricular morphology.
No midline shift or mass effect.
Extensive small vessel chronic ischemic changes of deep cerebral
white matter.
Old right parietal periventricular white matter infarct.
Old lacunar infarcts bilateral basal ganglia.
Small old high left frontal and posterior right parietal infarcts.
No intracranial hemorrhage, mass lesion, or evidence of acute
infarction.
No extra-axial fluid collections.
Right frontal scalp hematoma.
Atherosclerotic calcifications of internal carotid and vertebral
arteries bilaterally.
Visualized paranasal sinuses and mastoid air cells clear.
Skull intact.
IMPRESSION: Atrophy with small vessel chronic ischemic changes of deep cerebral
white matter.
Multiple old infarcts as above.
No definite acute intracranial abnormalities.

## 2013-03-18 LAB — GLUCOSE, CAPILLARY
Glucose-Capillary: 113 mg/dL — ABNORMAL HIGH (ref 70–99)
Glucose-Capillary: 89 mg/dL (ref 70–99)

## 2013-03-23 LAB — GLUCOSE, CAPILLARY

## 2013-03-25 LAB — GLUCOSE, CAPILLARY: Glucose-Capillary: 111 mg/dL — ABNORMAL HIGH (ref 70–99)

## 2013-03-26 LAB — GLUCOSE, CAPILLARY: Glucose-Capillary: 77 mg/dL (ref 70–99)

## 2013-03-28 LAB — GLUCOSE, CAPILLARY
Glucose-Capillary: 86 mg/dL (ref 70–99)
Glucose-Capillary: 172 mg/dL — ABNORMAL HIGH (ref 70–99)

## 2013-03-30 LAB — GLUCOSE, CAPILLARY

## 2013-04-01 LAB — GLUCOSE, CAPILLARY: Glucose-Capillary: 85 mg/dL (ref 70–99)

## 2013-04-02 LAB — GLUCOSE, CAPILLARY: Glucose-Capillary: 150 mg/dL — ABNORMAL HIGH (ref 70–99)

## 2013-04-04 ENCOUNTER — Other Ambulatory Visit: Payer: Self-pay | Admitting: *Deleted

## 2013-04-04 LAB — GLUCOSE, CAPILLARY
Glucose-Capillary: 125 mg/dL — ABNORMAL HIGH (ref 70–99)
Glucose-Capillary: 156 mg/dL — ABNORMAL HIGH (ref 70–99)

## 2013-04-04 MED ORDER — HYDROCODONE-ACETAMINOPHEN 5-325 MG PO TABS: ORAL_TABLET | ORAL | Status: DC

## 2013-04-06 LAB — GLUCOSE, CAPILLARY: Glucose-Capillary: 185 mg/dL — ABNORMAL HIGH (ref 70–99)

## 2013-04-08 LAB — GLUCOSE, CAPILLARY: Glucose-Capillary: 128 mg/dL — ABNORMAL HIGH (ref 70–99)

## 2013-04-11 LAB — GLUCOSE, CAPILLARY: Glucose-Capillary: 203 mg/dL — ABNORMAL HIGH (ref 70–99)

## 2013-04-13 LAB — GLUCOSE, CAPILLARY
Glucose-Capillary: 149 mg/dL — ABNORMAL HIGH (ref 70–99)
Glucose-Capillary: 102 mg/dL — ABNORMAL HIGH (ref 70–99)

## 2013-04-15 LAB — GLUCOSE, CAPILLARY: Glucose-Capillary: 119 mg/dL — ABNORMAL HIGH (ref 70–99)

## 2013-04-18 LAB — GLUCOSE, CAPILLARY: Glucose-Capillary: 148 mg/dL — ABNORMAL HIGH (ref 70–99)

## 2013-04-20 LAB — GLUCOSE, CAPILLARY: Glucose-Capillary: 99 mg/dL (ref 70–99)

## 2013-04-22 LAB — GLUCOSE, CAPILLARY
Glucose-Capillary: 127 mg/dL — ABNORMAL HIGH (ref 70–99)
Glucose-Capillary: 99 mg/dL (ref 70–99)

## 2013-04-27 LAB — GLUCOSE, CAPILLARY: Glucose-Capillary: 152 mg/dL — ABNORMAL HIGH (ref 70–99)

## 2013-04-29 LAB — GLUCOSE, CAPILLARY
Glucose-Capillary: 123 mg/dL — ABNORMAL HIGH (ref 70–99)
Glucose-Capillary: 118 mg/dL — ABNORMAL HIGH (ref 70–99)

## 2013-05-06 LAB — GLUCOSE, CAPILLARY: Glucose-Capillary: 148 mg/dL — ABNORMAL HIGH (ref 70–99)

## 2013-05-09 LAB — GLUCOSE, CAPILLARY
Glucose-Capillary: 110 mg/dL — ABNORMAL HIGH (ref 70–99)
Glucose-Capillary: 136 mg/dL — ABNORMAL HIGH (ref 70–99)

## 2013-05-11 LAB — GLUCOSE, CAPILLARY
Glucose-Capillary: 116 mg/dL — ABNORMAL HIGH (ref 70–99)
Glucose-Capillary: 69 mg/dL — ABNORMAL LOW (ref 70–99)

## 2013-05-16 LAB — GLUCOSE, CAPILLARY: Glucose-Capillary: 120 mg/dL — ABNORMAL HIGH (ref 70–99)

## 2013-05-20 LAB — GLUCOSE, CAPILLARY: Glucose-Capillary: 127 mg/dL — ABNORMAL HIGH (ref 70–99)

## 2013-05-23 LAB — GLUCOSE, CAPILLARY
Glucose-Capillary: 103 mg/dL — ABNORMAL HIGH (ref 70–99)
Glucose-Capillary: 127 mg/dL — ABNORMAL HIGH (ref 70–99)

## 2013-05-24 LAB — GLUCOSE, CAPILLARY

## 2013-05-25 LAB — GLUCOSE, CAPILLARY
Glucose-Capillary: 102 mg/dL — ABNORMAL HIGH (ref 70–99)
Glucose-Capillary: 105 mg/dL — ABNORMAL HIGH (ref 70–99)

## 2013-05-27 ENCOUNTER — Ambulatory Visit (HOSPITAL_COMMUNITY)
Admit: 2013-05-27 | Discharge: 2013-05-27 | Disposition: A | Discharge status: Home / Self Care | Payer: 59 | Attending: Internal Medicine | Admitting: Internal Medicine

## 2013-05-27 ENCOUNTER — Non-Acute Institutional Stay (SKILLED_NURSING_FACILITY): Payer: Medicare HMO | Admitting: Internal Medicine

## 2013-05-27 DIAGNOSIS — I10 Essential (primary) hypertension: Secondary | ICD-10-CM

## 2013-05-27 DIAGNOSIS — R0902 Hypoxemia: Secondary | ICD-10-CM | POA: Insufficient documentation

## 2013-05-27 DIAGNOSIS — I482 Chronic atrial fibrillation, unspecified: Secondary | ICD-10-CM

## 2013-05-27 DIAGNOSIS — I25810 Atherosclerosis of coronary artery bypass graft(s) without angina pectoris: Secondary | ICD-10-CM

## 2013-05-27 DIAGNOSIS — J449 Chronic obstructive pulmonary disease, unspecified: Secondary | ICD-10-CM

## 2013-05-27 DIAGNOSIS — F068 Other specified mental disorders due to known physiological condition: Secondary | ICD-10-CM

## 2013-05-27 DIAGNOSIS — J4489 Other specified chronic obstructive pulmonary disease: Secondary | ICD-10-CM

## 2013-05-27 DIAGNOSIS — F0390 Unspecified dementia without behavioral disturbance: Secondary | ICD-10-CM

## 2013-05-27 DIAGNOSIS — K219 Gastro-esophageal reflux disease without esophagitis: Secondary | ICD-10-CM

## 2013-05-27 DIAGNOSIS — E119 Type 2 diabetes mellitus without complications: Secondary | ICD-10-CM

## 2013-05-27 DIAGNOSIS — I4891 Unspecified atrial fibrillation: Secondary | ICD-10-CM

## 2013-05-27 LAB — GLUCOSE, CAPILLARY: Glucose-Capillary: 114 mg/dL — ABNORMAL HIGH (ref 70–99)

## 2013-05-28 NOTE — Progress Notes (Signed)
Patient ID: [name], female   DOB: [birth year], 79 y.o.   MRN: [id number]   This is a routine visit.  Level of care skilled.  Facility [facility]   Chief complaint-medical management of coronary artery disease -arrhythmia-diabetes type 2-dementia-GERD-renal insufficiency-hypertensionCOPD--acute visit secondary to hypoxia   History of present illness.  Patient is a long-standing resident of this facility with the above diagnoses she continues to be largely nonverbal and eating poorly at times although she has strong family support and they bring in food pretty much on a daily basis  Her weight continues to be stable however in the mid high 150's Most acute issue was this evening when she complained of shortness of breath to nursing tech who took her O2 saturation and found it to be 83%.  Nursing tech did raise the head of her bed somewhat and her O2 saturation rose to 88% and after oxygen was applied Rose  well into the 90s she did get a nebulizer treatment as well  does have a history COPD and has dol nebs scheduled every 6 hours when necessary.  We did order a chest x-ray and results are pending--  I did her  weanher off the oxygen and after 5-10 minutes her O2 saturation continued to be 95%-d we evaluated her after her chest x-ray and her O2 saturation continued to be stable at 95% she no longer is complaining of shortness of breath the initial wheezing I appreciated on exam resolved after the nebulizer treatment and her lungs sound at baseline  shallow air entry a few bronchial sounds but no wheezing         .  last year she was sent to the hospital with looking clammy she was on Cardizem and metoprolol her creatinine was 1.95 indicating some degree of dehydration.  Her Cardizem was discontinued does continue on Lopressor --he has tolerated this well.  is a type II diabetic on low-dose glipizide blood sugars appear satisfactory largely in the low 100s.  Earlier this year she did have a  couple syncopal episodes apparently this was related to having bowel movements-this may have been a vasovagal incident-there have been no recent recurrences to my knowledge  Blood pressures appear somewhat variable-- I see ranging from 138/59-154/62-in this range this evening was 155/68-generally in this range  In addition to Lopressor she is on low dose lisinopril.5 mg a day  .  Family medical social history reviewed per progress note of 07/13/2012 .  Medications have been reviewed per [place] Review of systems Limited secondary to dementia obtained from staff and patient.  In general no fever or chills noted.  Respiratory-as stated in history of present illness.--Initially complained of shortness of breath does not complaining of this now-and has not in the past 2 hours since I evaluated her initially Cardiac-no complaints of chest pain.  GI-no complaints of abdominal pain nausea vomiting diarrhea-some constipation at times apparently Colace is helping.  Neurologic history of severe dementia-no complaints of headache dizziness or numbness  .  Physical exam  Temperature 97.5 pulse 67 respirations 20 blood pressure 155/68. --Weight is stable at 159.8 .  In general this is a frail elderly female in no distress lying comfortably in bed.  Her skin is warm and dry.  Eyes-pupils appear equal round reactive to light sclera and conjunctiva are clear as visual acuity appears grossly intact.  Oropharynx clear mucous membranes moist.  Chest --initially on exam had some diffuse expiratory wheezing-on reevaluation after nebulizer treatment and also  after going for her chest x-ray this appears to have cleared there are some bronchial sounds no sign of distress certainly no labored breathing.  Heart-is regular rate and rhythm with somewhat distant heart sounds- continues with baseline I would say trace lower extremity edema  Abdomen is somewhat obese soft nontender with positive bowel sounds.  Muscle skeletal  Limited examination she is in bed but appears able to move all her extremities at baseline with lower extremity weakness-she does ambulate In wheelchair during the day.  Neurologic is grossly intact her speech is fairly minimal-cranial nerves appear grossly intact.  Psych she is oriented to self does follow simple verbal commands   Labs   04/20/2013.  The WBC 8.0 hemoglobin 9.8 platelets 218.  Sodium 137 potassium 5 BUN 32 creatinine 1.45 10/13/2012.  WBC 7.2 hemoglobin 11.5 platelets 243.  Sodium 138 potassium 4.6 BUN 34 creatinine 1.41  08/13/2012.  Liver function tests within normal limits except bilirubin 0.1 albumin of 2.9.  .  08/16/2012.  Sodium 139 potassium 4.7 BUN 31 creatinine 1.41.  08/13/2012  WBC 6.2 hemoglobin 10.7 platelets 229.  Liver function tests within normal limits except albumin of 2.9 bilirubin of 0.1   Assessment and plan  #1-hypoxia-this appears to been transitory may have been position related-with her head up higher appeared to improve she did areceive a short course of oxygen and nebulizer treatment and she appears to be at her baseline now-x-ray results are pending but I am encouraged that her subsequent exams appear to be fairly benign appearing.  #2-history of hypertension she continues on low dose lisinopril--as well as Lopressor--there she continues to have some elevated systolics at times-would be hesitant to increase the lisinopril secondary to her renal issues --consider low dose Norvasc...    #3-history renal insufficiency-creatinine of 1.45 recently appears to be baseline will update this  #4-diabetes type 2-this appears stable on low-dose glipizide. --her CBG's pretty consistently in low 100's 5-coronary artery disease-this has been asymptomatic is on aspirin.  #6-dementia-this appears to be quite significant -- she appears to be doing relatively well with supportive care-she is on Namenda  #7-GERD-this appears to be stable on Zantac.  #8  history of arrhythmia-this appears to be stable on her current medication--Lopressor andPropafena-  #9-constipation-she continues on Colace- no recent syncopal episodes to my knowledge she did have a couple earlier this year--this may have been vasovagal  #10-history of anemia-will update CBC  11  history weight loss-this appears to have stabilized she her family does bring in food and is very supportive she is also on ensure twice a day.  12-apparently some history of hypokalemia she is on low-dose potassium will update metabolic panel  Of note in regards to followup with hypoxia we have ordered a fairly aggressive followup with vital signs every 2 hours x2-then every 4 hours x2-then every shift she is a fragile individual she does continue with duo nebs every 6 hours when necessary  [id number]-of note more than 35 minutes spent initially assessing patient-then reassessing her x2 including several hours later in the evening--and coordinating formulating a plan of care for numerous diagnoses-   .

## 2013-05-29 ENCOUNTER — Non-Acute Institutional Stay (SKILLED_NURSING_FACILITY): Payer: Medicare HMO | Admitting: Internal Medicine

## 2013-05-29 DIAGNOSIS — R0902 Hypoxemia: Secondary | ICD-10-CM

## 2013-05-29 DIAGNOSIS — F068 Other specified mental disorders due to known physiological condition: Secondary | ICD-10-CM

## 2013-05-29 DIAGNOSIS — E119 Type 2 diabetes mellitus without complications: Secondary | ICD-10-CM

## 2013-05-29 DIAGNOSIS — R410 Disorientation, unspecified: Secondary | ICD-10-CM

## 2013-05-29 DIAGNOSIS — I10 Essential (primary) hypertension: Secondary | ICD-10-CM

## 2013-05-29 DIAGNOSIS — F0390 Unspecified dementia without behavioral disturbance: Secondary | ICD-10-CM

## 2013-05-29 DIAGNOSIS — F29 Unspecified psychosis not due to a substance or known physiological condition: Secondary | ICD-10-CM

## 2013-05-29 NOTE — Progress Notes (Signed)
Patient ID: [name], female   DOB: [birth year], 79 y.o.   MRN: [id number]  This is an acute visit.  Local care skilled.  Facility [facility].  Chief complaint-acute visit followup cocci -- confusion.  History of present illness.  Patient is an 79 year old female who I saw couple days ago for transitory hypoxia-this resolved when she was sitting up higher in bed-there was no reoccurrence-she does have a history of COPD she is on duo nebs when necessary and this appears to help this apparently has not been an issue since Friday--vital signs and O2 sats continued to be stable.  I do note she did have elevated systolics appears this persists I did consider starting her on Norvasc and after seeing the readings last couple days with systolics in the 150s to 170s will start that.  She continues on Lopressor 50 mg twice a day as well as lisinopril 5 mg a day with her history of some renal insufficiency hesitant to increase the lisinopril further she has had significantly elevated creatinines in the past  Another issue brought to my attention by family today was because she was having some mental status changes and confusion-apparently there've been a Christmas party in the facility and she thought she should be able to go home and celebrate Christmas-she was also asking about getting a ticket to [place]--.  She appeared to be more anxious past I further discussed this with nursing staff who states that they gave her her when necessary Ativan and apparently she was doing better less anxious past when I saw her this evening she appeared to be at baseline was answering fairly appropriately I asked her she was having a good day and she said not a good day not a bad day.  2 follow verbal instructions without difficulty-did not appear to be anxious.   family medical social history has been reviewed  Medications have been reviewed per [facility] of note she is on Namenda for fairly advanced dementia-also has the  Ativan when necessary as noted.  Review of systems somewhat limited since she is a poor historian but is not complaining of any chest pain shortness of breath headache or dizziness or dysuria-nursing staff has not noted this either.  Physical exam.  Temperature is 97.5 pulse 74 respirations 20 blood pressure 165/48-O2 saturation of been in the 90s.  General this is a frail elderly female in no distress.  Her skin is warm and dry.  Chest cannot really appreciate any wheezing or labored breathing on exam today no congestion noted reduced breath sounds which is not new.  Heart is distant heart sounds regular rate and rhythm with occasional irregular beat.  Abdomen is soft obese nontender positive bowel sounds.  GU cannot appreciate any suprapubic tenderness or CV tenderness   neurologic grossly intact her speech is clear from dinner.   she is ambulating at [place].  eline in her wheelchair actually has just returned  From dinner   Labs.  05/28/2013.  WBC 8.1 hemoglobin 11.1 platelets 250.  Sodium 137 potassium 4.9 BUN 25 creatinine 1.29.  Assessment and plan.  #1-hypoxia-chest x-ray was negative -- vital signs O2 sats are satisfactory suspect this was more positional related--continue to monitor she appears certainly at her baseline in this regard  #2-confusion-she appears to be at her baseline on exam tonight apparently there was some increased anxiety earlier today and confusione does have some baseline confusion with a history of dementia-I do not see a great difference this evening-I discussed  this with nursing who is quite familiar with her-at this point will monitor she does not appear to exhibit signs of a UTI but certainly would have a low threshold for investigating this if confusion returns or symptoms increase--her lab work just obtained appears to be unremarkable.  #3-hypertension-I do note continued elevated systolics Will start Norvasc2. 5 mg a day and monitor she is  on Lopressor and lisinopril as well.--Check blood pressures every shift with a log for review next week  #4-history of diabetes-her sugars are quite stable--in the low 100s consistently-- she is on low-dose glipizide Will reduce these to CBGs 3 times every morning she has been getting 3 days a week twice a day  . [id number]  \ [id number]  \

## 2013-05-30 LAB — GLUCOSE, CAPILLARY: Glucose-Capillary: 119 mg/dL — ABNORMAL HIGH (ref 70–99)

## 2013-05-31 LAB — GLUCOSE, CAPILLARY
Glucose-Capillary: 161 mg/dL — ABNORMAL HIGH (ref 70–99)
Glucose-Capillary: 225 mg/dL — ABNORMAL HIGH (ref 70–99)

## 2013-06-01 LAB — GLUCOSE, CAPILLARY: Glucose-Capillary: 116 mg/dL — ABNORMAL HIGH (ref 70–99)

## 2013-06-03 LAB — GLUCOSE, CAPILLARY: Glucose-Capillary: 109 mg/dL — ABNORMAL HIGH (ref 70–99)

## 2013-06-06 LAB — GLUCOSE, CAPILLARY: Glucose-Capillary: 107 mg/dL — ABNORMAL HIGH (ref 70–99)

## 2013-06-08 LAB — GLUCOSE, CAPILLARY: Glucose-Capillary: 126 mg/dL — ABNORMAL HIGH (ref 70–99)

## 2013-06-10 LAB — GLUCOSE, CAPILLARY: Glucose-Capillary: 143 mg/dL — ABNORMAL HIGH (ref 70–99)

## 2013-06-13 LAB — GLUCOSE, CAPILLARY: Glucose-Capillary: 126 mg/dL — ABNORMAL HIGH (ref 70–99)

## 2013-06-15 LAB — GLUCOSE, CAPILLARY: GLUCOSE-CAPILLARY: 131 mg/dL — AB (ref 70–99)

## 2013-06-16 ENCOUNTER — Inpatient Hospital Stay
Admission: RE | Admit: 2013-06-16 | Discharge: 2013-12-20 | Disposition: A | Discharge status: Nursing Home (Includes ICF) | Payer: 59 | Source: Ambulatory Visit | Attending: Internal Medicine | Admitting: Internal Medicine

## 2013-06-29 LAB — GLUCOSE, CAPILLARY: GLUCOSE-CAPILLARY: 139 mg/dL — AB (ref 70–99)

## 2013-07-14 LAB — GLUCOSE, CAPILLARY: GLUCOSE-CAPILLARY: 128 mg/dL — AB (ref 70–99)

## 2013-07-15 LAB — GLUCOSE, CAPILLARY: GLUCOSE-CAPILLARY: 129 mg/dL — AB (ref 70–99)

## 2013-07-18 LAB — GLUCOSE, CAPILLARY: GLUCOSE-CAPILLARY: 113 mg/dL — AB (ref 70–99)

## 2013-07-20 LAB — GLUCOSE, CAPILLARY: Glucose-Capillary: 91 mg/dL (ref 70–99)

## 2013-07-22 LAB — GLUCOSE, CAPILLARY: Glucose-Capillary: 120 mg/dL — ABNORMAL HIGH (ref 70–99)

## 2013-07-25 LAB — GLUCOSE, CAPILLARY: Glucose-Capillary: 165 mg/dL — ABNORMAL HIGH (ref 70–99)

## 2013-07-26 ENCOUNTER — Other Ambulatory Visit: Payer: Self-pay | Admitting: *Deleted

## 2013-07-26 MED ORDER — HYDROCODONE-ACETAMINOPHEN 5-325 MG PO TABS: ORAL_TABLET | ORAL | Status: DC

## 2013-07-26 MED ORDER — LORAZEPAM 0.5 MG PO TABS: ORAL_TABLET | ORAL | Status: DC

## 2013-07-26 NOTE — Telephone Encounter (Signed)
[facility] 

## 2013-07-27 LAB — GLUCOSE, CAPILLARY: GLUCOSE-CAPILLARY: 109 mg/dL — AB (ref 70–99)

## 2013-07-29 LAB — GLUCOSE, CAPILLARY: Glucose-Capillary: 126 mg/dL — ABNORMAL HIGH (ref 70–99)

## 2013-08-01 LAB — GLUCOSE, CAPILLARY: GLUCOSE-CAPILLARY: 99 mg/dL (ref 70–99)

## 2013-08-03 LAB — GLUCOSE, CAPILLARY: Glucose-Capillary: 137 mg/dL — ABNORMAL HIGH (ref 70–99)

## 2013-08-05 LAB — GLUCOSE, CAPILLARY: Glucose-Capillary: 133 mg/dL — ABNORMAL HIGH (ref 70–99)

## 2013-08-08 LAB — GLUCOSE, CAPILLARY: Glucose-Capillary: 117 mg/dL — ABNORMAL HIGH (ref 70–99)

## 2013-08-10 LAB — GLUCOSE, CAPILLARY: GLUCOSE-CAPILLARY: 118 mg/dL — AB (ref 70–99)

## 2013-08-12 LAB — GLUCOSE, CAPILLARY: Glucose-Capillary: 115 mg/dL — ABNORMAL HIGH (ref 70–99)

## 2013-08-15 LAB — GLUCOSE, CAPILLARY: Glucose-Capillary: 120 mg/dL — ABNORMAL HIGH (ref 70–99)

## 2013-08-17 LAB — GLUCOSE, CAPILLARY: Glucose-Capillary: 115 mg/dL — ABNORMAL HIGH (ref 70–99)

## 2013-08-19 LAB — GLUCOSE, CAPILLARY: Glucose-Capillary: 108 mg/dL — ABNORMAL HIGH (ref 70–99)

## 2013-08-22 LAB — GLUCOSE, CAPILLARY: GLUCOSE-CAPILLARY: 132 mg/dL — AB (ref 70–99)

## 2013-08-23 ENCOUNTER — Encounter: Payer: Self-pay | Admitting: *Deleted

## 2013-08-23 ENCOUNTER — Other Ambulatory Visit: Payer: Self-pay | Admitting: *Deleted

## 2013-08-23 IMAGING — CR DG HIP (WITH OR WITHOUT PELVIS) 2-3V*L*
3 series · 3 of 3 positions shown · non-contrast
Comparison: 01/25/2011 [HOSPITAL].

CLINICAL DATA: Post left hip replacement.

LEFT HIP - COMPLETE 2+ VIEW

[view not recorded (1 of 3)]
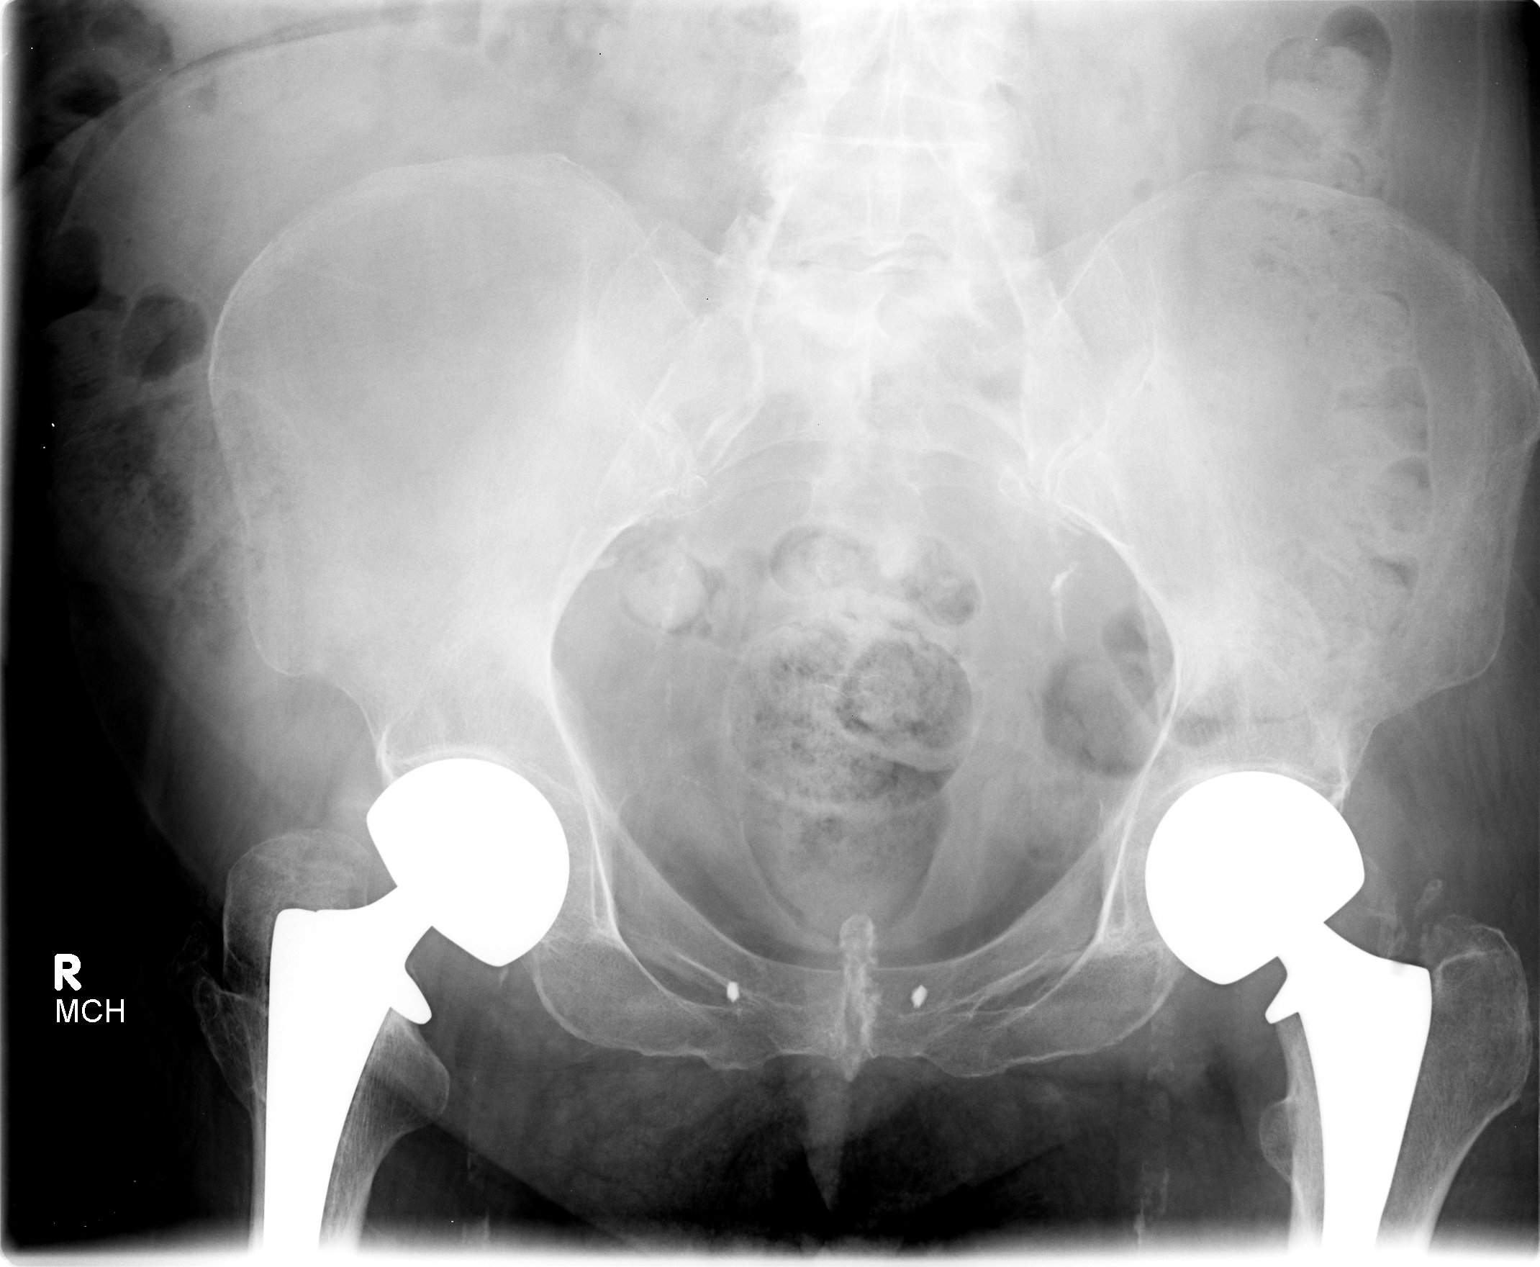

[view not recorded (2 of 3)]
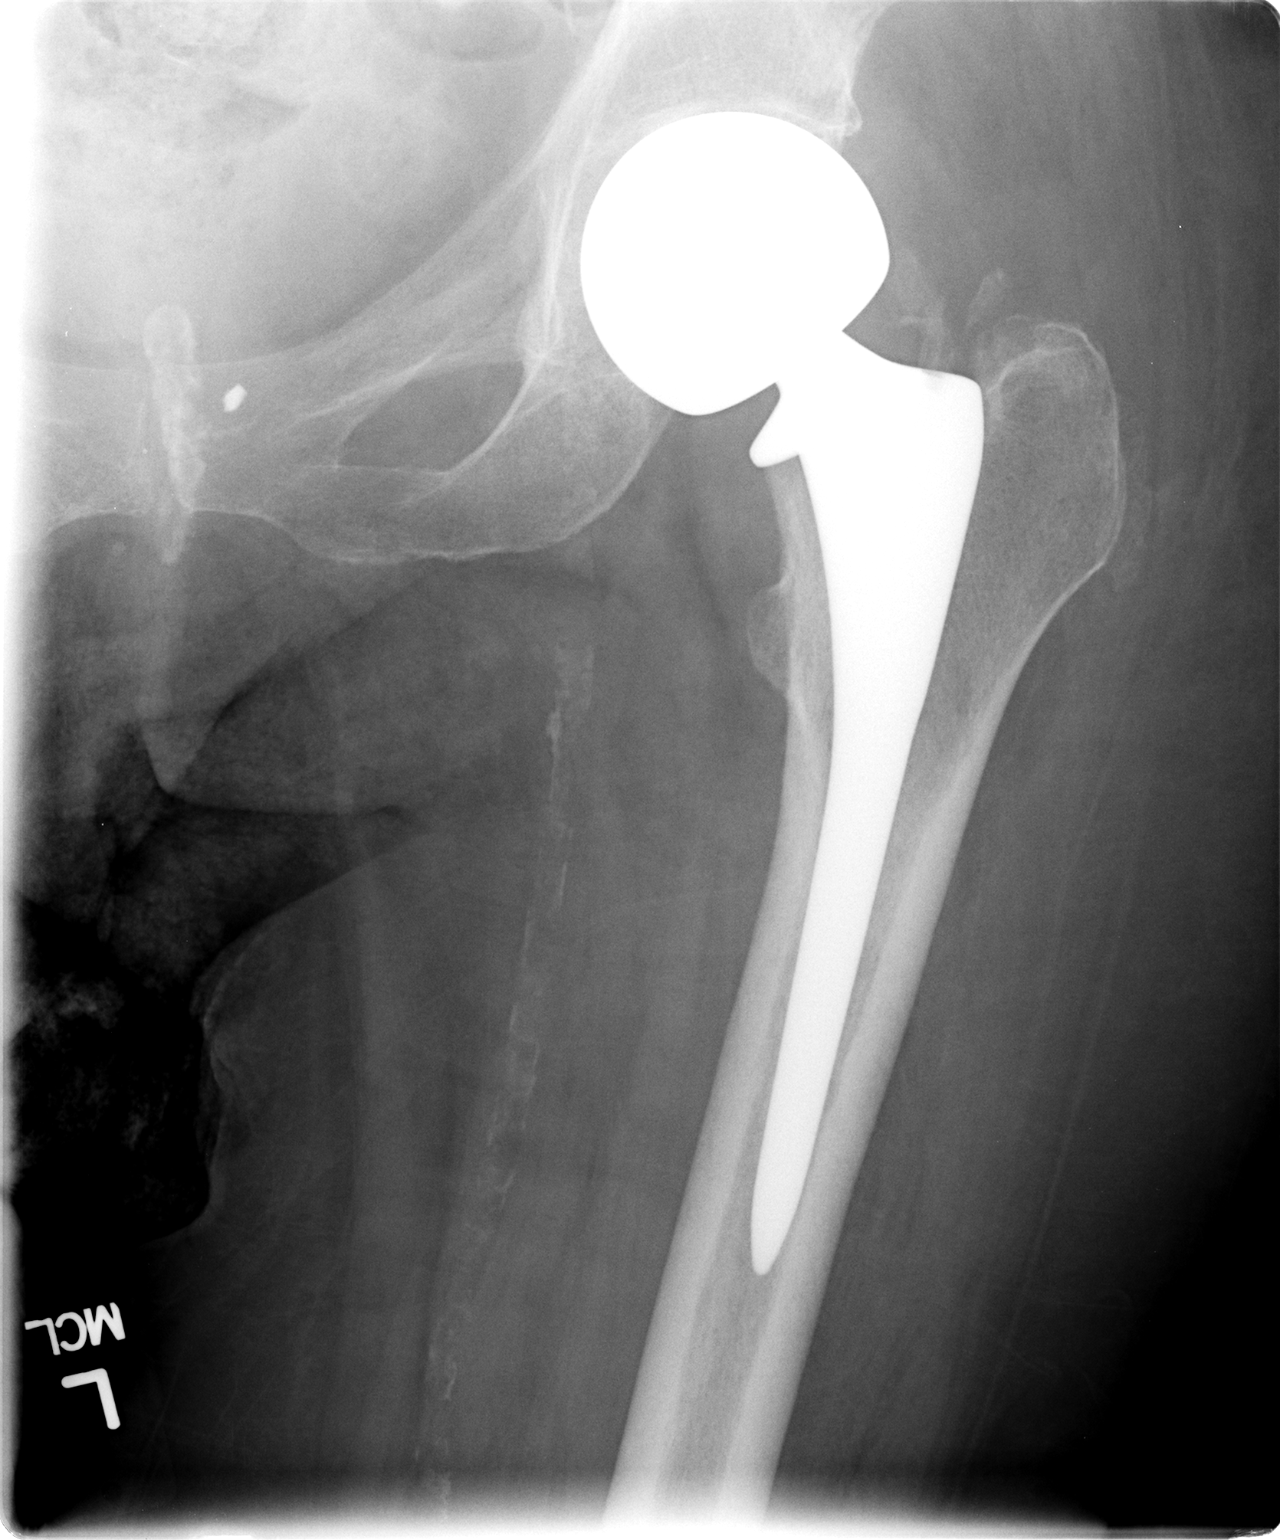

[view not recorded (3 of 3)]
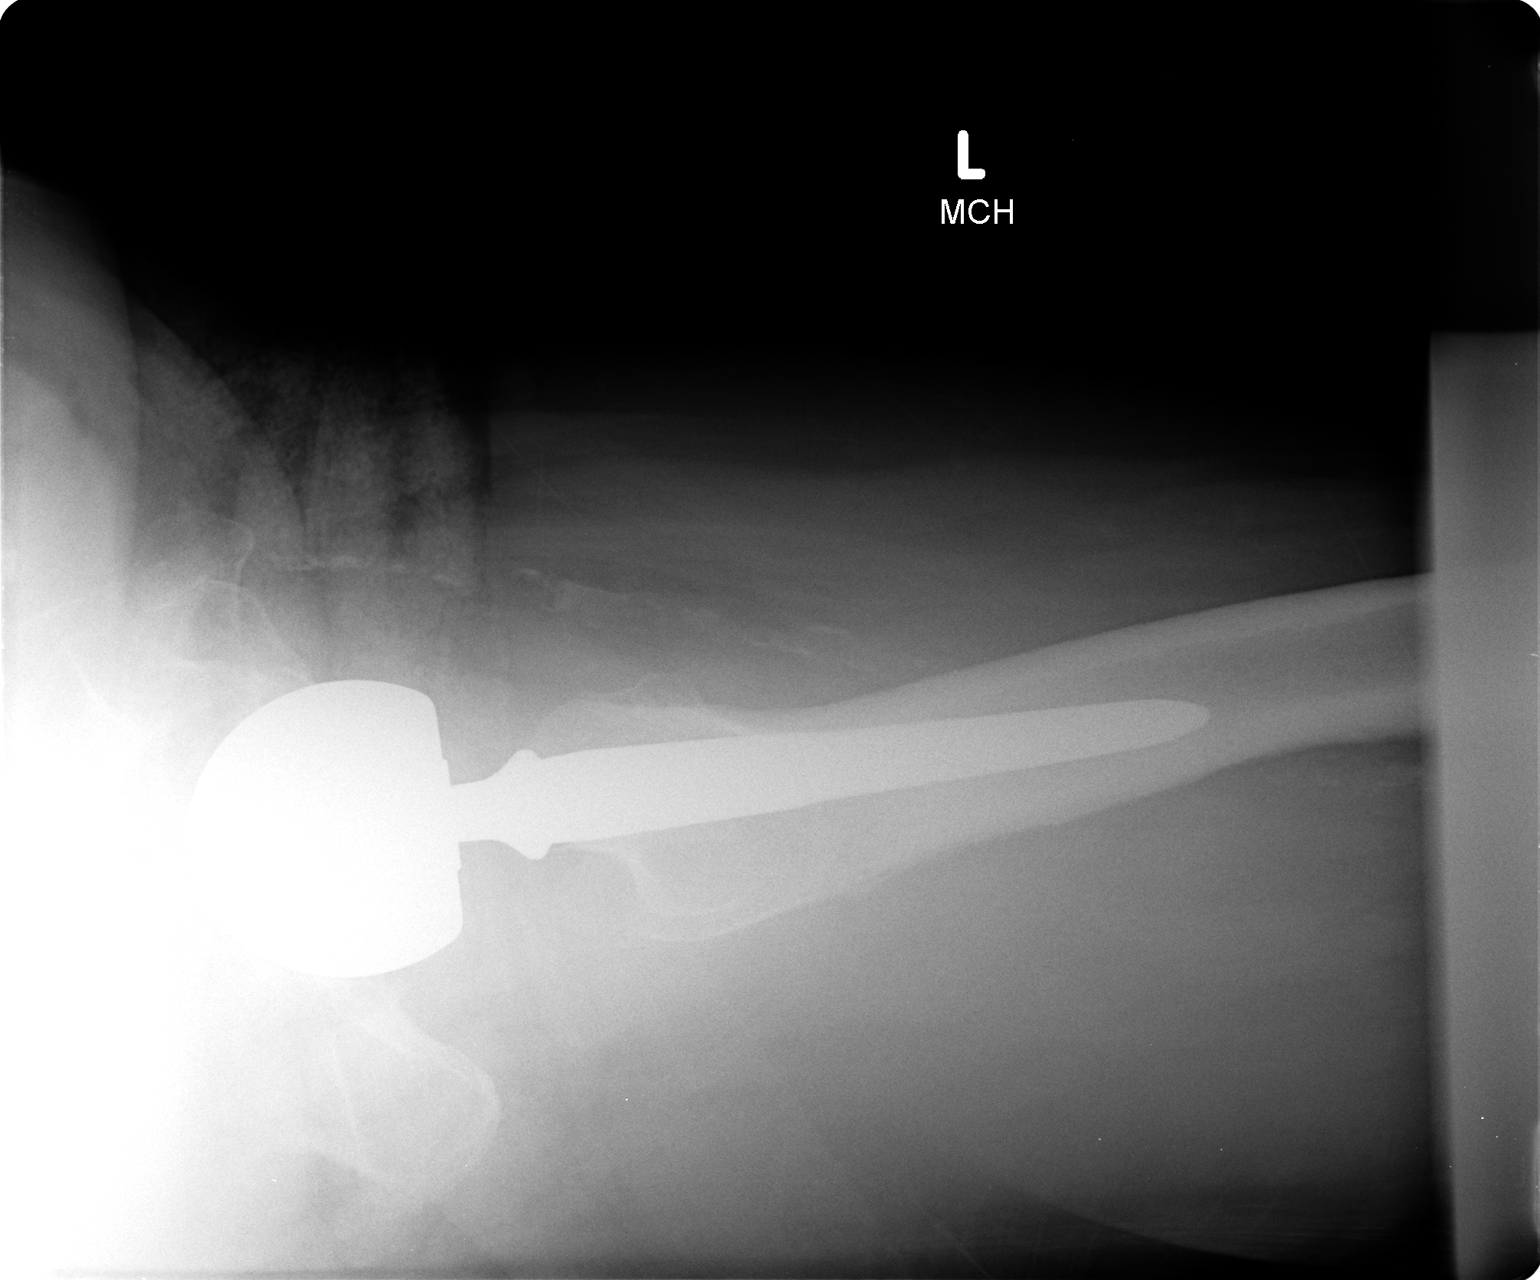

[3 of 3 positions shown; findings below may reference images not displayed]

FINDINGS: Left total hip replacement appears in satisfactory
position without surrounding complication.

Limited imaging right hip replacement unremarkable.

Prior surgery pubic body region. Prominent calcification and pubic
symphysis level.

Vascular calcifications.
IMPRESSION: Post left hip replacement without complication identified.

## 2013-08-23 MED ORDER — HYDROCODONE-ACETAMINOPHEN 5-325 MG PO TABS: ORAL_TABLET | ORAL | Status: DC

## 2013-08-23 NOTE — Telephone Encounter (Signed)
[facility] 

## 2013-08-24 LAB — GLUCOSE, CAPILLARY: GLUCOSE-CAPILLARY: 101 mg/dL — AB (ref 70–99)

## 2013-08-26 LAB — GLUCOSE, CAPILLARY: Glucose-Capillary: 146 mg/dL — ABNORMAL HIGH (ref 70–99)

## 2013-08-28 ENCOUNTER — Encounter: Payer: Self-pay | Admitting: Internal Medicine

## 2013-08-28 ENCOUNTER — Non-Acute Institutional Stay (SKILLED_NURSING_FACILITY): Payer: Medicare HMO | Admitting: Internal Medicine

## 2013-08-28 DIAGNOSIS — N390 Urinary tract infection, site not specified: Secondary | ICD-10-CM

## 2013-08-28 DIAGNOSIS — J449 Chronic obstructive pulmonary disease, unspecified: Secondary | ICD-10-CM

## 2013-08-28 DIAGNOSIS — I482 Chronic atrial fibrillation, unspecified: Secondary | ICD-10-CM

## 2013-08-28 DIAGNOSIS — I4891 Unspecified atrial fibrillation: Secondary | ICD-10-CM

## 2013-08-28 DIAGNOSIS — F0390 Unspecified dementia without behavioral disturbance: Secondary | ICD-10-CM

## 2013-08-28 DIAGNOSIS — I10 Essential (primary) hypertension: Secondary | ICD-10-CM

## 2013-08-28 DIAGNOSIS — I25810 Atherosclerosis of coronary artery bypass graft(s) without angina pectoris: Secondary | ICD-10-CM

## 2013-08-28 DIAGNOSIS — E119 Type 2 diabetes mellitus without complications: Secondary | ICD-10-CM

## 2013-08-28 DIAGNOSIS — F03C0 Unspecified dementia, severe, without behavioral disturbance, psychotic disturbance, mood disturbance, and anxiety: Secondary | ICD-10-CM

## 2013-08-28 DIAGNOSIS — G9341 Metabolic encephalopathy: Secondary | ICD-10-CM

## 2013-08-28 DIAGNOSIS — K219 Gastro-esophageal reflux disease without esophagitis: Secondary | ICD-10-CM

## 2013-08-28 NOTE — Progress Notes (Signed)
Patient ID: [name], female   DOB: [birth year], 80 y.o.   MRN: [id number]   This is a routine visit.  Level of care skilled.  Facility [facility]   Chief complaint-medical management of coronary artery disease -arrhythmia-diabetes type 2-dementia-GERD-renal insufficiency-hypertensionCOPD--acute visit followup UTI  History of present illness.   Patient is a long-standing resident of this facility with the above diagnoses she continues to be largely nonverbal and eating poorly at times although she has strong family support and they bring in food pretty much on a daily basis  Her weight continues to be stable however in the mid high 150's She did have some recent mental status changes according to family and a urine culture did grow out 2 organisms Klebsiella as well as Proteus-both were sensitive to Rocephin which she is completing a one-week course-she has been afebrile and per nursing staff appears to be essentially at her baseline    .  Over a year ago she was sent to the hospital with looking clammy she was on Cardizem and metoprolol her creatinine was 1.95 indicating some degree of dehydration.  Her Cardizem was discontinued does continue on Lopressor --he has tolerated this well.  is a type II diabetic on low-dose glipizide blood sugars appear satisfactory largely in the low 100s. Last year she did have a couple syncopal episodes apparently this was related to having bowel movements-this may have been a vasovagal incident-there have been no recent recurrences to my knowledge  Blood pressures appear to be stable most recently 117/69-142/70--103/57-122/52-we did restart her Norvasc recently secondary to elevated readings at times   In addition to Lopressor   and Norvasc she is on low dose lisinopril.5 mg a day  .  Family medical social history reviewed per progress note of 07/13/2012  .  Medications have been reviewed per [facility]  Review of systems Limited secondary to dementia obtained from  staff and patient.  In general no fever or chills noted.  Respiratory  no-recent complaints of shortness of breath  Cardiac-no complaints of chest pain.  GI-no complaints of abdominal pain nausea vomiting diarrhea-some constipation at times apparently Colace is helping.  Neurologic history of severe dementia-no complaints of headache dizziness or numbness  .  Physical exam  Temperature 98.1 pulse 60 respirations 20 blood pressure 117/69 weight is stable at 154.4  .  In general this is a frail elderly female in no distress lying comfortably in bed.  Her skin is warm and dry.  Eyes-pupils appear equal round reactive to light sclera and conjunctiva are clear as visual acuity appears grossly intact.  Oropharynx clear mucous membranes moist.  Chest -- Clear to  auscultation with shallow air entry a minimal wheeze at the bases but this is quite minimal  Heart-is regular rate and rhythm with somewhat distant heart sounds- continues with baseline I would say trace lower extremity edema  Abdomen is somewhat obese soft nontender with positive bowel sounds.  Muscle skeletal Limited examination she is in bed but appears able to move all her extremities at baseline with lower extremity weakness-she does ambulate In wheelchair during the day.  Neurologic is grossly intact her speech is fairly minimal-cranial nerves appear grossly intact.  Psych she is oriented to self does follow simple verbal commands    Labs   07/28/2013.  Hemoglobin A1c 6.6.  Vitamin D level XLVII.  WBC 7.6 hemoglobin 10.6 platelets 222.  Sodium 141 potassium 4.7 BUN 27 creatinine 1.36.   04/20/2013.  The WBC 8.0 hemoglobin  9.8 platelets 218.  Sodium 137 potassium 5 BUN 32 creatinine 1.45  10/13/2012.  WBC 7.2 hemoglobin 11.5 platelets 243.  Sodium 138 potassium 4.6 BUN 34 creatinine 1.41  08/13/2012.  Liver function tests within normal limits except bilirubin 0.1 albumin of 2.9.  .  08/16/2012.  Sodium 139  potassium 4.7 BUN 31 creatinine 1.41.  08/13/2012  WBC 6.2 hemoglobin 10.7 platelets 229.  Liver function tests within normal limits except albumin of 2.9 bilirubin of 0.1   Assessment and plan  #1- UTI-this appears to be stable she is completing a course of Rocephin she is afebrile appears to be relatively at baseline  #2-history of hypertension this appears to have stabilized she is on Lopressor as well as lisinopril and Norvasc ...  #3-history renal insufficiency-creatinine of 1.36 recently appears to be baseline will update this  #4-diabetes type 2-this appears stable on low-dose glipizide. --her CBG's pretty consistently in low 100's --hemoglobin A1c of 6.6 last month is satisfactory 5-coronary artery disease-this has been asymptomatic is on aspirin.  #6-dementia-this appears to be quite significant -- she appears to be doing relatively well with supportive care-she is on Namenda  #7-GERD-this appears to be stable on Zantac.  #8 history of arrhythmia-this appears to be stable on her current medication--Lopressor andPropafena-  #9-constipation-she continues on Colace- no recent syncopal episodes to my knowledge she did have a couple earlier this year--this may have been vasovagal  #10-history of anemia-recent CBC shows stability 11  history weight loss-this appears to have stabilized she her family does bring in food and is very supportive she is also on ensure twice a day.  12-apparently some history of hypokalemia she is on low-dose potassium will update metabolic panel     [phone] -  .

## 2013-08-29 LAB — GLUCOSE, CAPILLARY: Glucose-Capillary: 116 mg/dL — ABNORMAL HIGH (ref 70–99)

## 2013-08-31 LAB — GLUCOSE, CAPILLARY: Glucose-Capillary: 126 mg/dL — ABNORMAL HIGH (ref 70–99)

## 2013-09-02 LAB — GLUCOSE, CAPILLARY: Glucose-Capillary: 108 mg/dL — ABNORMAL HIGH (ref 70–99)

## 2013-09-05 LAB — GLUCOSE, CAPILLARY: Glucose-Capillary: 120 mg/dL — ABNORMAL HIGH (ref 70–99)

## 2013-09-07 LAB — GLUCOSE, CAPILLARY: GLUCOSE-CAPILLARY: 123 mg/dL — AB (ref 70–99)

## 2013-09-09 LAB — GLUCOSE, CAPILLARY: GLUCOSE-CAPILLARY: 112 mg/dL — AB (ref 70–99)

## 2013-09-12 LAB — GLUCOSE, CAPILLARY: GLUCOSE-CAPILLARY: 125 mg/dL — AB (ref 70–99)

## 2013-09-14 LAB — GLUCOSE, CAPILLARY: GLUCOSE-CAPILLARY: 134 mg/dL — AB (ref 70–99)

## 2013-09-16 LAB — GLUCOSE, CAPILLARY: GLUCOSE-CAPILLARY: 138 mg/dL — AB (ref 70–99)

## 2013-09-19 LAB — GLUCOSE, CAPILLARY: GLUCOSE-CAPILLARY: 131 mg/dL — AB (ref 70–99)

## 2013-09-21 LAB — GLUCOSE, CAPILLARY: Glucose-Capillary: 126 mg/dL — ABNORMAL HIGH (ref 70–99)

## 2013-09-23 LAB — GLUCOSE, CAPILLARY: Glucose-Capillary: 114 mg/dL — ABNORMAL HIGH (ref 70–99)

## 2013-09-26 LAB — GLUCOSE, CAPILLARY: Glucose-Capillary: 147 mg/dL — ABNORMAL HIGH (ref 70–99)

## 2013-09-28 LAB — GLUCOSE, CAPILLARY: GLUCOSE-CAPILLARY: 124 mg/dL — AB (ref 70–99)

## 2013-09-30 LAB — GLUCOSE, CAPILLARY: GLUCOSE-CAPILLARY: 113 mg/dL — AB (ref 70–99)

## 2013-10-03 LAB — GLUCOSE, CAPILLARY: GLUCOSE-CAPILLARY: 104 mg/dL — AB (ref 70–99)

## 2013-10-05 LAB — GLUCOSE, CAPILLARY: GLUCOSE-CAPILLARY: 107 mg/dL — AB (ref 70–99)

## 2013-10-07 LAB — GLUCOSE, CAPILLARY: GLUCOSE-CAPILLARY: 101 mg/dL — AB (ref 70–99)

## 2013-10-10 LAB — GLUCOSE, CAPILLARY: Glucose-Capillary: 138 mg/dL — ABNORMAL HIGH (ref 70–99)

## 2013-10-12 LAB — GLUCOSE, CAPILLARY: Glucose-Capillary: 127 mg/dL — ABNORMAL HIGH (ref 70–99)

## 2013-10-14 LAB — GLUCOSE, CAPILLARY: Glucose-Capillary: 101 mg/dL — ABNORMAL HIGH (ref 70–99)

## 2013-10-17 LAB — GLUCOSE, CAPILLARY: Glucose-Capillary: 120 mg/dL — ABNORMAL HIGH (ref 70–99)

## 2013-10-19 LAB — GLUCOSE, CAPILLARY: Glucose-Capillary: 97 mg/dL (ref 70–99)

## 2013-10-21 LAB — GLUCOSE, CAPILLARY: GLUCOSE-CAPILLARY: 129 mg/dL — AB (ref 70–99)

## 2013-10-24 ENCOUNTER — Other Ambulatory Visit: Payer: Self-pay | Admitting: *Deleted

## 2013-10-24 LAB — GLUCOSE, CAPILLARY: Glucose-Capillary: 120 mg/dL — ABNORMAL HIGH (ref 70–99)

## 2013-10-24 MED ORDER — LORAZEPAM 0.5 MG PO TABS: ORAL_TABLET | ORAL | Status: DC

## 2013-10-24 NOTE — Telephone Encounter (Signed)
[facility] 

## 2013-10-26 LAB — GLUCOSE, CAPILLARY: Glucose-Capillary: 102 mg/dL — ABNORMAL HIGH (ref 70–99)

## 2013-10-28 LAB — GLUCOSE, CAPILLARY: GLUCOSE-CAPILLARY: 140 mg/dL — AB (ref 70–99)

## 2013-10-31 LAB — GLUCOSE, CAPILLARY: Glucose-Capillary: 115 mg/dL — ABNORMAL HIGH (ref 70–99)

## 2013-11-02 LAB — GLUCOSE, CAPILLARY: Glucose-Capillary: 120 mg/dL — ABNORMAL HIGH (ref 70–99)

## 2013-11-04 LAB — GLUCOSE, CAPILLARY: Glucose-Capillary: 104 mg/dL — ABNORMAL HIGH (ref 70–99)

## 2013-11-07 LAB — GLUCOSE, CAPILLARY: GLUCOSE-CAPILLARY: 129 mg/dL — AB (ref 70–99)

## 2013-11-09 LAB — GLUCOSE, CAPILLARY: Glucose-Capillary: 117 mg/dL — ABNORMAL HIGH (ref 70–99)

## 2013-11-11 LAB — GLUCOSE, CAPILLARY: GLUCOSE-CAPILLARY: 114 mg/dL — AB (ref 70–99)

## 2013-11-13 LAB — GLUCOSE, CAPILLARY: GLUCOSE-CAPILLARY: 484 mg/dL — AB (ref 70–99)

## 2013-11-14 LAB — GLUCOSE, CAPILLARY: GLUCOSE-CAPILLARY: 110 mg/dL — AB (ref 70–99)

## 2013-11-16 LAB — GLUCOSE, CAPILLARY: Glucose-Capillary: 117 mg/dL — ABNORMAL HIGH (ref 70–99)

## 2013-11-18 LAB — GLUCOSE, CAPILLARY: Glucose-Capillary: 122 mg/dL — ABNORMAL HIGH (ref 70–99)

## 2013-11-21 LAB — GLUCOSE, CAPILLARY: Glucose-Capillary: 110 mg/dL — ABNORMAL HIGH (ref 70–99)

## 2013-11-25 LAB — GLUCOSE, CAPILLARY: GLUCOSE-CAPILLARY: 134 mg/dL — AB (ref 70–99)

## 2013-11-30 LAB — GLUCOSE, CAPILLARY: Glucose-Capillary: 130 mg/dL — ABNORMAL HIGH (ref 70–99)

## 2013-12-02 LAB — GLUCOSE, CAPILLARY: Glucose-Capillary: 113 mg/dL — ABNORMAL HIGH (ref 70–99)

## 2013-12-05 LAB — GLUCOSE, CAPILLARY: Glucose-Capillary: 114 mg/dL — ABNORMAL HIGH (ref 70–99)

## 2013-12-07 LAB — GLUCOSE, CAPILLARY: Glucose-Capillary: 96 mg/dL (ref 70–99)

## 2013-12-09 LAB — GLUCOSE, CAPILLARY: GLUCOSE-CAPILLARY: 115 mg/dL — AB (ref 70–99)

## 2013-12-14 LAB — GLUCOSE, CAPILLARY: GLUCOSE-CAPILLARY: 132 mg/dL — AB (ref 70–99)

## 2013-12-16 LAB — GLUCOSE, CAPILLARY: Glucose-Capillary: 108 mg/dL — ABNORMAL HIGH (ref 70–99)

## 2013-12-19 LAB — GLUCOSE, CAPILLARY: Glucose-Capillary: 103 mg/dL — ABNORMAL HIGH (ref 70–99)

## 2013-12-20 ENCOUNTER — Encounter (HOSPITAL_COMMUNITY): Payer: Self-pay | Admitting: Emergency Medicine

## 2013-12-20 ENCOUNTER — Encounter: Payer: Self-pay | Admitting: Internal Medicine

## 2013-12-20 ENCOUNTER — Non-Acute Institutional Stay (SKILLED_NURSING_FACILITY): Payer: Medicare HMO | Admitting: Internal Medicine

## 2013-12-20 ENCOUNTER — Emergency Department (HOSPITAL_COMMUNITY)
Admission: EM | Admit: 2013-12-20 | Discharge: 2013-12-20 | Disposition: A | Discharge status: Home / Self Care | Payer: Medicare HMO | Attending: Emergency Medicine | Admitting: Emergency Medicine

## 2013-12-20 ENCOUNTER — Inpatient Hospital Stay
Admission: RE | Admit: 2013-12-20 | Discharge: 2014-08-11 | Disposition: A | Discharge status: Skilled Nursing Facility | Payer: Medicare HMO | Source: Ambulatory Visit | Attending: Internal Medicine | Admitting: Internal Medicine

## 2013-12-20 ENCOUNTER — Emergency Department (HOSPITAL_COMMUNITY): Payer: Medicare HMO

## 2013-12-20 DIAGNOSIS — F0390 Unspecified dementia without behavioral disturbance: Secondary | ICD-10-CM

## 2013-12-20 DIAGNOSIS — S0990XA Unspecified injury of head, initial encounter: Secondary | ICD-10-CM

## 2013-12-20 DIAGNOSIS — W050XXA Fall from non-moving wheelchair, initial encounter: Secondary | ICD-10-CM | POA: Insufficient documentation

## 2013-12-20 DIAGNOSIS — E875 Hyperkalemia: Secondary | ICD-10-CM | POA: Insufficient documentation

## 2013-12-20 DIAGNOSIS — I10 Essential (primary) hypertension: Secondary | ICD-10-CM

## 2013-12-20 DIAGNOSIS — IMO0002 Reserved for concepts with insufficient information to code with codable children: Secondary | ICD-10-CM

## 2013-12-20 DIAGNOSIS — Z862 Personal history of diseases of the blood and blood-forming organs and certain disorders involving the immune mechanism: Secondary | ICD-10-CM | POA: Insufficient documentation

## 2013-12-20 DIAGNOSIS — Z9861 Coronary angioplasty status: Secondary | ICD-10-CM | POA: Insufficient documentation

## 2013-12-20 DIAGNOSIS — I4891 Unspecified atrial fibrillation: Secondary | ICD-10-CM

## 2013-12-20 DIAGNOSIS — R05 Cough: Principal | ICD-10-CM

## 2013-12-20 DIAGNOSIS — K219 Gastro-esophageal reflux disease without esophagitis: Secondary | ICD-10-CM | POA: Insufficient documentation

## 2013-12-20 DIAGNOSIS — G309 Alzheimer's disease, unspecified: Secondary | ICD-10-CM | POA: Insufficient documentation

## 2013-12-20 DIAGNOSIS — N289 Disorder of kidney and ureter, unspecified: Secondary | ICD-10-CM

## 2013-12-20 DIAGNOSIS — E119 Type 2 diabetes mellitus without complications: Secondary | ICD-10-CM

## 2013-12-20 DIAGNOSIS — F0280 Dementia in other diseases classified elsewhere without behavioral disturbance: Secondary | ICD-10-CM | POA: Insufficient documentation

## 2013-12-20 DIAGNOSIS — F03C0 Unspecified dementia, severe, without behavioral disturbance, psychotic disturbance, mood disturbance, and anxiety: Secondary | ICD-10-CM

## 2013-12-20 DIAGNOSIS — Z87440 Personal history of urinary (tract) infections: Secondary | ICD-10-CM | POA: Insufficient documentation

## 2013-12-20 DIAGNOSIS — J449 Chronic obstructive pulmonary disease, unspecified: Secondary | ICD-10-CM

## 2013-12-20 DIAGNOSIS — Z87448 Personal history of other diseases of urinary system: Secondary | ICD-10-CM | POA: Insufficient documentation

## 2013-12-20 DIAGNOSIS — I482 Chronic atrial fibrillation, unspecified: Secondary | ICD-10-CM

## 2013-12-20 DIAGNOSIS — R059 Cough, unspecified: Principal | ICD-10-CM

## 2013-12-20 DIAGNOSIS — S0100XA Unspecified open wound of scalp, initial encounter: Secondary | ICD-10-CM | POA: Insufficient documentation

## 2013-12-20 DIAGNOSIS — T148XXA Other injury of unspecified body region, initial encounter: Secondary | ICD-10-CM

## 2013-12-20 DIAGNOSIS — Y929 Unspecified place or not applicable: Secondary | ICD-10-CM | POA: Insufficient documentation

## 2013-12-20 DIAGNOSIS — Z7982 Long term (current) use of aspirin: Secondary | ICD-10-CM | POA: Insufficient documentation

## 2013-12-20 DIAGNOSIS — Z79899 Other long term (current) drug therapy: Secondary | ICD-10-CM | POA: Insufficient documentation

## 2013-12-20 DIAGNOSIS — F079 Unspecified personality and behavioral disorder due to known physiological condition: Secondary | ICD-10-CM | POA: Insufficient documentation

## 2013-12-20 DIAGNOSIS — Y9389 Activity, other specified: Secondary | ICD-10-CM | POA: Insufficient documentation

## 2013-12-20 MED ORDER — ACETAMINOPHEN 500 MG PO TABS
1000.0000 mg | ORAL_TABLET | Freq: Once | ORAL | Status: AC
  Administered 2013-12-20: 1000 mg via ORAL
  Filled 2013-12-20: qty 2

## 2013-12-20 NOTE — Discharge Instructions (Signed)
Sutures out in one week.  Clean with soap and water two times a day

## 2013-12-20 NOTE — Progress Notes (Signed)
Patient ID: [name], female   DOB: 08/15/1924, 80 y.o.   MRN: [id number]   This is a routine-acute   visit.  Level of care skilled.  Facility [facility]   Chief complaint-medical management of coronary artery disease -arrhythmia-diabetes type 2-dementia-GERD-renal insufficiency-hypertensionCOPD--acute visit followup UTI  Acute visit status post fall with scalp acerations   History of present illness .  Patient is a long-standing resident of this facility who has been fairly stable- does have a history of what appears to be progressing dementia and this appears to be the case this evening-she is a bit less verbal but pleasant and cooperative with exam.  Most acute issue was a significant fall earlier today apparently she fell out of her wheelchair and obtained a fairly significant laceration to her left parietal area as well as a small abrasion to her right lower leg-she has just returned from the ER were the wound on the head was stapled---per review of ER records they did perform a CT scan of the head as well cervical spine and did not find any acute abnormalities which is encouraging.  Currently she appears to be at her baseline lying in bed she denies really any pain is pleasant smiling again did not speak much but is compliant with any verbal requests.  Her vital signs continued to be stable   .  Last year   she was sent to the hospital with looking clammy she was on Cardizem and metoprolol her creatinine was 1.95 indicating some degree of dehydration.  Her Cardizem was discontinued does continue on Lopressor -e has tolerated this well.  is a type II diabetic on low-dose glipizide blood sugars appear satisfactory largely in the low 100s. Last year she did have a couple syncopal episodes apparently this was related to having bowel movements-this may have been a vasovagal incident-there have been no recent recurrences to my knowledge  Blood pressures appear to be stable--most recently 134/69  113/56 116/64-earlier today noted an elevation of 161/84 and in the ER systolic was in the 150s-however I suspect this was due to the change in setting anxiety from the fall and shortly thereafter.  Again most recent blood pressure 134/69 which appears to be closer to her baseline-we did restart her Norvasc recently secondary to elevated readings at times  In addition to Lopressor and Norvasc she is on low dose lisinopril.5 mg a day   She does have a history of UTIs this usually is since with confusion-she has just finished a course of ciprofloxacin for Klebsiella UTI .  Family medical social history reviewed per progress note most recently ER note from earlier today  .  Medications have been reviewed per [facility]   Review of systems Limited secondary to dementia  In general no fever or chills noted. Skin as noted above she does have staples applied to her laceration on her scalp-also has a small abrasion on right lower leg and left  arm these appear fairly benign  Respiratory no-recent complaints of shortness of breath  Cardiac-no complaints of chest pain.  GI-no complaints of abdominal pain nausea vomiting diarrhea-some constipation at times apparently Colace is helping. Muscle skeletal-is not really complaining of joint pain despite the fall-  Neurologic history of severe dementia-no complaints of headache dizziness or numbness  .  Physical exam  Temperature 97.3 pulse 74 respirations 20 blood pressure 134/69 weight 146.6 this appears to be down about 8 pounds since the spring  .  In general this is a frail  elderly female in no distress lying comfortably in bed.  Her skin is warm and dry--she does have a large laceration parietal scalp staples are in place I do not see any surrounding drainage bleeding erythema She also has a small abrasion to her left arm and right lower leg with an erythematous wound bed there is no surrounding erythema or drainage or sign of acute infection here.    Eyes-pupils appear equal round reactive to light sclera and conjunctiva are clear as visual acuity appears grossly intact.  Oropharynx clear mucous membranes moist. --Tongue is midline Chest --  Clear to auscultation with shallow air entry no labored breathing  Heart-is regular rate and rhythm without murmur gallop or rub- continues with baseline I would say trace lower extremity edema  Abdomen is somewhat obese soft nontender with positive bowel sounds.  Muscle skeletal Limited examination she is in bed but appears able to move all her extremities at baseline with lower extremity weakness-she does ambulate In wheelchair during the day--I did not note any deformities other than the small abrasions as noted above appears to be able to move her upper extremities flexed and extended at elbows without pain-has some what stiff lower extremities but did not re illicit significant pain with attempts at flexion and extension at the knees.  Neurologic is grossly intact her speech is fairly minimal-cranial nerves appear grossly intact.  Psych she is oriented to self does follow simple verbal commands--this is baseline--she does speak but fairly minimally     Labs 08/29/2013.  Sodium 137 potassium 5 BUN 29 creatinine 1.30.  Liver function tests within normal limits except bilirubin of 0.1 albumin of 2.7.    07/28/2013.  Hemoglobin A1c 6.6.  Vitamin D level XLVII.  WBC 7.6 hemoglobin 10.6 platelets 222.  Sodium 141 potassium 4.7 BUN 27 creatinine 1.36.  04/20/2013.  The WBC 8.0 hemoglobin 9.8 platelets 218.  Sodium 137 potassium 5 BUN 32 creatinine 1.45  10/13/2012.  WBC 7.2 hemoglobin 11.5 platelets 243.  Sodium 138 potassium 4.6 BUN 34 creatinine 1.41  08/13/2012.  Liver function tests within normal limits except bilirubin 0.1 albumin of 2.9.  .  08/16/2012.  Sodium 139 potassium 4.7 BUN 31 creatinine 1.41.  08/13/2012  WBC 6.2 hemoglobin 10.7 platelets 229.  Liver function tests within  normal limits except albumin of 2.9 bilirubin of 0.1   Assessment and plan  #1-  History of fall with head laceration-this appears to be stable continue to monitor in facility but she appears to have tolerated this quite well-she is bright and alert although confused which is her baseline does not appear to be in any discomfort-will need followup for staple removal neurologically appears to be stable--studies in the ER of cervical spine and head were negative for any acute process   #2-history of hypertension this appears to have stabilized she is on Lopressor as well as lisinopril and Norvasc --suspect elevations earlier today were due to the fall and anxiety as well as transfer to the ER ...  #3-history renal insufficiency-creatinine of 1.30  recently appears to be baseline will update this   #4-diabetes type 2-this appears stable on low-dose glipizide. --her CBG's pretty consistently in low 100's --hemoglobin A1c of 6.6 in February we will update this  5-coronary artery disease-this has been asymptomatic is on aspirin.   #6-dementia-this appears to be quite significant -- she appears to be doing relatively well with supportive care-she is on Namenda--however I discussed this with her daughter at bedside this evening-daughter  is somewhat skeptical whether this is doing much for her mother at this time-will discuss this with Dr. Leanord Hawkingobson and quite likely may DC this   #7-GERD-this appears to be stable on Zantac.   #8 history of arrhythmia---Afib--this appears to be stable on her current medication--Lopressor andPropafena -  #9-constipation-she continues on Colace- no recent syncopal episodes to my knowledge she did have a couple earlier this year--this may have been vasovagal   #10-history of anemia-recent CBC shows stability awill need updating  11  history weight loss- Family is very supportive and brings him food from outside which apparently she enjoys-she continues on Ensure twice a  day-apparently this weight has been somewhat of a slow decline-I suspect this could be largely due to some advancing dementia.  At this point will continue to monitor-will update lab work including an albumin.  #12-history of vitamin D deficiency she is on calcium with vitamin D supplementation will update vitamin D level  #13 COPD-this is been stable she is on when necessary duo nebs I actually do not hear any wheezing this evening which is an improvement  #14-history of hypokalemia- is on low-dose potassium again will update metabolic panel.  [id number]-of note greater than 35 minutes spent assessing patient-reviewing her chart as well as ER records from visit today-and formulating and  coordinating the plan of care for numerous diagnoses-of note greater than 50% of time spent coordinating plan of care.    [id number]  -  .

## 2013-12-20 NOTE — ED Provider Notes (Signed)
CSN: [id number]     Arrival date & time 12/20/13  1138 History  This chart was scribed for Benny Lennert, MD,  by Ashley Jacobs, ED Scribe. The patient was seen in room APA04/APA04 and the patient's care was started at 12:18 PM.   First MD Initiated Contact with Patient 12/20/13 1216     Chief Complaint  Patient presents with  . Fall     (Consider location/radiation/quality/duration/timing/severity/associated sxs/prior Treatment) The history is provided by medical records. The history is limited by the condition of the patient. No language interpreter was used.   HPI Comments: LEVEL 5 CAVEAT: DEMENTIA [name] is a 80 y.o. female  who comes to the Emergency Department EMS from [facility] after a fall that occurred today. Per pt's notes, pt fell out of her wheel chair and struck the left side of her head. At baseline pt is responds by nodding and does not speak. Pt is oriented to name only. She has a laceration to her left parietal scalp and right lower leg   Past Medical History  Diagnosis Date  . Falls frequently   . Cardiac dysrhythmia   . Anemia   . Organic brain syndrome   . Difficulty walking   . Acute renal failure   . Azotemia   . Hyperkalemia   . UTI (lower urinary tract infection)   . Syncope   . Hypertension   . Acid reflux   . Alzheimer disease   . Diabetes mellitus without complication   . UTI (lower urinary tract infection)   . Dysphagia, oropharyngeal phase   . Dementia, unspecified, without behavioral disturbance   . Other specified cardiac dysrhythmias(427.89)    Past Surgical History  Procedure Laterality Date  . Joint replacement      right hip  . Joint replacement      left hip 2012  . Cardiac surgery      stent  . Appendectomy    . Cholecystectomy    . Abdominal hysterectomy     Family History  Problem Relation Age of Onset  . Diabetes Mother   . Dementia Brother   . Dementia Brother   . Fibromyalgia Daughter    History   Substance Use Topics  . Smoking status: Never Smoker   . Smokeless tobacco: Not on file  . Alcohol Use: No     Comment: unable to assess   OB History   Grav Para Term Preterm Abortions TAB SAB Ect Mult Living                 Review of Systems  Unable to perform ROS: Acuity of condition      Allergies  Contrast media; Statins; Sulfa antibiotics; and Vesicare  Home Medications   Prior to Admission medications   Medication Sig Start Date End Date Taking? Authorizing Provider  acetaminophen (TYLENOL) 325 MG tablet Take 650 mg by mouth every 4 (four) hours as needed. Pain.    Historical Provider, MD  amLODipine (NORVASC) 2.5 MG tablet Take 2.5 mg by mouth daily. For HTN    Historical Provider, MD  aspirin 325 MG tablet Take 325 mg by mouth daily. For cardiac dysrhythmias    Historical Provider, MD  calcium carbonate (TUMS - DOSED IN MG ELEMENTAL CALCIUM) 500 MG chewable tablet Chew 1 tablet by mouth 2 (two) times daily. For reflux    Historical Provider, MD  cetirizine (ZYRTEC) 10 MG tablet Take 10 mg by mouth daily. For allergies  Historical Provider, MD  Cholecalciferol (VITAMIN D-3) 1000 UNITS CAPS Take 2 capsules by mouth daily. For vitamin D replacement    Historical Provider, MD  diphenhydrAMINE (BANOPHEN) 25 MG tablet Take 12.5 mg by mouth every 8 (eight) hours as needed (Pruritis).    Historical Provider, MD  docusate sodium (COLACE) 100 MG capsule Take 100 mg by mouth 2 (two) times daily. For constipation    Historical Provider, MD  ENSURE (ENSURE) Take 237 mLs by mouth 2 (two) times daily. For nutritional supplement    Historical Provider, MD  glipiZIDE (GLUCOTROL XL) 2.5 MG 24 hr tablet Take 2.5 mg by mouth daily. For DM    Historical Provider, MD  HYDROcodone-acetaminophen (NORCO/VICODIN) 5-325 MG per tablet Take one tablet by mouth every 4 hours as needed for pain 08/23/13   Oneal Grout, MD  ipratropium-albuterol (DUONEB) 0.5-2.5 (3) MG/3ML SOLN Take 3 mLs by  nebulization every 6 (six) hours as needed. SOB/Wheezing     Historical Provider, MD  lisinopril (PRINIVIL,ZESTRIL) 2.5 MG tablet Take 5 mg by mouth daily.     Historical Provider, MD  LORazepam (ATIVAN) 0.5 MG tablet Take 1/2 tablet by mouth every 8 hours as needed for anxiety 10/24/13   Tiffany L Reed, DO  memantine (NAMENDA) 10 MG tablet Take 10 mg by mouth 2 (two) times daily. For dementia    Historical Provider, MD  metoprolol (LOPRESSOR) 50 MG tablet Take 50 mg by mouth 2 (two) times daily. *HOLD for SBP 100 or PULSE 50* For HTN    Historical Provider, MD  potassium chloride (K-DUR,KLOR-CON) 10 MEQ tablet Take 10 mEq by mouth 2 (two) times daily.    Historical Provider, MD  predniSONE (DELTASONE) 20 MG tablet Take 40 mg by mouth daily. For arrhythmia 10/22/12   Charles B. Bernette Mayers, MD  propafenone (RYTHMOL) 225 MG tablet Take 225 mg by mouth 2 (two) times daily.      Historical Provider, MD  ranitidine (ZANTAC) 75 MG tablet Take 75 mg by mouth 2 (two) times daily.    Historical Provider, MD  senna (SENOKOT) 8.6 MG tablet Take 1 tablet by mouth 2 (two) times daily as needed. Constipation.    Historical Provider, MD   BP 158/68  Pulse 63  Temp(Src) 97.6 F (36.4 C) (Oral)  Resp 18  SpO2 100% Physical Exam  Nursing note and vitals reviewed. Constitutional: She is oriented to person, place, and time. No distress.  Awake. Alert. Oriented to name only.   HENT:  Head: Normocephalic.  Eyes: Conjunctivae and EOM are normal. No scleral icterus.  Neck: Neck supple. No thyromegaly present.  Cardiovascular: Normal rate and regular rhythm.   Pulmonary/Chest: No stridor. She has no wheezes. She has no rales. She exhibits no tenderness.  Abdominal: She exhibits no distension. There is no tenderness. There is no rebound.  Musculoskeletal: Normal range of motion. She exhibits no edema.  Lymphadenopathy:    She has no cervical adenopathy.  Neurological: She is oriented to person, place, and time. She  exhibits normal muscle tone. Coordination normal.  Skin: No rash noted. No erythema.  5 cm laceration to her left parietal scalp. Small abrasion to right lower leg.  Psychiatric: She has a normal mood and affect. Her behavior is normal.    ED Course  LACERATION REPAIR Date/Time: 12/20/2013 3:31 PM Performed by: Alisson Rozell L Authorized by: Bethann Berkshire L Comments: Two lacerations to scalp,  A 3 cm lac and a 2 cm lac.  Cleaned with betadine and  21 staples used to close lac   (including critical care time) DIAGNOSTIC STUDIES:   COORDINATION OF CARE:  12:22 PM Examined pt.   Labs Review Labs Reviewed - No data to display  Imaging Review No results found.   EKG Interpretation None      MDM   Final diagnoses:  None    Scalp laceration  The chart was scribed for me under my direct supervision.  I personally performed the history, physical, and medical decision making and all procedures in the evaluation of this patient.Benny Lennert.     Makaria Poarch L Alvenia Treese, MD 12/20/13 [id number]

## 2013-12-20 NOTE — ED Notes (Signed)
Pt reports to ED via EMS from [facility]. EMS reports that pt fell out of wheel chair. Pt has 4 in lac on top left of head. Pt has skin tear on left elbow and right shin. Pt will respond with nodding but will not speak; this is her "baseline" as [facility]; she will talk on her own terms. Pt Alert to questions and will nod. Pt in NAD.

## 2014-01-02 LAB — GLUCOSE, CAPILLARY: GLUCOSE-CAPILLARY: 131 mg/dL — AB (ref 70–99)

## 2014-01-04 LAB — GLUCOSE, CAPILLARY
Glucose-Capillary: 84 mg/dL (ref 70–99)
Glucose-Capillary: 174 mg/dL — ABNORMAL HIGH (ref 70–99)

## 2014-01-06 LAB — GLUCOSE, CAPILLARY: Glucose-Capillary: 105 mg/dL — ABNORMAL HIGH (ref 70–99)

## 2014-01-09 LAB — GLUCOSE, CAPILLARY: Glucose-Capillary: 117 mg/dL — ABNORMAL HIGH (ref 70–99)

## 2014-01-11 ENCOUNTER — Non-Acute Institutional Stay (SKILLED_NURSING_FACILITY): Payer: Medicare HMO | Admitting: Internal Medicine

## 2014-01-11 DIAGNOSIS — I2510 Atherosclerotic heart disease of native coronary artery without angina pectoris: Secondary | ICD-10-CM

## 2014-01-11 LAB — GLUCOSE, CAPILLARY: Glucose-Capillary: 110 mg/dL — ABNORMAL HIGH (ref 70–99)

## 2014-01-13 LAB — GLUCOSE, CAPILLARY: Glucose-Capillary: 104 mg/dL — ABNORMAL HIGH (ref 70–99)

## 2014-01-13 NOTE — Progress Notes (Signed)
Patient ID: [name], female   DOB: 01/16/1925, 80 y.o.   MRN: [id number]           PROGRESS NOTE  DATE: 01/11/2014           FACILITY:  [facility]  LEVEL OF CARE: SNF (31)  Acute Visit  CHIEF COMPLAINT:  Manage CAD.    HISTORY OF PRESENT ILLNESS: I was requested by the staff to assess the patient regarding above problem(s):  CAD:  I was requested by the pharmacy consultant to assess the patient for a possible dose reduction of her aspirin from 325 mg q.d. to 81 mg q.d. due to increased risk of bleeding.  The angina has been stable. The staff denies dyspnea on exertion, orthopnea, pedal edema, palpitations and paroxysmal nocturnal dyspnea. No complications noted from the medication presently being used.   Patient is a poor historian.     PAST MEDICAL HISTORY : Reviewed.  No changes/see problem list  CURRENT MEDICATIONS: Reviewed per MAR/see medication list  PHYSICAL EXAMINATION  VS: see VS section  GENERAL: no acute distress, moderately obese body habitus RESPIRATORY: breathing is even & unlabored, BS CTAB CARDIAC: RRR, no murmur,no extra heart sounds, no edema  ASSESSMENT/PLAN:  CAD.  Stable.   Okay to decrease aspirin to 81 mg q.d.        CPT CODE: [id number]         Angela CoxGayani Y Leira Regino, MD [facility] [phone]

## 2014-01-16 LAB — GLUCOSE, CAPILLARY: Glucose-Capillary: 130 mg/dL — ABNORMAL HIGH (ref 70–99)

## 2014-01-18 LAB — GLUCOSE, CAPILLARY: GLUCOSE-CAPILLARY: 96 mg/dL (ref 70–99)

## 2014-01-20 LAB — GLUCOSE, CAPILLARY: Glucose-Capillary: 139 mg/dL — ABNORMAL HIGH (ref 70–99)

## 2014-01-25 LAB — GLUCOSE, CAPILLARY: Glucose-Capillary: 106 mg/dL — ABNORMAL HIGH (ref 70–99)

## 2014-01-27 LAB — GLUCOSE, CAPILLARY: Glucose-Capillary: 110 mg/dL — ABNORMAL HIGH (ref 70–99)

## 2014-01-30 LAB — GLUCOSE, CAPILLARY: Glucose-Capillary: 123 mg/dL — ABNORMAL HIGH (ref 70–99)

## 2014-02-01 LAB — GLUCOSE, CAPILLARY: Glucose-Capillary: 116 mg/dL — ABNORMAL HIGH (ref 70–99)

## 2014-02-03 LAB — GLUCOSE, CAPILLARY: Glucose-Capillary: 125 mg/dL — ABNORMAL HIGH (ref 70–99)

## 2014-02-06 LAB — GLUCOSE, CAPILLARY: GLUCOSE-CAPILLARY: 109 mg/dL — AB (ref 70–99)

## 2014-02-08 LAB — GLUCOSE, CAPILLARY: Glucose-Capillary: 113 mg/dL — ABNORMAL HIGH (ref 70–99)

## 2014-02-10 LAB — GLUCOSE, CAPILLARY: Glucose-Capillary: 106 mg/dL — ABNORMAL HIGH (ref 70–99)

## 2014-02-15 LAB — GLUCOSE, CAPILLARY: Glucose-Capillary: 93 mg/dL (ref 70–99)

## 2014-02-17 LAB — GLUCOSE, CAPILLARY: GLUCOSE-CAPILLARY: 105 mg/dL — AB (ref 70–99)

## 2014-02-20 LAB — GLUCOSE, CAPILLARY: GLUCOSE-CAPILLARY: 110 mg/dL — AB (ref 70–99)

## 2014-02-22 LAB — GLUCOSE, CAPILLARY: Glucose-Capillary: 88 mg/dL (ref 70–99)

## 2014-02-24 LAB — GLUCOSE, CAPILLARY
GLUCOSE-CAPILLARY: 138 mg/dL — AB (ref 70–99)
GLUCOSE-CAPILLARY: 98 mg/dL (ref 70–99)

## 2014-02-27 LAB — GLUCOSE, CAPILLARY: Glucose-Capillary: 98 mg/dL (ref 70–99)

## 2014-03-01 LAB — GLUCOSE, CAPILLARY: Glucose-Capillary: 84 mg/dL (ref 70–99)

## 2014-03-03 LAB — GLUCOSE, CAPILLARY: Glucose-Capillary: 89 mg/dL (ref 70–99)

## 2014-03-06 LAB — GLUCOSE, CAPILLARY: Glucose-Capillary: 111 mg/dL — ABNORMAL HIGH (ref 70–99)

## 2014-03-08 LAB — GLUCOSE, CAPILLARY: Glucose-Capillary: 100 mg/dL — ABNORMAL HIGH (ref 70–99)

## 2014-03-10 LAB — GLUCOSE, CAPILLARY: GLUCOSE-CAPILLARY: 109 mg/dL — AB (ref 70–99)

## 2014-03-13 LAB — GLUCOSE, CAPILLARY: Glucose-Capillary: 81 mg/dL (ref 70–99)

## 2014-03-14 LAB — GLUCOSE, CAPILLARY: Glucose-Capillary: 234 mg/dL — ABNORMAL HIGH (ref 70–99)

## 2014-03-15 LAB — GLUCOSE, CAPILLARY: GLUCOSE-CAPILLARY: 112 mg/dL — AB (ref 70–99)

## 2014-03-17 LAB — GLUCOSE, CAPILLARY: Glucose-Capillary: 95 mg/dL (ref 70–99)

## 2014-03-20 LAB — GLUCOSE, CAPILLARY: Glucose-Capillary: 107 mg/dL — ABNORMAL HIGH (ref 70–99)

## 2014-03-22 LAB — GLUCOSE, CAPILLARY: Glucose-Capillary: 109 mg/dL — ABNORMAL HIGH (ref 70–99)

## 2014-03-24 LAB — GLUCOSE, CAPILLARY: Glucose-Capillary: 121 mg/dL — ABNORMAL HIGH (ref 70–99)

## 2014-03-27 LAB — GLUCOSE, CAPILLARY: Glucose-Capillary: 101 mg/dL — ABNORMAL HIGH (ref 70–99)

## 2014-03-29 LAB — GLUCOSE, CAPILLARY: Glucose-Capillary: 108 mg/dL — ABNORMAL HIGH (ref 70–99)

## 2014-03-31 LAB — GLUCOSE, CAPILLARY: GLUCOSE-CAPILLARY: 109 mg/dL — AB (ref 70–99)

## 2014-04-03 LAB — GLUCOSE, CAPILLARY: Glucose-Capillary: 105 mg/dL — ABNORMAL HIGH (ref 70–99)

## 2014-04-04 ENCOUNTER — Encounter: Payer: Self-pay | Admitting: *Deleted

## 2014-04-05 LAB — GLUCOSE, CAPILLARY: GLUCOSE-CAPILLARY: 111 mg/dL — AB (ref 70–99)

## 2014-04-07 LAB — GLUCOSE, CAPILLARY: GLUCOSE-CAPILLARY: 94 mg/dL (ref 70–99)

## 2014-04-10 LAB — GLUCOSE, CAPILLARY: Glucose-Capillary: 112 mg/dL — ABNORMAL HIGH (ref 70–99)

## 2014-04-12 LAB — GLUCOSE, CAPILLARY: Glucose-Capillary: 123 mg/dL — ABNORMAL HIGH (ref 70–99)

## 2014-04-14 LAB — GLUCOSE, CAPILLARY: Glucose-Capillary: 107 mg/dL — ABNORMAL HIGH (ref 70–99)

## 2014-04-17 LAB — GLUCOSE, CAPILLARY: Glucose-Capillary: 109 mg/dL — ABNORMAL HIGH (ref 70–99)

## 2014-04-19 ENCOUNTER — Non-Acute Institutional Stay (SKILLED_NURSING_FACILITY): Payer: Medicare HMO | Admitting: Internal Medicine

## 2014-04-19 ENCOUNTER — Encounter: Payer: Self-pay | Admitting: Internal Medicine

## 2014-04-19 DIAGNOSIS — N289 Disorder of kidney and ureter, unspecified: Secondary | ICD-10-CM

## 2014-04-19 DIAGNOSIS — J42 Unspecified chronic bronchitis: Secondary | ICD-10-CM

## 2014-04-19 DIAGNOSIS — E119 Type 2 diabetes mellitus without complications: Secondary | ICD-10-CM

## 2014-04-19 DIAGNOSIS — F03C0 Unspecified dementia, severe, without behavioral disturbance, psychotic disturbance, mood disturbance, and anxiety: Secondary | ICD-10-CM

## 2014-04-19 DIAGNOSIS — K219 Gastro-esophageal reflux disease without esophagitis: Secondary | ICD-10-CM

## 2014-04-19 DIAGNOSIS — F0390 Unspecified dementia without behavioral disturbance: Secondary | ICD-10-CM

## 2014-04-19 DIAGNOSIS — I482 Chronic atrial fibrillation, unspecified: Secondary | ICD-10-CM

## 2014-04-19 DIAGNOSIS — I10 Essential (primary) hypertension: Secondary | ICD-10-CM

## 2014-04-19 LAB — GLUCOSE, CAPILLARY: GLUCOSE-CAPILLARY: 127 mg/dL — AB (ref 70–99)

## 2014-04-19 NOTE — Progress Notes (Signed)
Patient ID: [name], female   DOB: 10/14/1924, 80 y.o.   MRN: [id number]                                                                                                                                                                                                                                             This is a routine visit.  Level care skilled.  Facility [facility]  Chief complaint medical management of coronary artery disease arrhythmia diabetes type 2 dementia GERD renal insufficiency hypertension COPD.  History of present illness. Patient is a long-standing resident at this facility with the above diagnosesshe has been relatively stable recently-her dementia appears to be progressing which is not new.  About a year ago she did go to the hospital looking clammy was on Cardizem and metoprolol her creatinine was elevated at 1.95-her Cardizem was discontinued she is on Lopressor and has tolerated this well.  She also is a type II diabetic on low-dose glipizide blood sugars appear to be satisfactory in the low 100s hemoglobin A1c also satisfactory most recently 6.8 in July.  She also has had a couple syncopal episodes last year related to bowel movements-this was thought to be possibly vasovagal there've been no recent reoccurrences to my knowledge.  Blood pressures continued to be somewhat variable 153/63-141/56-136/61 most recently she is on lisinopril 5 mg a day and Norvasc was recently restarted at 2.5 mg a day.  She is also on Lopressor.  Family medical social history is been reviewed per previous progressincluding 07/13/2012 and 10/28/2013.  Medications have been reviewed per MAR.  Review of systems limited secondary to dementia but per nursing in patient there are no complaints of shortness of breath chest pain or joint pain currently no complaints of syncopal episodes  recently.  Physical exam.  She is afebrile pulse 68 respirations 20 blood pressure as noted in history of present illness.--I did take it manually and got 170/68 however she was somewhat excited and agitated which could've contributed to the higher systolic although it does appear she has somewhat consistently mildly elevated systolics  General this is a frail elderly female in no distress sitting comfortably in her wheelchair.  Her skin is warm and dry.  Eyes pupils appear equal round reactive to light sclerae and conjunctivae are clear visual acuity appears grossly intact.  Oropharynx is clear mucous membranes  moist.  Chest is clear to auscultation with shallow air entry no labored breathing.  Heart regular rate and rhythm with some distant heart sounds-she has trace lower extremity edema.  Abdomen continues to be somewhat obese soft nontender positive bowel sounds.  Muscle skeletal moves all extremities 4 I do not see any deformities this appears to be a baseline exam. Neurologic is grossly intact her speech is minimal but clearcranial nerves appear grossly intact.  Psych she is oriented to self does follow simple verbal commands at times.  Labs.  02/08/2014.  Sodium 139 potassium 4.6 BUN 22 creatinine 1.17.  Hemoglobin A1c 6.8.  UA 19 2015.  Hemoglobin A1c 6.6 -.  WBC 7.6 hemoglobin 10.6 platelets 222.  Sodium 141 potassium 4.7 BUN 27 creatinine 1.36.   Assessment and plan. #1history of hypertension continues to have some variability she is on Lopressor as well as lisinopril and Norvasc will  increase Norvasc to 5 mg a day and monitor blood pressures daily with log review in provider book  #2 history renal insufficiency this has had some variability actually creatinine appears improved on most recent lab will update this.  Continue to push fluids aggressively.  #3 history coronary artery disease this is been relatively asymptomatic she is on aspirin and the dose  was reduced per pharmacy recommendation recently. #4dementia this appears to be slowly progressing and fairly significant she is doing well with supportive care she is on Namenda.  #5 history of arrhythmia--A-fib---this appears stable on current medications including propafenone and Lopressor.  .  #6-history of anemia we will update a CBC this has been stable in the past.  #7 history of GERD this appears stable on Zantac.  #8-history diabetes type 2 she is on low-dose glipizide will update a hemoglobin A1c CBGs appear to be fairly satisfactory.  #9-history COPD this is been relatively stable recently she does have duo-nebs as needed.  #10 history of vitamin D deficiency she is on supplementation will update a vitamin D level.  #11-she does have a history of allergic rhinitis this is been relatively asymptomatic recently she is on Zyrtec-  [id number]  [id number]  . [id number]

## 2014-04-21 LAB — GLUCOSE, CAPILLARY
GLUCOSE-CAPILLARY: 155 mg/dL — AB (ref 70–99)
Glucose-Capillary: 137 mg/dL — ABNORMAL HIGH (ref 70–99)

## 2014-04-24 LAB — GLUCOSE, CAPILLARY: GLUCOSE-CAPILLARY: 105 mg/dL — AB (ref 70–99)

## 2014-04-26 LAB — GLUCOSE, CAPILLARY: Glucose-Capillary: 104 mg/dL — ABNORMAL HIGH (ref 70–99)

## 2014-04-27 ENCOUNTER — Other Ambulatory Visit: Payer: Self-pay | Admitting: *Deleted

## 2014-04-27 MED ORDER — LORAZEPAM 0.5 MG PO TABS: ORAL_TABLET | ORAL | Status: DC

## 2014-04-27 NOTE — Telephone Encounter (Signed)
[facility] 

## 2014-04-28 LAB — GLUCOSE, CAPILLARY: Glucose-Capillary: 106 mg/dL — ABNORMAL HIGH (ref 70–99)

## 2014-05-01 LAB — GLUCOSE, CAPILLARY: Glucose-Capillary: 131 mg/dL — ABNORMAL HIGH (ref 70–99)

## 2014-05-03 LAB — GLUCOSE, CAPILLARY: GLUCOSE-CAPILLARY: 109 mg/dL — AB (ref 70–99)

## 2014-05-04 ENCOUNTER — Inpatient Hospital Stay (HOSPITAL_COMMUNITY)
Admit: 2014-05-04 | Discharge: 2014-05-04 | Disposition: A | Discharge status: Home / Self Care | Payer: Medicare HMO | Attending: Internal Medicine | Admitting: Internal Medicine

## 2014-05-06 LAB — GLUCOSE, CAPILLARY: Glucose-Capillary: 108 mg/dL — ABNORMAL HIGH (ref 70–99)

## 2014-05-08 LAB — GLUCOSE, CAPILLARY: Glucose-Capillary: 102 mg/dL — ABNORMAL HIGH (ref 70–99)

## 2014-05-10 LAB — GLUCOSE, CAPILLARY: Glucose-Capillary: 112 mg/dL — ABNORMAL HIGH (ref 70–99)

## 2014-05-12 LAB — GLUCOSE, CAPILLARY
GLUCOSE-CAPILLARY: 107 mg/dL — AB (ref 70–99)
Glucose-Capillary: 233 mg/dL — ABNORMAL HIGH (ref 70–99)

## 2014-05-15 LAB — GLUCOSE, CAPILLARY: GLUCOSE-CAPILLARY: 109 mg/dL — AB (ref 70–99)

## 2014-05-17 LAB — GLUCOSE, CAPILLARY: Glucose-Capillary: 116 mg/dL — ABNORMAL HIGH (ref 70–99)

## 2014-05-19 LAB — GLUCOSE, CAPILLARY: Glucose-Capillary: 101 mg/dL — ABNORMAL HIGH (ref 70–99)

## 2014-05-22 ENCOUNTER — Other Ambulatory Visit: Payer: Self-pay | Admitting: *Deleted

## 2014-05-22 LAB — GLUCOSE, CAPILLARY: Glucose-Capillary: 109 mg/dL — ABNORMAL HIGH (ref 70–99)

## 2014-05-22 MED ORDER — HYDROCODONE-ACETAMINOPHEN 5-325 MG PO TABS: ORAL_TABLET | ORAL | Status: DC

## 2014-05-22 NOTE — Telephone Encounter (Signed)
[facility] 

## 2014-05-24 LAB — GLUCOSE, CAPILLARY: Glucose-Capillary: 102 mg/dL — ABNORMAL HIGH (ref 70–99)

## 2014-05-26 LAB — GLUCOSE, CAPILLARY: GLUCOSE-CAPILLARY: 103 mg/dL — AB (ref 70–99)

## 2014-05-29 LAB — GLUCOSE, CAPILLARY: Glucose-Capillary: 108 mg/dL — ABNORMAL HIGH (ref 70–99)

## 2014-05-30 ENCOUNTER — Non-Acute Institutional Stay (SKILLED_NURSING_FACILITY): Payer: Medicare HMO | Admitting: Internal Medicine

## 2014-05-30 ENCOUNTER — Encounter: Payer: Self-pay | Admitting: Internal Medicine

## 2014-05-30 DIAGNOSIS — J42 Unspecified chronic bronchitis: Secondary | ICD-10-CM

## 2014-05-30 DIAGNOSIS — F0390 Unspecified dementia without behavioral disturbance: Secondary | ICD-10-CM

## 2014-05-30 DIAGNOSIS — E119 Type 2 diabetes mellitus without complications: Secondary | ICD-10-CM

## 2014-05-30 DIAGNOSIS — F03C0 Unspecified dementia, severe, without behavioral disturbance, psychotic disturbance, mood disturbance, and anxiety: Secondary | ICD-10-CM

## 2014-05-30 DIAGNOSIS — I482 Chronic atrial fibrillation, unspecified: Secondary | ICD-10-CM

## 2014-05-30 DIAGNOSIS — I10 Essential (primary) hypertension: Secondary | ICD-10-CM

## 2014-05-30 DIAGNOSIS — N289 Disorder of kidney and ureter, unspecified: Secondary | ICD-10-CM

## 2014-05-30 NOTE — Progress Notes (Signed)
Patient ID: [name], female   DOB: 09/09/1924, 80 y.o.   MRN: [id number]   This is a routine visit.  Level care skilled.  Facility [facility]  Chief complaint medical management of coronary artery disease arrhythmia diabetes type 2 dementia GERD renal insufficiency hypertension COPD.  History of present illness. Patient is a long-standing resident at this facility with the above diagnosesshe has been relatively stable recently-her dementia appears to be progressing which is not new.  About a year ago she did go to the hospital looking clammy was on Cardizem and metoprolol her creatinine was elevated at 1.95-her Cardizem was discontinued she is on Lopressor and has tolerated this well.  She also is a type II diabetic on low-dose glipizide blood sugars appear to be satisfactory in the low 100s hemoglobin A1c also satisfactory most recently 6.8 in July.  She also has had a couple syncopal episodes last year related to bowel movements-this was thought to be possibly vasovagal there've been no recent reoccurrences to my knowledge.  Blood pressures continued to be somewhat variable most recently she is on lisinopril 5 mg a day and Norvasc was recently restarted at 2.5 mg a day.  She is also on Lopressor.  Family medical social history is been reviewed per previous progressincluding 07/13/2012 and 10/28/2013.  Medications have been reviewed per MAR.  Review of systems limited secondary to dementia but per nursing in patient there are no complaints of shortness of breath chest pain or joint pain currently no complaints of syncopal episodes recently.  Physical exam.  She is afebrile also is 68 respirations 20 blood pressure 141/46-118/84-in this range recently weight is 143.2   General this is a frail elderly female in no distress sitting comfortably in her wheelchair.  Her skin is warm and dry.  Eyes pupils appear equal round reactive to light sclerae and conjunctivae are clear  visual acuity appears grossly intact.  Oropharynx is clear mucous membranes moist.  Chest is clear to auscultation with shallow air entry no labored breathing.  Heart regular rate and rhythm with some distant heart sounds-she has trace lower extremity edema.  Abdomen continues to be somewhat obese soft nontender positive bowel sounds.  Muscle skeletal moves all extremities 4 I do not see any deformities this appears to be a baseline exam. Neurologic is grossly intact her speech is minimal but clearcranial nerves appear grossly intact.  Psych she is oriented to self does follow simple verbal commands at times.--speaking somewhat more today than I have seen recently  Labs.  04/24/2014.  Sodium 139 potassium 4.9 BUN 30 creatinine 1.37.  04/20/2014.  WBC 7.9 hemoglobin 9.8 platelets 253.  Liver function tests within normal limits except albumin of 2.8.    02/08/2014.  Sodium 139 potassium 4.6 BUN 22 creatinine 1.17.  Hemoglobin A1c 6.8.  UA 19 2015.  Hemoglobin A1c 6.6 -.  WBC 7.6 hemoglobin 10.6 platelets 222.  Sodium 141 potassium 4.7 BUN 27 creatinine 1.36.   Assessment and plan.  #1history of hypertension continues to have some variability she is on Lopressor as well as lisinopril and Norvasc --this point will monitor  #2 history renal insufficiency this has had some variability actually creatinine appears stable  on most recent lab will update this.  Continue to push fluids aggressively.  #3 history coronary artery disease this is been relatively asymptomatic she is on aspirin and the dose was reduced per pharmacy recommendation recently . #4dementia this appears to be slowly progressing and fairly significant she  is doing well with supportive care she is on Namenda--actually appears to be a bit more interactive-talkative today.  #5 history of arrhythmia--A-fib---this appears stable on current medications including propafenone and Lopressor.  .  #6-history  of anemia we will update a CBC this has been stable in the past.  #7 history of GERD this appears stable on Zantac.  #8-history diabetes type 2 she is on low-dose glipizide CBGs appear to be fairly satisfactory-CBG's in the low 100s ----will update hemoglobin A1c.  #9-history COPD this is been relatively stable recently she does have duo-nebs as needed.  #10 history of vitamin D deficiency she is on supplementation will update a vitamin D level.  #11-she does have a history of allergic rhinitis this is been relatively asymptomatic recently she is on Zyrtec-  [phone]

## 2014-05-31 LAB — GLUCOSE, CAPILLARY: Glucose-Capillary: 116 mg/dL — ABNORMAL HIGH (ref 70–99)

## 2014-06-02 LAB — GLUCOSE, CAPILLARY: Glucose-Capillary: 118 mg/dL — ABNORMAL HIGH (ref 70–99)

## 2014-06-05 LAB — GLUCOSE, CAPILLARY: GLUCOSE-CAPILLARY: 114 mg/dL — AB (ref 70–99)

## 2014-06-07 LAB — GLUCOSE, CAPILLARY: GLUCOSE-CAPILLARY: 115 mg/dL — AB (ref 70–99)

## 2014-06-09 LAB — GLUCOSE, CAPILLARY: Glucose-Capillary: 119 mg/dL — ABNORMAL HIGH (ref 70–99)

## 2014-06-11 LAB — GLUCOSE, CAPILLARY: GLUCOSE-CAPILLARY: 108 mg/dL — AB (ref 70–99)

## 2014-06-12 LAB — GLUCOSE, CAPILLARY: Glucose-Capillary: 115 mg/dL — ABNORMAL HIGH (ref 70–99)

## 2014-06-14 LAB — GLUCOSE, CAPILLARY: GLUCOSE-CAPILLARY: 120 mg/dL — AB (ref 70–99)

## 2014-06-16 LAB — GLUCOSE, CAPILLARY: Glucose-Capillary: 116 mg/dL — ABNORMAL HIGH (ref 70–99)

## 2014-06-21 LAB — GLUCOSE, CAPILLARY: GLUCOSE-CAPILLARY: 121 mg/dL — AB (ref 70–99)

## 2014-06-23 LAB — GLUCOSE, CAPILLARY: Glucose-Capillary: 110 mg/dL — ABNORMAL HIGH (ref 70–99)

## 2014-06-26 LAB — GLUCOSE, CAPILLARY: Glucose-Capillary: 106 mg/dL — ABNORMAL HIGH (ref 70–99)

## 2014-06-28 LAB — GLUCOSE, CAPILLARY
Glucose-Capillary: 108 mg/dL — ABNORMAL HIGH (ref 70–99)
Glucose-Capillary: 157 mg/dL — ABNORMAL HIGH (ref 70–99)

## 2014-06-30 LAB — GLUCOSE, CAPILLARY: Glucose-Capillary: 125 mg/dL — ABNORMAL HIGH (ref 70–99)

## 2014-07-03 LAB — GLUCOSE, CAPILLARY: Glucose-Capillary: 108 mg/dL — ABNORMAL HIGH (ref 70–99)

## 2014-07-05 LAB — GLUCOSE, CAPILLARY: GLUCOSE-CAPILLARY: 110 mg/dL — AB (ref 70–99)

## 2014-07-07 LAB — GLUCOSE, CAPILLARY: GLUCOSE-CAPILLARY: 120 mg/dL — AB (ref 70–99)

## 2014-07-10 LAB — GLUCOSE, CAPILLARY: Glucose-Capillary: 102 mg/dL — ABNORMAL HIGH (ref 70–99)

## 2014-07-12 LAB — GLUCOSE, CAPILLARY: Glucose-Capillary: 138 mg/dL — ABNORMAL HIGH (ref 70–99)

## 2014-07-14 LAB — GLUCOSE, CAPILLARY: Glucose-Capillary: 99 mg/dL (ref 70–99)

## 2014-07-17 LAB — GLUCOSE, CAPILLARY: Glucose-Capillary: 117 mg/dL — ABNORMAL HIGH (ref 70–99)

## 2014-07-19 LAB — GLUCOSE, CAPILLARY: Glucose-Capillary: 105 mg/dL — ABNORMAL HIGH (ref 70–99)

## 2014-07-21 LAB — GLUCOSE, CAPILLARY: GLUCOSE-CAPILLARY: 100 mg/dL — AB (ref 70–99)

## 2014-07-24 LAB — GLUCOSE, CAPILLARY: Glucose-Capillary: 111 mg/dL — ABNORMAL HIGH (ref 70–99)

## 2014-07-28 LAB — GLUCOSE, CAPILLARY: Glucose-Capillary: 120 mg/dL — ABNORMAL HIGH (ref 70–99)

## 2014-07-31 LAB — GLUCOSE, CAPILLARY: Glucose-Capillary: 119 mg/dL — ABNORMAL HIGH (ref 70–99)

## 2014-08-02 ENCOUNTER — Non-Acute Institutional Stay (SKILLED_NURSING_FACILITY): Payer: Medicare HMO | Admitting: Internal Medicine

## 2014-08-02 DIAGNOSIS — I10 Essential (primary) hypertension: Secondary | ICD-10-CM

## 2014-08-02 DIAGNOSIS — F0390 Unspecified dementia without behavioral disturbance: Secondary | ICD-10-CM

## 2014-08-02 DIAGNOSIS — I482 Chronic atrial fibrillation, unspecified: Secondary | ICD-10-CM

## 2014-08-02 DIAGNOSIS — J438 Other emphysema: Secondary | ICD-10-CM

## 2014-08-02 DIAGNOSIS — E119 Type 2 diabetes mellitus without complications: Secondary | ICD-10-CM

## 2014-08-02 DIAGNOSIS — N289 Disorder of kidney and ureter, unspecified: Secondary | ICD-10-CM

## 2014-08-02 DIAGNOSIS — F03C0 Unspecified dementia, severe, without behavioral disturbance, psychotic disturbance, mood disturbance, and anxiety: Secondary | ICD-10-CM

## 2014-08-02 LAB — GLUCOSE, CAPILLARY: Glucose-Capillary: 105 mg/dL — ABNORMAL HIGH (ref 70–99)

## 2014-08-02 NOTE — Progress Notes (Signed)
Patient ID: [name], female   DOB: 06/12/1924, 81 y.o.   MRN: [id number]   his is a routine visit.  Level care skilled.  Facility [facility]  Chief complaint medical management of coronary artery disease arrhythmia diabetes type 2 dementia GERD renal insufficiency hypertension COPD.  History of present illness. Patient is a long-standing resident at this facility with the above diagnosesshe has been relatively stable recently-her dementia appears to be progressing which is not new.  About a year ago she did go to the hospital looking clammy was on Cardizem and metoprolol her creatinine was elevated at 1.95-her Cardizem was discontinued she is on Lopressor and has tolerated this well.  She also is a type II diabetic on low-dose glipizide blood sugars appear to be satisfactory in the low 100s hemoglobin A1c also satisfactory most recently 6.8 in July.  She also has had a couple syncopal episodes last year related to bowel movements-this was thought to be possibly vasovagal there've been no recent reoccurrences to my knowledge.  Blood pressures continued to be somewhat variable most recently she is on lisinopril 5 mg a day and Norvasc 5 mg a day and recent list of blood pressures 160/47 143/52 158/76.  She is also on Lopressor.  Family medical social history has been reviewed per previous progressincluding 07/13/2012 and 10/28/2013.--And 05/30/2014  [facility]  Review of systems limited secondary to dementia but per nursing in patient there are no complaints of shortness of breath chest pain or joint pain currently no complaints of syncopal episodes recently.  Physical exam.   She is afebrile pulse of 68 respirations 19 blood pressures as listed above her weight appears to be stable at 141--took blood pressure manually and got 160/60   General this is a frail elderly female in no distress sitting comfortably in her wheelchair.  Her skin is warm and  dry.  Eyes pupils appear equal round reactive to light sclerae and conjunctivae are clear visual acuity appears grossly intact.  Oropharynx is clear mucous membranes moist.  Chest is clear to auscultation with shallow air entry no labored breathing.  Heart regular rate and rhythm with some distant heart sounds-she has trace lower extremity edema.  Abdomen continues to be somewhat obese soft nontender positive bowel sounds.  Muscle skeletal moves all extremities 4 I do not see any deformities this appears to be a baseline exam. Neurologic is grossly intact her speech is minimal but clearcranial nerves appear grossly intact.  Psych she is oriented to self does follow simple verbal commands at times.--She followed commands fine today and did speak somewhat says she's was feeling fine  Labs.  05/31/2014.  WBC 7.4 hemoglobin 9.3 platelets 241.  Sodium 135 potassium 4.6 BUN 30 creatinine 1.27  04/24/2014.  Sodium 139 potassium 4.9 BUN 30 creatinine 1.37.  04/20/2014.  WBC 7.9 hemoglobin 9.8 platelets 253.  Liver function tests within normal limits except albumin of 2.8.    02/08/2014.  Sodium 139 potassium 4.6 BUN 22 creatinine 1.17.  Hemoglobin A1c 6.8.  UA 19 2015.  Hemoglobin A1c 6.6 -.  WBC 7.6 hemoglobin 10.6 platelets 222.  Sodium 141 potassium 4.7 BUN 27 creatinine 1.36.   Assessment and plan.  #1history of hypertension continues to have some variability she is on Lopressor as well as lisinopril and Norvasc --systolics appear to be somewhat consistently elevated we will increase Norvasc to 10 mg a day and monitor blood pressures daily  #2 history renal insufficiency this has  had some variability actually creatinine appears stable  on most recent lab will update this.  Continue to push fluids aggressively.  #3 history coronary artery disease this is been relatively asymptomatic she is on aspirin and the dose was reduced per pharmacy recommendation  recently . #4dementia this appears to be slowly progressing and fairly significant she is doing well with supportive care she is on Namenda--a.  #5 history of arrhythmia--A-fib---this appears stable on current medications including propafenone and Lopressor.  .  #6-history of anemia we will update a CBC this has been stable in the past.  #7 history of GERD this appears stable on Zantac.  #8-history diabetes type 2 she is on low-dose glipizide CBGs appear to be fairly satisfactory-CBG's in the low 100s ----will update hemoglobin A1c.  #9-history COPD this is been relatively stable recently she does have duo-nebs as needed.  #10 history of vitamin D deficiency she is on supplemention  #11-she does have a history of allergic rhinitis this is been relatively asymptomatic recently she is on Zyrtec-  [phone]

## 2014-08-03 ENCOUNTER — Encounter (HOSPITAL_COMMUNITY)
Admission: RE | Admit: 2014-08-03 | Discharge: 2014-08-03 | Disposition: A | Discharge status: Home / Self Care | Payer: Medicare HMO | Source: Skilled Nursing Facility | Attending: Internal Medicine | Admitting: Internal Medicine

## 2014-08-03 DIAGNOSIS — I10 Essential (primary) hypertension: Secondary | ICD-10-CM | POA: Insufficient documentation

## 2014-08-03 DIAGNOSIS — E119 Type 2 diabetes mellitus without complications: Secondary | ICD-10-CM | POA: Diagnosis not present

## 2014-08-03 DIAGNOSIS — I5032 Chronic diastolic (congestive) heart failure: Secondary | ICD-10-CM | POA: Diagnosis not present

## 2014-08-03 LAB — BASIC METABOLIC PANEL
BUN: 37 mg/dL — AB (ref 6–23)
CO2: 22 mmol/L (ref 19–32)
CREATININE: 1.2 mg/dL — AB (ref 0.50–1.10)
Calcium: 8.4 mg/dL (ref 8.4–10.5)
Chloride: 113 mmol/L — ABNORMAL HIGH (ref 96–112)
GFR calc Af Amer: 46 mL/min — ABNORMAL LOW (ref >90)
GFR calc non Af Amer: 39 mL/min — ABNORMAL LOW (ref >90)
Glucose, Bld: 120 mg/dL — ABNORMAL HIGH (ref 70–99)
Potassium: 4.4 mmol/L (ref 3.5–5.1)
Sodium: 135 mmol/L (ref 135–145)

## 2014-08-03 LAB — CBC WITH DIFFERENTIAL/PLATELET
BASOS ABS: 0 10*3/uL (ref 0.0–0.1)
Basophils Relative: 1 % (ref 0–1)
EOS ABS: 0.7 10*3/uL (ref 0.0–0.7)
Eosinophils Relative: 10 % — ABNORMAL HIGH (ref 0–5)
HEMATOCRIT: 32.7 % — AB (ref 36.0–46.0)
Hemoglobin: 10.6 g/dL — ABNORMAL LOW (ref 12.0–15.0)
LYMPHS PCT: 32 % (ref 12–46)
Lymphs Abs: 2.3 10*3/uL (ref 0.7–4.0)
MCH: 28.8 pg (ref 26.0–34.0)
MCHC: 32.4 g/dL (ref 30.0–36.0)
MCV: 88.9 fL (ref 78.0–100.0)
Monocytes Absolute: 0.7 10*3/uL (ref 0.1–1.0)
Monocytes Relative: 10 % (ref 3–12)
Neutro Abs: 3.4 10*3/uL (ref 1.7–7.7)
Neutrophils Relative %: 47 % (ref 43–77)
Platelets: 243 10*3/uL (ref 150–400)
RBC: 3.68 MIL/uL — ABNORMAL LOW (ref 3.87–5.11)
RDW: 14.1 % (ref 11.5–15.5)
WBC: 7.1 10*3/uL (ref 4.0–10.5)

## 2014-08-04 LAB — HEMOGLOBIN A1C
HEMOGLOBIN A1C: 6.5 % — AB (ref 4.8–5.6)
MEAN PLASMA GLUCOSE: 140 mg/dL

## 2014-08-04 LAB — VITAMIN D 25 HYDROXY (VIT D DEFICIENCY, FRACTURES): Vit D, 25-Hydroxy: 30.7 ng/mL (ref 30.0–100.0)

## 2014-08-11 ENCOUNTER — Encounter: Payer: Self-pay | Admitting: Internal Medicine

## 2014-08-11 ENCOUNTER — Non-Acute Institutional Stay (SKILLED_NURSING_FACILITY): Payer: Medicare HMO | Admitting: Internal Medicine

## 2014-08-11 DIAGNOSIS — F0390 Unspecified dementia without behavioral disturbance: Secondary | ICD-10-CM

## 2014-08-11 DIAGNOSIS — J42 Unspecified chronic bronchitis: Secondary | ICD-10-CM

## 2014-08-11 DIAGNOSIS — I482 Chronic atrial fibrillation, unspecified: Secondary | ICD-10-CM

## 2014-08-11 DIAGNOSIS — I10 Essential (primary) hypertension: Secondary | ICD-10-CM | POA: Diagnosis not present

## 2014-08-11 DIAGNOSIS — E119 Type 2 diabetes mellitus without complications: Secondary | ICD-10-CM | POA: Diagnosis not present

## 2014-08-11 DIAGNOSIS — F03C0 Unspecified dementia, severe, without behavioral disturbance, psychotic disturbance, mood disturbance, and anxiety: Secondary | ICD-10-CM

## 2014-08-11 NOTE — Progress Notes (Signed)
Patient ID: [name], female   DOB: [birth year], 81 y.o.   MRN: [id number]   is is a discharge  visit.  Level care skilled.  Facility [facility]  Chief complaint --discharge note .  History of present illness. Patient is a long-standing resident at this facility--apparently will be going to an assisted living facility later today.  She has been a long-term resident here and actually been quite stable recently although her dementia appears to be slowly progressing    About a year ago she did go to the hospital looking clammy was on Cardizem and metoprolol her creatinine was elevated at 1.95-her Cardizem was discontinued she is on Lopressor and has tolerated this well.  She also is a type II diabetic on low-dose glipizide blood sugars appear to be satisfactory in the low 100s hemoglobin A1c also satisfactory most recently 6.5  on lab done on 08/03/2014  She also has had a couple syncopal episodes last year related to bowel movements-this was thought to be possibly vasovagal there've been no recent reoccurrences to my knowledge.  Blood pressures continued to be somewhat variable most recently she is on lisinopril 5 mg a day and Norvasc 10 mg a day and recent list of blood pressures 161/62-156/49-135/53 117/47  She is also on Lopressor.  Family medical social history has been reviewed per previous progressincluding 07/13/2012 and 10/28/2013.--08/02/2014  Medications have been reviewed per [facility] Norvasc 10 mg daily.  Zantac 75 milligrams twice a day.  Surtax 10 mg daily at bedtime.  Aspirin 81 mg daily.  Calcium 500 mg twice a day.  Colace 100 mg twice a day.  Glipizide 2.5 mg daily.  Lisinopril 5 mg daily.  Potassium 10 mEq twice a day.  Vitamin D 2000 units daily.  Propafenone 225 mg twice a day Include Tylenol 650 mg when necessary.  DuoNeb nebulizers when necessary every 6 hours.  Ativan 0.25 mg every 8 hours when necessary.  Milk of magnesia.  .  Review  of systems limited secondary to dementia but per nursing in patient there are no complaints of shortness of breath chest pain or joint pain currently no complaints of syncopal episodes recently.  Physical exam.     is afebrile pulse 70 respirations 19 blood pressures are variable as noted above   General this is a frail elderly female in no distress sitting comfortably in her wheelchair.  Her skin is warm and dry.  Eyes pupils appear equal round reactive to light sclerae and conjunctivae are clear visual acuity appears grossly intact.  Oropharynx is clear mucous membranes moist.  Chest is clear to auscultation with shallow air entry no labored breathing.-Effort is somewhat poor  Heart regular rate and rhythm with some distant heart sounds-she has trace lower extremity edema.  Abdomen continues to be somewhat obese soft nontender positive bowel sounds.  Muscle skeletal moves all extremities 4 I do not see any deformities this appears to be a baseline exam. Neurologic is grossly intact her speech is minimal but clearcranial nerves appear grossly intact.  Psych she is oriented to self does follow simple verbal commands at times.--She is not speaking quite as much today as she did on my previous visit  Labs.  08/03/2014.  Sodium 135 potassium 4.4 BUN 37 creatinine 1.2.  WBC 7.1 hemoglobin 10.6 platelets 243.    05/31/2014.  WBC 7.4 hemoglobin 9.3 platelets 241.  Sodium 135 potassium 4.6 BUN 30 creatinine 1.27  04/24/2014.  Sodium 139 potassium 4.9 BUN 30 creatinine 1.37.  04/20/2014.  WBC 7.9 hemoglobin 9.8 platelets 253.  Liver function tests within normal limits except albumin of 2.8.    02/08/2014.  Sodium 139 potassium 4.6 BUN 22 creatinine 1.17.  Hemoglobin A1c 6.8.  UA 19 2015.  Hemoglobin A1c 6.6 -.  WBC 7.6 hemoglobin 10.6 platelets 222.  Sodium 141 potassium 4.7 BUN 27 creatinine 1.36.   Assessment and plan.  #1history of hypertension  continues to have some variability she is on Lopressor as well as lisinopril and Norvasc --her Norvasc was recently increased this wart follow-up by her primary care provider when she gets to the assisted living facility  #2 history renal insufficiency this has had some variability actually creatinine appears stable  on most recent lab .  #3 history coronary artery disease this is been relatively asymptomatic she is on aspirin and the dose was reduced per pharmacy recommendation recently . #4dementia this appears to be slowly progressing and fairly significant she is doing well with supportive care she is on Namenda--.  #5 history of arrhythmia--A-fib---this appears stable on current medications including propafenone and Lopressor.  .  #6-history of anemia this continues to be stable per recent CBC t.  #7 history of GERD this appears stable on Zantac.  #8-history diabetes type 2 she is on low-dose glipizide CBGs appear to be fairly satisfactory-CBG's in the low 100s ----hemoglobin A1c was satisfactory at 6.5 this was done on 08/03/2014.  #9-history COPD this is been relatively stable recently she does have duo-nebs as needed.  #10 history of vitamin D deficiency she is on supplemention  #11-she does have a history of allergic rhinitis this is been relatively asymptomatic recently she is on Zyrtec-  Again patient will be going to an assisted living facility-clinically she appears stable although the dementia is progressing--she does have family  support  [id number]-of note greater than 30 minutes spent on this discharge summary

## 2014-08-20 DIAGNOSIS — F0280 Dementia in other diseases classified elsewhere without behavioral disturbance: Secondary | ICD-10-CM | POA: Insufficient documentation

## 2014-08-20 DIAGNOSIS — Z7982 Long term (current) use of aspirin: Secondary | ICD-10-CM | POA: Diagnosis not present

## 2014-08-20 DIAGNOSIS — R296 Repeated falls: Secondary | ICD-10-CM | POA: Insufficient documentation

## 2014-08-20 DIAGNOSIS — Z8742 Personal history of other diseases of the female genital tract: Secondary | ICD-10-CM | POA: Diagnosis not present

## 2014-08-20 DIAGNOSIS — Z79899 Other long term (current) drug therapy: Secondary | ICD-10-CM | POA: Diagnosis not present

## 2014-08-20 DIAGNOSIS — K219 Gastro-esophageal reflux disease without esophagitis: Secondary | ICD-10-CM | POA: Diagnosis not present

## 2014-08-20 DIAGNOSIS — G309 Alzheimer's disease, unspecified: Secondary | ICD-10-CM | POA: Diagnosis not present

## 2014-08-20 DIAGNOSIS — Z87440 Personal history of urinary (tract) infections: Secondary | ICD-10-CM | POA: Diagnosis not present

## 2014-08-20 DIAGNOSIS — I10 Essential (primary) hypertension: Secondary | ICD-10-CM | POA: Diagnosis not present

## 2014-08-20 DIAGNOSIS — Z862 Personal history of diseases of the blood and blood-forming organs and certain disorders involving the immune mechanism: Secondary | ICD-10-CM | POA: Diagnosis not present

## 2014-08-20 DIAGNOSIS — R0602 Shortness of breath: Secondary | ICD-10-CM | POA: Diagnosis present

## 2014-08-20 DIAGNOSIS — E119 Type 2 diabetes mellitus without complications: Secondary | ICD-10-CM | POA: Diagnosis not present

## 2014-08-20 DIAGNOSIS — R0989 Other specified symptoms and signs involving the circulatory and respiratory systems: Secondary | ICD-10-CM | POA: Insufficient documentation

## 2014-08-20 NOTE — ED Notes (Signed)
Patient is from [facility] (Sk[facility] Nursing [facility]), after drinking a sip of water, pt became short of breath with coughing and choking. Once patient was placed in the EMS truck, her symptoms improved. She received ALBUTEROL 5 mg in route. [facility], pt is in no resp distress.

## 2014-08-21 ENCOUNTER — Encounter (HOSPITAL_COMMUNITY): Payer: Self-pay | Admitting: Family Medicine

## 2014-08-21 ENCOUNTER — Emergency Department (HOSPITAL_COMMUNITY): Payer: Medicare HMO

## 2014-08-21 ENCOUNTER — Emergency Department (HOSPITAL_COMMUNITY)
Admission: EM | Admit: 2014-08-21 | Discharge: 2014-08-21 | Disposition: A | Discharge status: Skilled Nursing Facility | Payer: Medicare HMO | Attending: Emergency Medicine | Admitting: Emergency Medicine

## 2014-08-21 DIAGNOSIS — R0989 Other specified symptoms and signs involving the circulatory and respiratory systems: Secondary | ICD-10-CM

## 2014-08-21 LAB — URINALYSIS, ROUTINE W REFLEX MICROSCOPIC
Bilirubin Urine: NEGATIVE
GLUCOSE, UA: NEGATIVE mg/dL
Ketones, ur: NEGATIVE mg/dL
NITRITE: NEGATIVE
Protein, ur: 100 mg/dL — AB
SPECIFIC GRAVITY, URINE: 1.021 (ref 1.005–1.030)
Urobilinogen, UA: 0.2 mg/dL (ref 0.0–1.0)
pH: 5 (ref 5.0–8.0)

## 2014-08-21 LAB — CBC WITH DIFFERENTIAL/PLATELET
Basophils Absolute: 0 10*3/uL (ref 0.0–0.1)
Basophils Relative: 0 % (ref 0–1)
EOS ABS: 0.3 10*3/uL (ref 0.0–0.7)
EOS PCT: 3 % (ref 0–5)
HCT: 29.8 % — ABNORMAL LOW (ref 36.0–46.0)
Hemoglobin: 9.7 g/dL — ABNORMAL LOW (ref 12.0–15.0)
Lymphocytes Relative: 16 % (ref 12–46)
Lymphs Abs: 1.7 10*3/uL (ref 0.7–4.0)
MCH: 28.7 pg (ref 26.0–34.0)
MCHC: 32.6 g/dL (ref 30.0–36.0)
MCV: 88.2 fL (ref 78.0–100.0)
MONO ABS: 0.9 10*3/uL (ref 0.1–1.0)
Monocytes Relative: 8 % (ref 3–12)
Neutro Abs: 7.6 10*3/uL (ref 1.7–7.7)
Neutrophils Relative %: 73 % (ref 43–77)
PLATELETS: 229 10*3/uL (ref 150–400)
RBC: 3.38 MIL/uL — ABNORMAL LOW (ref 3.87–5.11)
RDW: 13.9 % (ref 11.5–15.5)
WBC: 10.5 10*3/uL (ref 4.0–10.5)

## 2014-08-21 LAB — I-STAT CHEM 8, ED
BUN: 24 mg/dL — ABNORMAL HIGH (ref 6–23)
Calcium, Ion: 1.35 mmol/L — ABNORMAL HIGH (ref 1.13–1.30)
Chloride: 103 mmol/L (ref 96–112)
Creatinine, Ser: 1.3 mg/dL — ABNORMAL HIGH (ref 0.50–1.10)
Glucose, Bld: 160 mg/dL — ABNORMAL HIGH (ref 70–99)
HEMATOCRIT: 31 % — AB (ref 36.0–46.0)
HEMOGLOBIN: 10.5 g/dL — AB (ref 12.0–15.0)
POTASSIUM: 4.2 mmol/L (ref 3.5–5.1)
SODIUM: 138 mmol/L (ref 135–145)
TCO2: 18 mmol/L (ref 0–100)

## 2014-08-21 LAB — TROPONIN I: Troponin I: 0.03 ng/mL (ref <0.031)

## 2014-08-21 LAB — URINE MICROSCOPIC-ADD ON

## 2014-08-21 MED ORDER — SODIUM CHLORIDE 0.9 % IV BOLUS (SEPSIS)
500.0000 mL | Freq: Once | INTRAVENOUS | Status: AC
  Administered 2014-08-21: 500 mL via INTRAVENOUS

## 2014-08-21 MED ORDER — SODIUM CHLORIDE 0.9 % IV BOLUS (SEPSIS): 1000.0000 mL | Freq: Once | INTRAVENOUS | Status: DC

## 2014-08-21 NOTE — ED Notes (Signed)
Notified [facility] that patient is ready for transportation.

## 2014-08-21 NOTE — ED Notes (Signed)
Bed: [id number] Expected date:  Expected time:  Means of arrival:  Comments: frida Mance in room

## 2014-08-21 NOTE — ED Provider Notes (Signed)
CSN: [id number]     Arrival date & time 08/20/14  2313 History   First MD Initiated Contact with Patient 08/21/14 0040     Chief Complaint  Patient presents with  . Shortness of Breath     (Consider location/radiation/quality/duration/timing/severity/associated sxs/prior Treatment) HPI 81 year old female presents to emergency department via EMS from her nursing facility with complaint of choking episode and shortness of breath.  Patient is back to her baseline.  Family is concerned, however, as they left a Crystal light.  Drink for her on Friday and it was not drank by Sunday.  They report that she is easily dehydrated and frequently has urinary tract infections.  Tonight while there with her.  She had a choking episode after drinking Crystal light.  She complained of shortness of breath afterwards.  Patient received albuterol from EMS.  She denies any shortness of breath at this time. Past Medical History  Diagnosis Date  . Falls frequently   . Cardiac dysrhythmia   . Anemia   . Organic brain syndrome   . Difficulty walking   . Acute renal failure   . Azotemia   . Hyperkalemia   . UTI (lower urinary tract infection)   . Syncope   . Hypertension   . Acid reflux   . Alzheimer disease   . Diabetes mellitus without complication   . UTI (lower urinary tract infection)   . Dysphagia, oropharyngeal phase   . Dementia, unspecified, without behavioral disturbance   . Other specified cardiac dysrhythmias(427.89)    Past Surgical History  Procedure Laterality Date  . Joint replacement      right hip  . Joint replacement      left hip 2012  . Cardiac surgery      stent  . Appendectomy    . Cholecystectomy    . Abdominal hysterectomy     Family History  Problem Relation Age of Onset  . Diabetes Mother   . Dementia Brother   . Dementia Brother   . Fibromyalgia Daughter    History  Substance Use Topics  . Smoking status: Never Smoker   . Smokeless tobacco: Not on file  .  Alcohol Use: No   OB History    No data available     Review of Systems   Level V caveat: Dementia  Allergies  Contrast media; Statins; Sulfa antibiotics; and Vesicare  Home Medications   Prior to Admission medications   Medication Sig Start Date End Date Taking? Authorizing Provider  amLODipine (NORVASC) 10 MG tablet Take 10 mg by mouth daily.   Yes Historical Provider, MD  aspirin 81 MG chewable tablet Chew 81 mg by mouth daily.   Yes Historical Provider, MD  calcium carbonate (OS-CAL - DOSED IN MG OF ELEMENTAL CALCIUM) 1250 (500 CA) MG tablet Take 1 tablet by mouth 2 (two) times daily with a meal.   Yes Historical Provider, MD  cetirizine (ZYRTEC) 10 MG chewable tablet Chew 10 mg by mouth at bedtime.    Yes Historical Provider, MD  Cholecalciferol (VITAMIN D-3) 1000 UNITS CAPS Take 2,000 Units by mouth daily. For vitamin D replacement   Yes Historical Provider, MD  diphenhydrAMINE (BANOPHEN) 25 MG tablet Take 12.5 mg by mouth every 8 (eight) hours as needed (Pruritis).   Yes Historical Provider, MD  docusate sodium (COLACE) 100 MG capsule Take 100 mg by mouth 2 (two) times daily. For constipation   Yes Historical Provider, MD  glipiZIDE (GLUCOTROL XL) 2.5 MG 24 hr tablet  Take 2.5 mg by mouth daily. For DM   Yes Historical Provider, MD  HYDROcodone-acetaminophen (NORCO/VICODIN) 5-325 MG per tablet Take one tablet by mouth every 4 hours as needed for pain Patient taking differently: Take 1 tablet by mouth every 4 (four) hours as needed for moderate pain. Take one tablet by mouth every 4 hours as needed for pain 05/22/14  Yes Tiffany L Reed, DO  ipratropium-albuterol (DUONEB) 0.5-2.5 (3) MG/3ML SOLN Take 3 mLs by nebulization every 6 (six) hours as needed. SOB/Wheezing    Yes Historical Provider, MD  LORazepam (ATIVAN) 0.5 MG tablet Take 1/2 tablet by mouth every 8 hours as needed for anxiety 04/27/14  Yes Sharon Seller, NP  magnesium hydroxide (MILK OF MAGNESIA) 400 MG/5ML  suspension Take 30 mLs by mouth daily as needed for mild constipation.   Yes Historical Provider, MD  metoprolol (LOPRESSOR) 50 MG tablet Take 50 mg by mouth 2 (two) times daily. *HOLD for SBP 100 or PULSE 50* For HTN   Yes Historical Provider, MD  potassium chloride (MICRO-K) 10 MEQ CR capsule Take 10 mEq by mouth 2 (two) times daily.   Yes Historical Provider, MD  propafenone (RYTHMOL) 225 MG tablet Take 225 mg by mouth 2 (two) times daily.     Yes Historical Provider, MD  ranitidine (ZANTAC) 75 MG tablet Take 150 mg by mouth 2 (two) times daily.    Yes Historical Provider, MD   BP 160/129 mmHg  Pulse 74  Temp(Src) 98.3 F (36.8 C) (Oral)  Resp 21  SpO2 94% Physical Exam  Constitutional: She is oriented to person, place, and time. She appears well-developed and well-nourished.  Frail, elderly female, no acute distress  HENT:  Head: Normocephalic and atraumatic.  Nose: Nose normal.  Mouth/Throat: Oropharynx is clear and moist.  Eyes: Conjunctivae and EOM are normal. Pupils are equal, round, and reactive to light.  Neck: Normal range of motion. Neck supple. No JVD present. No tracheal deviation present. No thyromegaly present.  Cardiovascular: Normal rate, regular rhythm, normal heart sounds and intact distal pulses.  Exam reveals no gallop and no friction rub.   No murmur heard. Pulmonary/Chest: Effort normal and breath sounds normal. No stridor. No respiratory distress. She has no wheezes. She has no rales. She exhibits no tenderness.  Abdominal: Soft. Bowel sounds are normal. She exhibits no distension and no mass. There is no tenderness. There is no rebound and no guarding.  Musculoskeletal: Normal range of motion. She exhibits no edema or tenderness.  Lymphadenopathy:    She has no cervical adenopathy.  Neurological: She is alert and oriented to person, place, and time. She displays normal reflexes. She exhibits normal muscle tone. Coordination normal.  Skin: Skin is warm and dry.  No rash noted. No erythema. No pallor.  Psychiatric: She has a normal mood and affect. Her behavior is normal. Judgment and thought content normal.  Nursing note and vitals reviewed.   ED Course  Procedures (including critical care time) Labs Review Labs Reviewed  CBC WITH DIFFERENTIAL/PLATELET - Abnormal; Notable for the following:    RBC 3.38 (*)    Hemoglobin 9.7 (*)    HCT 29.8 (*)    All other components within normal limits  URINALYSIS, ROUTINE W REFLEX MICROSCOPIC - Abnormal; Notable for the following:    APPearance CLOUDY (*)    Hgb urine dipstick SMALL (*)    Protein, ur 100 (*)    Leukocytes, UA MODERATE (*)    All other components within normal  limits  URINE MICROSCOPIC-ADD ON - Abnormal; Notable for the following:    Bacteria, UA MANY (*)    All other components within normal limits  I-STAT CHEM 8, ED - Abnormal; Notable for the following:    BUN 24 (*)    Creatinine, Ser 1.30 (*)    Glucose, Bld 160 (*)    Calcium, Ion 1.35 (*)    Hemoglobin 10.5 (*)    HCT 31.0 (*)    All other components within normal limits  URINE CULTURE  TROPONIN I    Imaging Review Dg Chest 2 View (if Patient Has Fever And/or Copd)  08/21/2014   CLINICAL DATA:  Coughing and choking episode while drinking water  EXAM: CHEST  2 VIEW  COMPARISON:  05/04/2014  FINDINGS: There is mild enlargement of the pulmonary vasculature and mild interstitial thickening, a significant change from 05/04/2014. This likely represents a mild degree of congestive heart failure. There is no alveolar edema. There are no effusions. Heart size is normal and unchanged. Hilar and mediastinal contours are unremarkable and unchanged.  IMPRESSION: Mild CHF with vascular congestion and interstitial fluid. No alveolar edema.   Electronically Signed   By: Ellery Plunk M.D.   On: 08/21/2014 01:19     EKG Interpretation   Date/Time:  Monday August 21 2014 00:48:11 EDT Ventricular Rate:  75 PR Interval:  219 QRS  Duration: 102 QT Interval:  381 QTC Calculation: 425 R Axis:   48 Text Interpretation:  Sinus rhythm Borderline prolonged PR interval Repol  abnrm suggests ischemia, anterolateral poor baseline new ST depressions  anteriolateral compared to 2014 Confirmed by Shariah Assad  MD, Ginette Bradway ([id number]) on  08/21/2014 1:46:38 AM      MDM   Final diagnoses:  Choking episode    81 year old female with choking episode earlier.  Chest x-ray shows mild enlargement of the vasculature and interstitial thickening.  She does not carry a diagnosis of CHF.  No rales heard on exam.  EKG with new ST depressions, negative troponin and last ekg from 2014.  Plan for urinalysis, small fluid bolus and patient should be stable for discharge home    Marisa Severin, MD 08/21/14 [phone]

## 2014-08-21 NOTE — Discharge Instructions (Signed)
Ms. Falwell's urinalysis was sent for culture today.  If she requires antibiotics, she will be contacted.  Increase fluid intake.  Monitor drinking.  She may need further evaluation for swallow study if she continues to have choking episodes.   Choking Choking occurs when a food or object gets stuck in the throat or trachea, blocking the airway. If the airway is partly blocked, coughing will usually cause the food or object to come out. If the airway is completely blocked, immediate action is needed to help it come out. A complete airway blockage is life threatening because it causes breathing to stop. Choking is a true medical emergency that requires fast, appropriate action by anyone available. SIGNS OF AIRWAY BLOCKAGE There is a partial airway blockage if you or the person who is choking is:   Able to breathe and speak.  Coughing loudly.  Making loud noises. There is a complete airway blockage if you or the person who is choking is:   Unable to breathe.  Making soft or high-pitched sounds while breathing.  Unable to cough or coughing weakly, ineffectively, or silently.  Unable to cry, speak, or make sounds.  Turning blue.  Holding the neck with both arms. This is the universal sign of choking. WHAT TO DO IF CHOKING OCCURS If there is a partial airway blockage, allow coughing to clear the airway. Do not try to drink until the food or object comes out. If someone else has a partial airway blockage, do not interfere. Stay with him or her and watch for signs of complete airway blockage until the food or object comes out.  If there is a complete airway blockage or if there is a partial airway blockage and the food or object does not come out, perform abdominal thrusts (also referred to as the Heimlich maneuver). Abdominal thrusts are used to create an artificial cough to try to clear the airway. Performing abdominal thrusts is part of a series of steps that should be done to help someone who  is choking. Abdominal thrusts are usually done by someone else, but if you are alone, you can perform abdominal thrusts on yourself. Follow the procedure below that best fits your situation.  IF SOMEONE ELSE IS CHOKING: For a conscious adult:   Ask the person whether he or she is choking. If the person nods, continue to step 2.  Stand or kneel behind the person and lean him or her forward slightly.  Make a fist with 1 hand, put your arms around the person, and grasp your fist with your other hand. Place the thumb side of your fist in the person's abdomen, just below the ribs.  Press inward and upward with both hands.  Repeat this maneuver until the object comes out and the person is able to breathe or until the person loses consciousness. For an unconscious adult: 1. Shout for help. If someone responds, have him or her call local emergency services (911 in [place] If no one responds, call local emergency services yourself if possible. 2. Begin CPR, starting with compressions. Every time you open the airway to give rescue breaths, open the person's mouth. If you can see the food or object and it can be easily pulled out, remove it with your fingers. 3. After 5 cycles or 2 minutes of CPR, call local emergency services (911 in [place] if you or someone else did not already call. For a conscious adult who is obese or in the later stages of pregnancy: Abdominal thrusts  may not be effective when helping people who are in the later stages of pregnancy or who are obese. In these instances, chest thrusts can be used.  1. Ask the person whether he or she is choking. If the person nods and has signs of complete airway blockage, continue to step 2. 2. Stand behind the person and wrap your arms around his or her chest (with your arms under the person's armpits). 3. Make a fist with 1 hand. Place the thumb side of your fist on the middle of the person's breastbone. 4. Grab your fist with your other hand and  thrust backward. Continue this until the object comes out or until the person becomes unconscious. For an unconscious adult who is obese or in the later stages of pregnancy:  1. Shout for help. If someone responds, have him or her call local emergency services (911 in [place] If no one responds, call local emergency services yourself if possible. 2. Begin CPR, starting with compressions. Every time you open the airway to give rescue breaths, open the person's mouth. If you can see the food or object and it can be easily pulled out, remove it with your fingers. 3. After 5 cycles or 2 minutes of CPR, call local emergency services (911 in [place] if you or someone else did not already call. Note that abdominal thrusts (below the rib cage) should be used for a pregnant woman if possible. This should be possible until the later stages of pregnancy when there is no longer enough room between the enlarging uterus and the rib cage to perform the maneuver. At that point, chest thrusts must be used as described. IF YOU ARE CHOKING: 1. Call local emergency services (911 in [place] if near a landline. Do not worry about communicating what is happening. Do not hang up the phone. Someone may be sent to help you anyway. 2. Make a fist with 1 hand. Put the thumb side of the fist against your stomach, just above the belly button and well below the breastbone. If you are pregnant or obese, put your fist on your chest instead, just below the breastbone and just above your lowest ribs. 3. Hold your fist with your other hand and bend over a hard surface, such as a table or chair. 4. Forcefully push your fist in and up. 5. Continue to do this until the food or object comes out. PREVENTION  To be prepared if choking occurs, learn how to correctly perform abdominal thrusts and give CPR by taking a certified first-aid training course.  SEEK IMMEDIATE MEDICAL CARE IF:  You have a fever after choking stops.  You have problems  breathing after choking stops.  You received the Heimlich maneuver. MAKE SURE YOU:  Understand these instructions.  Watch your condition.  Get help right away if you are not doing well or get worse. Document Released: 07/03/2004 Document Revised: 10/10/2013 Document Reviewed: 01/06/2012 [facility] Patient Information 2015 [place]. This information is not intended to replace advice given to you by your health care provider. Make sure you discuss any questions you have with your health care provider.  Shortness of Breath Shortness of breath means you have trouble breathing. Shortness of breath needs medical care right away. HOME CARE   Do not smoke.  Avoid being around chemicals or things (paint fumes, dust) that may bother your breathing.  Rest as needed. Slowly begin your normal activities.  Only take medicines as told by your doctor.  Keep all doctor  visits as told. GET HELP RIGHT AWAY IF:   Your shortness of breath gets worse.  You feel lightheaded, pass out (faint), or have a cough that is not helped by medicine.  You cough up blood.  You have pain with breathing.  You have pain in your chest, arms, shoulders, or belly (abdomen).  You have a fever.  You cannot walk up stairs or exercise the way you normally do.  You do not get better in the time expected.  You have a hard time doing normal activities even with rest.  You have problems with your medicines.  You have any new symptoms. MAKE SURE YOU:  Understand these instructions.  Will watch your condition.  Will get help right away if you are not doing well or get worse. Document Released: 11/12/2007 Document Revised: 05/31/2013 Document Reviewed: 08/11/2011 [facility] Patient Information 2015 [place], [place]. This information is not intended to replace advice given to you by your health care provider. Make sure you discuss any questions you have with your health care provider.

## 2014-08-22 LAB — URINE CULTURE: Colony Count: 80000

## 2014-08-28 ENCOUNTER — Inpatient Hospital Stay (HOSPITAL_COMMUNITY)
Admission: EM | Admit: 2014-08-28 | Discharge: 2014-09-03 | DRG: 280 | Disposition: A | Discharge status: Hospice | Payer: Medicare HMO | Attending: Internal Medicine | Admitting: Internal Medicine

## 2014-08-28 ENCOUNTER — Other Ambulatory Visit (HOSPITAL_COMMUNITY): Payer: Self-pay

## 2014-08-28 ENCOUNTER — Encounter (HOSPITAL_COMMUNITY): Payer: Self-pay | Admitting: Emergency Medicine

## 2014-08-28 ENCOUNTER — Emergency Department (HOSPITAL_COMMUNITY): Payer: Medicare HMO

## 2014-08-28 DIAGNOSIS — K219 Gastro-esophageal reflux disease without esophagitis: Secondary | ICD-10-CM | POA: Diagnosis present

## 2014-08-28 DIAGNOSIS — I480 Paroxysmal atrial fibrillation: Secondary | ICD-10-CM | POA: Diagnosis present

## 2014-08-28 DIAGNOSIS — R0602 Shortness of breath: Secondary | ICD-10-CM | POA: Diagnosis present

## 2014-08-28 DIAGNOSIS — I129 Hypertensive chronic kidney disease with stage 1 through stage 4 chronic kidney disease, or unspecified chronic kidney disease: Secondary | ICD-10-CM | POA: Diagnosis present

## 2014-08-28 DIAGNOSIS — N189 Chronic kidney disease, unspecified: Secondary | ICD-10-CM

## 2014-08-28 DIAGNOSIS — R7989 Other specified abnormal findings of blood chemistry: Secondary | ICD-10-CM | POA: Diagnosis not present

## 2014-08-28 DIAGNOSIS — I214 Non-ST elevation (NSTEMI) myocardial infarction: Secondary | ICD-10-CM | POA: Diagnosis present

## 2014-08-28 DIAGNOSIS — N179 Acute kidney failure, unspecified: Secondary | ICD-10-CM

## 2014-08-28 DIAGNOSIS — B964 Proteus (mirabilis) (morganii) as the cause of diseases classified elsewhere: Secondary | ICD-10-CM | POA: Diagnosis present

## 2014-08-28 DIAGNOSIS — E870 Hyperosmolality and hypernatremia: Secondary | ICD-10-CM | POA: Diagnosis present

## 2014-08-28 DIAGNOSIS — E876 Hypokalemia: Secondary | ICD-10-CM | POA: Diagnosis present

## 2014-08-28 DIAGNOSIS — F03C0 Unspecified dementia, severe, without behavioral disturbance, psychotic disturbance, mood disturbance, and anxiety: Secondary | ICD-10-CM | POA: Diagnosis present

## 2014-08-28 DIAGNOSIS — J9600 Acute respiratory failure, unspecified whether with hypoxia or hypercapnia: Secondary | ICD-10-CM

## 2014-08-28 DIAGNOSIS — Z66 Do not resuscitate: Secondary | ICD-10-CM | POA: Diagnosis present

## 2014-08-28 DIAGNOSIS — J9601 Acute respiratory failure with hypoxia: Secondary | ICD-10-CM | POA: Diagnosis present

## 2014-08-28 DIAGNOSIS — F0390 Unspecified dementia without behavioral disturbance: Secondary | ICD-10-CM | POA: Diagnosis not present

## 2014-08-28 DIAGNOSIS — G309 Alzheimer's disease, unspecified: Secondary | ICD-10-CM | POA: Diagnosis present

## 2014-08-28 DIAGNOSIS — Z96643 Presence of artificial hip joint, bilateral: Secondary | ICD-10-CM | POA: Diagnosis present

## 2014-08-28 DIAGNOSIS — E119 Type 2 diabetes mellitus without complications: Secondary | ICD-10-CM | POA: Diagnosis present

## 2014-08-28 DIAGNOSIS — R1310 Dysphagia, unspecified: Secondary | ICD-10-CM | POA: Diagnosis present

## 2014-08-28 DIAGNOSIS — N390 Urinary tract infection, site not specified: Secondary | ICD-10-CM | POA: Diagnosis present

## 2014-08-28 DIAGNOSIS — F0280 Dementia in other diseases classified elsewhere without behavioral disturbance: Secondary | ICD-10-CM | POA: Diagnosis present

## 2014-08-28 DIAGNOSIS — J690 Pneumonitis due to inhalation of food and vomit: Secondary | ICD-10-CM | POA: Diagnosis present

## 2014-08-28 DIAGNOSIS — N183 Chronic kidney disease, stage 3 (moderate): Secondary | ICD-10-CM | POA: Diagnosis present

## 2014-08-28 DIAGNOSIS — R778 Other specified abnormalities of plasma proteins: Secondary | ICD-10-CM

## 2014-08-28 DIAGNOSIS — Z833 Family history of diabetes mellitus: Secondary | ICD-10-CM

## 2014-08-28 DIAGNOSIS — E43 Unspecified severe protein-calorie malnutrition: Secondary | ICD-10-CM | POA: Diagnosis present

## 2014-08-28 DIAGNOSIS — I509 Heart failure, unspecified: Secondary | ICD-10-CM | POA: Diagnosis not present

## 2014-08-28 DIAGNOSIS — I5033 Acute on chronic diastolic (congestive) heart failure: Secondary | ICD-10-CM | POA: Diagnosis present

## 2014-08-28 DIAGNOSIS — Z515 Encounter for palliative care: Secondary | ICD-10-CM

## 2014-08-28 DIAGNOSIS — Z993 Dependence on wheelchair: Secondary | ICD-10-CM | POA: Diagnosis not present

## 2014-08-28 LAB — BLOOD GAS, ARTERIAL
Acid-base deficit: 6 mmol/L — ABNORMAL HIGH (ref 0.0–2.0)
Bicarbonate: 17.6 mEq/L — ABNORMAL LOW (ref 20.0–24.0)
Drawn by: 257701
O2 Content: 6 L/min
O2 Saturation: 91.4 %
PATIENT TEMPERATURE: 98.6
TCO2: 16.2 mmol/L (ref 0–100)
pCO2 arterial: 30.1 mmHg — ABNORMAL LOW (ref 35.0–45.0)
pH, Arterial: 7.384 (ref 7.350–7.450)
pO2, Arterial: 68.1 mmHg — ABNORMAL LOW (ref 80.0–100.0)

## 2014-08-28 LAB — COMPREHENSIVE METABOLIC PANEL
ALBUMIN: 3.4 g/dL — AB (ref 3.5–5.2)
ALBUMIN: 2.9 g/dL — AB (ref 3.5–5.2)
ALK PHOS: 159 U/L — AB (ref 39–117)
ALT: 18 U/L (ref 0–35)
ALT: 17 U/L (ref 0–35)
ANION GAP: 9 (ref 5–15)
AST: 21 U/L (ref 0–37)
AST: 22 U/L (ref 0–37)
Alkaline Phosphatase: 145 U/L — ABNORMAL HIGH (ref 39–117)
Anion gap: 14 (ref 5–15)
BILIRUBIN TOTAL: 0.4 mg/dL (ref 0.3–1.2)
BUN: 60 mg/dL — ABNORMAL HIGH (ref 6–23)
BUN: 58 mg/dL — AB (ref 6–23)
CHLORIDE: 114 mmol/L — AB (ref 96–112)
CO2: 18 mmol/L — ABNORMAL LOW (ref 19–32)
CO2: 19 mmol/L (ref 19–32)
Calcium: 9.1 mg/dL (ref 8.4–10.5)
Calcium: 8.3 mg/dL — ABNORMAL LOW (ref 8.4–10.5)
Chloride: 114 mmol/L — ABNORMAL HIGH (ref 96–112)
Creatinine, Ser: 1.96 mg/dL — ABNORMAL HIGH (ref 0.50–1.10)
Creatinine, Ser: 1.96 mg/dL — ABNORMAL HIGH (ref 0.50–1.10)
GFR calc Af Amer: 25 mL/min — ABNORMAL LOW (ref >90)
GFR calc Af Amer: 25 mL/min — ABNORMAL LOW (ref >90)
GFR calc non Af Amer: 22 mL/min — ABNORMAL LOW (ref >90)
GFR calc non Af Amer: 22 mL/min — ABNORMAL LOW (ref >90)
Glucose, Bld: 195 mg/dL — ABNORMAL HIGH (ref 70–99)
Glucose, Bld: 217 mg/dL — ABNORMAL HIGH (ref 70–99)
POTASSIUM: 4.9 mmol/L (ref 3.5–5.1)
Potassium: 4.5 mmol/L (ref 3.5–5.1)
SODIUM: 146 mmol/L — AB (ref 135–145)
SODIUM: 142 mmol/L (ref 135–145)
TOTAL PROTEIN: 7.7 g/dL (ref 6.0–8.3)
TOTAL PROTEIN: 6.9 g/dL (ref 6.0–8.3)
Total Bilirubin: 0.6 mg/dL (ref 0.3–1.2)

## 2014-08-28 LAB — CBC WITH DIFFERENTIAL/PLATELET
BASOS ABS: 0 10*3/uL (ref 0.0–0.1)
BASOS PCT: 0 % (ref 0–1)
Eosinophils Absolute: 0 10*3/uL (ref 0.0–0.7)
Eosinophils Relative: 0 % (ref 0–5)
HCT: 35.9 % — ABNORMAL LOW (ref 36.0–46.0)
HEMOGLOBIN: 11.4 g/dL — AB (ref 12.0–15.0)
LYMPHS ABS: 1.1 10*3/uL (ref 0.7–4.0)
Lymphocytes Relative: 10 % — ABNORMAL LOW (ref 12–46)
MCH: 28.4 pg (ref 26.0–34.0)
MCHC: 31.8 g/dL (ref 30.0–36.0)
MCV: 89.5 fL (ref 78.0–100.0)
MONOS PCT: 7 % (ref 3–12)
Monocytes Absolute: 0.8 10*3/uL (ref 0.1–1.0)
Neutro Abs: 9.1 10*3/uL — ABNORMAL HIGH (ref 1.7–7.7)
Neutrophils Relative %: 83 % — ABNORMAL HIGH (ref 43–77)
PLATELETS: 376 10*3/uL (ref 150–400)
RBC: 4.01 MIL/uL (ref 3.87–5.11)
RDW: 14.6 % (ref 11.5–15.5)
WBC: 10.9 10*3/uL — ABNORMAL HIGH (ref 4.0–10.5)

## 2014-08-28 LAB — MRSA PCR SCREENING: MRSA by PCR: NEGATIVE

## 2014-08-28 LAB — URINALYSIS, ROUTINE W REFLEX MICROSCOPIC
GLUCOSE, UA: NEGATIVE mg/dL
KETONES UR: NEGATIVE mg/dL
Nitrite: NEGATIVE
PH: 7 (ref 5.0–8.0)
Protein, ur: >300 mg/dL — AB
Specific Gravity, Urine: 1.024 (ref 1.005–1.030)
Urobilinogen, UA: 1 mg/dL (ref 0.0–1.0)

## 2014-08-28 LAB — PROCALCITONIN: PROCALCITONIN: 0.32 ng/mL

## 2014-08-28 LAB — I-STAT CG4 LACTIC ACID, ED
LACTIC ACID, VENOUS: 1.85 mmol/L (ref 0.5–2.0)
LACTIC ACID, VENOUS: 1.29 mmol/L (ref 0.5–2.0)

## 2014-08-28 LAB — I-STAT TROPONIN, ED: TROPONIN I, POC: 1.9 ng/mL — AB (ref 0.00–0.08)

## 2014-08-28 LAB — URINE MICROSCOPIC-ADD ON

## 2014-08-28 LAB — GLUCOSE, CAPILLARY
GLUCOSE-CAPILLARY: 171 mg/dL — AB (ref 70–99)
Glucose-Capillary: 172 mg/dL — ABNORMAL HIGH (ref 70–99)

## 2014-08-28 LAB — BRAIN NATRIURETIC PEPTIDE: B Natriuretic Peptide: 1992.3 pg/mL — ABNORMAL HIGH (ref 0.0–100.0)

## 2014-08-28 LAB — TROPONIN I
Troponin I: 4.08 ng/mL (ref <0.031)
Troponin I: 5.52 ng/mL (ref <0.031)

## 2014-08-28 MED ORDER — VANCOMYCIN HCL IN DEXTROSE 1-5 GM/200ML-% IV SOLN
1000.0000 mg | Freq: Once | INTRAVENOUS | Status: AC
  Administered 2014-08-28: 1000 mg via INTRAVENOUS
  Filled 2014-08-28: qty 200

## 2014-08-28 MED ORDER — SODIUM CHLORIDE 0.9 % IV SOLN
INTRAVENOUS | Status: DC
  Administered 2014-08-28 – 2014-08-31 (×2): via INTRAVENOUS

## 2014-08-28 MED ORDER — ASPIRIN 300 MG RE SUPP
300.0000 mg | Freq: Once | RECTAL | Status: AC
  Administered 2014-08-28: 300 mg via RECTAL
  Filled 2014-08-28: qty 1

## 2014-08-28 MED ORDER — SODIUM CHLORIDE 0.9 % IJ SOLN
3.0000 mL | Freq: Two times a day (BID) | INTRAMUSCULAR | Status: DC
  Administered 2014-08-28 – 2014-09-03 (×13): 3 mL via INTRAVENOUS

## 2014-08-28 MED ORDER — FUROSEMIDE 10 MG/ML IJ SOLN
40.0000 mg | Freq: Once | INTRAMUSCULAR | Status: AC
  Administered 2014-08-28: 40 mg via INTRAVENOUS
  Filled 2014-08-28: qty 4

## 2014-08-28 MED ORDER — PIPERACILLIN-TAZOBACTAM 3.375 G IVPB
3.3750 g | Freq: Three times a day (TID) | INTRAVENOUS | Status: DC
  Administered 2014-08-28 – 2014-08-29 (×3): 3.375 g via INTRAVENOUS
  Filled 2014-08-28 (×2): qty 50

## 2014-08-28 MED ORDER — DILTIAZEM HCL 25 MG/5ML IV SOLN
10.0000 mg | Freq: Once | INTRAVENOUS | Status: AC
  Administered 2014-08-28: 10 mg via INTRAVENOUS
  Filled 2014-08-28: qty 5

## 2014-08-28 MED ORDER — IPRATROPIUM-ALBUTEROL 0.5-2.5 (3) MG/3ML IN SOLN: 3.0000 mL | Freq: Four times a day (QID) | RESPIRATORY_TRACT | Status: DC | PRN

## 2014-08-28 MED ORDER — LIP MEDEX EX OINT
TOPICAL_OINTMENT | CUTANEOUS | Status: AC
  Administered 2014-08-28: 16:00:00
  Filled 2014-08-28: qty 7

## 2014-08-28 MED ORDER — SODIUM CHLORIDE 0.9 % IJ SOLN: 3.0000 mL | INTRAMUSCULAR | Status: DC | PRN

## 2014-08-28 MED ORDER — INSULIN ASPART 100 UNIT/ML ~~LOC~~ SOLN
0.0000 [IU] | Freq: Three times a day (TID) | SUBCUTANEOUS | Status: DC
  Administered 2014-08-28 – 2014-08-29 (×2): 2 [IU] via SUBCUTANEOUS
  Administered 2014-08-29 (×2): 1 [IU] via SUBCUTANEOUS
  Administered 2014-08-30 (×2): 2 [IU] via SUBCUTANEOUS
  Administered 2014-08-30: 1 [IU] via SUBCUTANEOUS
  Administered 2014-08-31 (×2): 2 [IU] via SUBCUTANEOUS

## 2014-08-28 MED ORDER — DILTIAZEM HCL 100 MG IV SOLR
5.0000 mg/h | INTRAVENOUS | Status: DC
  Administered 2014-08-28: 5 mg/h via INTRAVENOUS
  Filled 2014-08-28: qty 100

## 2014-08-28 MED ORDER — CEFEPIME HCL 2 G IJ SOLR
2.0000 g | Freq: Once | INTRAMUSCULAR | Status: AC
  Administered 2014-08-28: 2 g via INTRAVENOUS
  Filled 2014-08-28: qty 2

## 2014-08-28 MED ORDER — ASPIRIN 300 MG RE SUPP
300.0000 mg | Freq: Every day | RECTAL | Status: DC
  Administered 2014-08-29 – 2014-08-31 (×3): 300 mg via RECTAL
  Filled 2014-08-28 (×3): qty 1

## 2014-08-28 MED ORDER — DEXTROSE 5 % IV SOLN
5.0000 mg/h | INTRAVENOUS | Status: DC
  Administered 2014-08-29: 5 mg/h via INTRAVENOUS
  Administered 2014-08-29: 10 mg/h via INTRAVENOUS
  Administered 2014-08-29: 7.5 mg/h via INTRAVENOUS
  Administered 2014-08-30 – 2014-08-31 (×5): 15 mg/h via INTRAVENOUS
  Filled 2014-08-28: qty 100

## 2014-08-28 MED ORDER — FAMOTIDINE IN NACL 20-0.9 MG/50ML-% IV SOLN
20.0000 mg | INTRAVENOUS | Status: DC
  Administered 2014-08-28 – 2014-08-30 (×3): 20 mg via INTRAVENOUS
  Filled 2014-08-28 (×4): qty 50

## 2014-08-28 MED ORDER — VANCOMYCIN HCL IN DEXTROSE 1-5 GM/200ML-% IV SOLN: 1000.0000 mg | INTRAVENOUS | Status: DC

## 2014-08-28 MED ORDER — METOPROLOL TARTRATE 25 MG PO TABS
25.0000 mg | ORAL_TABLET | Freq: Two times a day (BID) | ORAL | Status: DC
  Administered 2014-08-28: 25 mg via ORAL
  Filled 2014-08-28 (×2): qty 1

## 2014-08-28 MED ORDER — ENOXAPARIN SODIUM 30 MG/0.3ML ~~LOC~~ SOLN
30.0000 mg | SUBCUTANEOUS | Status: DC
  Administered 2014-08-28 – 2014-08-31 (×4): 30 mg via SUBCUTANEOUS
  Filled 2014-08-28 (×4): qty 0.3

## 2014-08-28 MED ORDER — SODIUM CHLORIDE 0.9 % IV BOLUS (SEPSIS)
500.0000 mL | INTRAVENOUS | Status: DC
  Administered 2014-08-28: 500 mL via INTRAVENOUS

## 2014-08-28 MED ORDER — ONDANSETRON HCL 4 MG/2ML IJ SOLN: 4.0000 mg | Freq: Four times a day (QID) | INTRAMUSCULAR | Status: DC | PRN

## 2014-08-28 MED ORDER — ACETAMINOPHEN 325 MG PO TABS: 650.0000 mg | ORAL_TABLET | ORAL | Status: DC | PRN

## 2014-08-28 MED ORDER — SODIUM CHLORIDE 0.9 % IV SOLN
250.0000 mL | INTRAVENOUS | Status: DC | PRN
  Administered 2014-08-28: 500 mL via INTRAVENOUS

## 2014-08-28 MED ORDER — DEXTROSE 5 % IV SOLN: 2.0000 g | INTRAVENOUS | Status: DC

## 2014-08-28 MED ORDER — PROPAFENONE HCL 225 MG PO TABS
225.0000 mg | ORAL_TABLET | Freq: Two times a day (BID) | ORAL | Status: DC
  Administered 2014-08-28: 225 mg via ORAL
  Filled 2014-08-28 (×3): qty 1

## 2014-08-28 MED ORDER — SODIUM CHLORIDE 0.9 % IV BOLUS (SEPSIS)
1000.0000 mL | INTRAVENOUS | Status: DC
  Administered 2014-08-28: 1000 mL via INTRAVENOUS

## 2014-08-28 MED ORDER — FUROSEMIDE 10 MG/ML IJ SOLN
40.0000 mg | Freq: Two times a day (BID) | INTRAMUSCULAR | Status: DC
  Administered 2014-08-28 – 2014-08-30 (×4): 40 mg via INTRAVENOUS
  Filled 2014-08-28 (×4): qty 4

## 2014-08-28 NOTE — Progress Notes (Signed)
Clinical Social Work Department BRIEF PSYCHOSOCIAL ASSESSMENT 08/28/2014  Patient:  [name],[name]     Account Number:  [id number]     Admit date:  08/28/2014  Clinical Social Worker:  Keigen Caddell Deon PillingStewart, LCSWA  Date/Time:  08/28/2014 02:43 PM  Referred by:  CSW  Date Referred:  08/28/2014 Referred for  ALF Placement   Other Referral:   Interview type:  Family Other interview type:   daughter, [name]    PSYCHOSOCIAL DATA Living Status:  FACILITY Admitted from facility:  Other Level of care:  Assisted Living Primary support name:  [name] [phone], [phone] Primary support relationship to patient:  CHILD, ADULT Degree of support available:   strong    CURRENT CONCERNS Current Concerns  Post-Acute Placement   Other Concerns:    SOCIAL WORK ASSESSMENT / PLAN CSW attempted to complete psychosocial assessment wth patient, however patient with advanced dementia and only able to speak 1-2 words and does not follow cues. CSW compelted psychosocial assessment with assistance from pt daughter, [name]. pt daughter shares that patient is [name] [address], in alzheimer's unit. Patient daughter plans for [facility]. Patient daughter shares that eventually she hopes to move patient closer to family in [place], however at this time patient family hopes for patient to [facility] once medically stbale.    Pt daugher voiced concerns that patient may not be able to manage [name] move and appeared to be quite heart broken. Patient daughter states, "I hope her hosptial stay doesn't hurt her case to be closer." CSW provided supportive counseling.   Assessment/plan status:  Psychosocial Support/Ongoing Assessment of Needs Other assessment/ plan:   Information/referral to community resources:   none identified at this time    PATIENT'S/FAMILY'S RESPONSE TO PLAN OF CARE: Patient daughter thanked  csw for concern and support. Pt daughter plans for [facility] to [facility]. Patient daughter is hoepful that once pt is medically stable eventually can transition closer to family in [place].       [name], LCSW  Clinical Social Work  [facility] [phone]

## 2014-08-28 NOTE — ED Notes (Signed)
EDP made aware of patient I-Stat troponin result.

## 2014-08-28 NOTE — H&P (Signed)
History and Physical  [name]:[id number] DOB: Jul 04, 1924 DOA: 08/28/2014   PCP: Terald Sleeper, MD   Chief Complaint: increase WOB  HPI:  81 year old female with history of dementia, atrial fibrillation, CKD stage III, diabetes mellitus, and hypertension presented from [facility] memory care unit with increased work of breathing. At baseline, the patient is only able to speak 1 or 2 words, and she is wheelchair bound. Approximately one week prior to this admission, the patient came to the emergency department for choking episode. She was discharged from the emergency department stable condition. The patient was noted to have increased work of breathing on the morning of this admission. There's been no reports of vomiting, diarrhea, fevers, uncontrolled pain. In the emergency department, workup revealed serum creatinine 1.96, WBC 10.9. EKG showed atrial fibrillation with RVR. BNP was 1992. Chest x-ray showed pulmonary edema. ABG showed 7.38/30/68/17 on 6 L. The patient was initially thought to be septic and started on vancomycin and cefepime in the emergency department after blood cultures were obtained. She was also noted to have an point-of-care elevated troponin of 1.90. Lactic acid was 1.5. Assessment/Plan: Acute on chronic diastolic CHF -Patient is clinically fluid overloaded -Start intravenous furosemide 40 mg IV twice a day -Daily weights -Echocardiogram Pulmonary infiltrates/Acute Respiratory Failure -Certainly with the patient's clinical history of choking on her food she may have a component of aspiration contributing to the patient's chest x-ray findings  -Start Zosyn for aspiration pneumonitis  -Swallow evaluation  -Procalcitonin  -dysphagia 2 diet for now acute on chronic renal failure CKD stage III -Baseline creatinine 1.0-1.4  -Monitor renal function with diuresis  Atrial fibrillation with RVR  -Start diltiazem drip after diltiazem bolus  -Continue  propafenone  -Continue metoprolol tartrate at slightly lower dose and titrate as needed  -Continue aspirin supp for now -TSH Elevated point-of-care troponin  -Certainly this may be spurious -repeat serum troponin -If elevated, the patient may certainly have demand ischemia versus  NSTEMI -discussed with pt's daughter (POA)--the patient would not want heart catheterization, she would want the patient to be treated medically and requested cardiology evaluation  diabetes mellitus type 2  -Hemoglobin A1c  -Sensitive sliding scale  -Allow for liberal glycemic control  Dementia -at baseline per daughter      Past Medical History  Diagnosis Date  . Falls frequently   . Cardiac dysrhythmia   . Anemia   . Organic brain syndrome   . Difficulty walking   . Acute renal failure   . Azotemia   . Hyperkalemia   . UTI (lower urinary tract infection)   . Syncope   . Hypertension   . Acid reflux   . Alzheimer disease   . Diabetes mellitus without complication   . UTI (lower urinary tract infection)   . Dysphagia, oropharyngeal phase   . Dementia, unspecified, without behavioral disturbance   . Other specified cardiac dysrhythmias(427.89)    Past Surgical History  Procedure Laterality Date  . Joint replacement      right hip  . Joint replacement      left hip 2012  . Cardiac surgery      stent  . Appendectomy    . Cholecystectomy    . Abdominal hysterectomy     Social History:  reports that she has never smoked. She has never used smokeless tobacco. She reports that she does not drink alcohol or use illicit drugs.   Family History  Problem Relation Age of Onset  .  Diabetes Mother   . Dementia Brother   . Dementia Brother   . Fibromyalgia Daughter      Allergies  Allergen Reactions  . Contrast Media [Iodinated Diagnostic Agents] Other (See Comments)    MAR  . Statins   . Sulfa Antibiotics Hives  . Vesicare [Solifenacin] Other (See Comments)    MAR      Prior  to Admission medications   Medication Sig Start Date End Date Taking? Authorizing Provider  acetaminophen (TYLENOL) 325 MG tablet Take 650 mg by mouth as needed for mild pain or moderate pain.   Yes Historical Provider, MD  amLODipine (NORVASC) 10 MG tablet Take 10 mg by mouth daily.   Yes Historical Provider, MD  aspirin 81 MG chewable tablet Chew 81 mg by mouth daily.   Yes Historical Provider, MD  Calcium 500 MG tablet Take 500 mg by mouth 2 (two) times daily.   Yes Historical Provider, MD  cetirizine (ZYRTEC) 10 MG chewable tablet Chew 10 mg by mouth at bedtime.    Yes Historical Provider, MD  Cholecalciferol (VITAMIN D-3) 1000 UNITS CAPS Take 2,000 Units by mouth daily. For vitamin D replacement   Yes Historical Provider, MD  diphenhydrAMINE (BANOPHEN) 25 MG tablet Take 12.5 mg by mouth every 8 (eight) hours as needed (Pruritis).   Yes Historical Provider, MD  docusate sodium (COLACE) 100 MG capsule Take 100 mg by mouth 2 (two) times daily. For constipation   Yes Historical Provider, MD  glipiZIDE (GLUCOTROL XL) 2.5 MG 24 hr tablet Take 2.5 mg by mouth daily. For DM   Yes Historical Provider, MD  HYDROcodone-acetaminophen (NORCO/VICODIN) 5-325 MG per tablet Take one tablet by mouth every 4 hours as needed for pain Patient taking differently: Take 1 tablet by mouth every 4 (four) hours as needed for moderate pain. Take one tablet by mouth every 4 hours as needed for pain 05/22/14  Yes Tiffany L Reed, DO  ipratropium-albuterol (DUONEB) 0.5-2.5 (3) MG/3ML SOLN Take 3 mLs by nebulization every 6 (six) hours as needed. SOB/Wheezing    Yes Historical Provider, MD  LORazepam (ATIVAN) 0.5 MG tablet Take 1/2 tablet by mouth every 8 hours as needed for anxiety Patient taking differently: Take 0.25 mg by mouth every 8 (eight) hours as needed for anxiety. Take 1/2 tablet by mouth every 8 hours as needed for anxiety 04/27/14  Yes Sharon Seller, NP  metoprolol (LOPRESSOR) 50 MG tablet Take 50 mg by mouth  2 (two) times daily. *HOLD for SBP 100 or PULSE 50* For HTN   Yes Historical Provider, MD  potassium chloride (MICRO-K) 10 MEQ CR capsule Take 10 mEq by mouth 2 (two) times daily.   Yes Historical Provider, MD  propafenone (RYTHMOL) 225 MG tablet Take 225 mg by mouth 2 (two) times daily.     Yes Historical Provider, MD  ranitidine (ZANTAC) 75 MG tablet Take 150 mg by mouth 2 (two) times daily.    Yes Historical Provider, MD    Review of Systems:  Unobtainable secondary to patient's mental status   Physical Exam: Filed Vitals:   08/28/14 1245 08/28/14 1315 08/28/14 1330 08/28/14 1345  BP: 121/61 133/82 129/68 125/63  Pulse: 115 132 116 106  Temp:      TempSrc:      Resp: 34 34 35 35  Weight:      SpO2: 94% 94% 95% 93%   General:  awake, NAD, nontoxic, Head/Eye: No conjunctival hemorrhage, no icterus, [place]/AT, No nystagmus ENT:  No  icterus,  No thrush, no pharyngeal exudate Neck:  No masses, no lymphadenpathy,No meningismus CV:  RRR, no rub, no gallop, no S3; +JVD Lung:  Bilateral crackles. No wheeze  Abdomen: soft/NT, +BS, nondistended, no peritoneal signs Ext: No cyanosis, No rashes, No petechiae, No lymphangitis, 1+LE edema   Labs on Admission:  Basic Metabolic Panel:  Recent Labs Lab 08/28/14 1134  NA 146*  K 4.9  CL 114*  CO2 18*  GLUCOSE 195*  BUN 60*  CREATININE 1.96*  CALCIUM 9.1   Liver Function Tests:  Recent Labs Lab 08/28/14 1134  AST 21  ALT 18  ALKPHOS 159*  BILITOT 0.4  PROT 7.7  ALBUMIN 3.4*   No results for input(s): LIPASE, AMYLASE in the last 168 hours. No results for input(s): AMMONIA in the last 168 hours. CBC:  Recent Labs Lab 08/28/14 1134  WBC 10.9*  NEUTROABS 9.1*  HGB 11.4*  HCT 35.9*  MCV 89.5  PLT 376   Cardiac Enzymes: No results for input(s): CKTOTAL, CKMB, CKMBINDEX, TROPONINI in the last 168 hours. BNP: Invalid input(s): POCBNP CBG: No results for input(s): GLUCAP in the last 168 hours.  Radiological Exams on  Admission: Dg Chest Port 1 View  08/28/2014   CLINICAL DATA:  Shortness of Breath  EXAM: PORTABLE CHEST - 1 VIEW  COMPARISON:  08/21/2014  FINDINGS: Cardiac shadow is stable. Increased central vascularity with both alveolar and interstitial edema is noted consistent with CHF. Followup films following appropriate therapy recommended. No bony abnormality is seen.  IMPRESSION: Changes consistent with CHF with pulmonary edema.   Electronically Signed   By: Alcide CleverMark  Lukens M.D.   On: 08/28/2014 12:09    EKG: Independently reviewed. AFib with RVR with ST depression V4-6, II, II, AVF    Time spent:70 minutes Code Status:   DNR Family Communication:   Updated daughter Jasmine DecemberSharon [phone]) at bedside   Fantasia Jinkins, DO  [facility] Pager [phone]  If 7PM-7AM, please contact night-coverage [email or web address] Password [email or web address] 08/28/2014, 2:13 PM

## 2014-08-28 NOTE — ED Notes (Signed)
MD spoke with pt family. Pt DNR, DNR form got left at former nursing facility.

## 2014-08-28 NOTE — Progress Notes (Signed)
ANTIBIOTIC CONSULT NOTE - INITIAL  Pharmacy Consult for Vancomycin/cefepime Indication: HCAP  Allergies  Allergen Reactions  . Contrast Media [Iodinated Diagnostic Agents] Other (See Comments)    MAR  . Statins   . Sulfa Antibiotics Hives  . Vesicare [Solifenacin] Other (See Comments)    MAR    Patient Measurements: Weight: 170 lb (77.111 kg)  Vital Signs: Temp: 100.1 F (37.8 C) (03/21 1152) Temp Source: Rectal (03/21 1152) BP: 133/72 mmHg (03/21 1152) Pulse Rate: 114 (03/21 1155) Intake/Output from previous day:   Intake/Output from this shift: Total I/O In: -  Out: 6 [Urine:6]  Labs:  Recent Labs  08/28/14 1134  WBC 10.9*  HGB 11.4*  PLT 376   CrCl cannot be calculated (Unknown ideal weight.). No results for input(s): VANCOTROUGH, VANCOPEAK, VANCORANDOM, GENTTROUGH, GENTPEAK, GENTRANDOM, TOBRATROUGH, TOBRAPEAK, TOBRARND, AMIKACINPEAK, AMIKACINTROU, AMIKACIN in the last 72 hours.   Microbiology: Recent Results (from the past 720 hour(s))  Urine culture     Status: None   Collection Time: 08/21/14  2:01 AM  Result Value Ref Range Status   Specimen Description URINE, CATHETERIZED  Final   Special Requests NONE  Final   Colony Count   Final    80,000 COLONIES/ML Performed at [facility]    Culture   Final    Multiple bacterial morphotypes present, none predominant. Suggest appropriate recollection if clinically indicated. Performed at [facility]    Report Status 08/22/2014 FINAL  Final    Medical History: Past Medical History  Diagnosis Date  . Falls frequently   . Cardiac dysrhythmia   . Anemia   . Organic brain syndrome   . Difficulty walking   . Acute renal failure   . Azotemia   . Hyperkalemia   . UTI (lower urinary tract infection)   . Syncope   . Hypertension   . Acid reflux   . Alzheimer disease   . Diabetes mellitus without complication   . UTI (lower urinary tract infection)   . Dysphagia, oropharyngeal phase    . Dementia, unspecified, without behavioral disturbance   . Other specified cardiac dysrhythmias(427.89)     Assessment: 81 y.o. female  who presents with increased work of breathing and hypoxia from [facility]. Patient was seen on the ED on 08/21/2014 after having a choking episode. Patient was found this morning to be dyspneic at approximately 10:30, was also hypoxic, was initially 87% on room air.  Pharmacy consulted to dose cefepime and vancomycin for HCAP.  Scr 1.3 (3/14) CrCl ~ 2837mls/min  Goal of Therapy:  Vancomycin trough level 15-20 mcg/ml Cefepime per renal function and indication  Plan:  -Vancomycin 1gm IV q24h -Cefepime 2gm IV q24h -follow renal function/cultures/clinical course -vanc trough at steady state as needed    Arley Phenixllen Consuello Lassalle RPh 08/28/2014, 12:19 PM Pager [phone]

## 2014-08-28 NOTE — ED Notes (Signed)
Pt has sores in bilateral buttocks/rectal area.

## 2014-08-28 NOTE — Progress Notes (Signed)
CRITICAL VALUE ALERT  Critical value received:  Troponin 4.08  Date of notification:  08/28/2014  Time of notification:  1854  Critical value read back: yes  Nurse who received alert:  Redgie GrayerPamela Sidney Kann RN   MD notified (1st page):    Time of first page:    MD notified (2nd page): Dr Rennis GoldenHilty  Time of second page: [id number]  Responding MD: Tresa EndoKelly PA  Time MD responded:  1910

## 2014-08-28 NOTE — Consult Note (Signed)
CARDIOLOGY CONSULT NOTE   Patient ID: [name] MRN: [id number] DOB/AGE: 01/18/1925 81 y.o.  Admit Date: 08/28/2014  Primary Physician: Terald Sleeper, MD  Primary Cardiologist  [facility]   Clinical Summary [name] is a 81 y.o.female. The patient is here with CHF and rapid atrial fibrillation. We do not know which occurred first. There is troponin elevation. The patient has severe dementia. She was seen in the emergency room one week ago to assess for the possibility of aspiration. She was stable and allowed to go back to the nursing center. EKG at that time showed normal sinus rhythm. In the records there is repeated reference to chronic atrial fibrillation. It appears the fact that the patient has paroxysmal atrial fibrillation. Her EKG one week ago was normal sinus rhythm and other EKGs that I have reviewed from the past revealed normal sinus rhythm. There is a history of her being on propafenone. There is known coronary disease with history of a stent in the past. At one point it was recommended that her propafenone be stopped. Eventually she ended up back on it. Currently she is admitted on propafenone.  Over the past day she's had increasing shortness of breath. She presents now with significant volume overload by chest x-ray. She is short of breath and hypoxic. She has atrial fibrillation with a rapid rate. She is not anticoagulated.   Allergies  Allergen Reactions  . Contrast Media [Iodinated Diagnostic Agents] Other (See Comments)    MAR  . Statins   . Sulfa Antibiotics Hives  . Vesicare [Solifenacin] Other (See Comments)    MAR    Medications Scheduled Medications: . [START ON 08/29/2014] ceFEPime (MAXIPIME) IV  2 g Intravenous Q24H  . [START ON 08/29/2014] vancomycin  1,000 mg Intravenous Q24H     Infusions: . diltiazem (CARDIZEM) infusion 5 mg/hr (08/28/14 1429)     PRN Medications:     Past Medical History  Diagnosis Date  .  Falls frequently   . Cardiac dysrhythmia   . Anemia   . Organic brain syndrome   . Difficulty walking   . Acute renal failure   . Azotemia   . Hyperkalemia   . UTI (lower urinary tract infection)   . Syncope   . Hypertension   . Acid reflux   . Alzheimer disease   . Diabetes mellitus without complication   . UTI (lower urinary tract infection)   . Dysphagia, oropharyngeal phase   . Dementia, unspecified, without behavioral disturbance   . Other specified cardiac dysrhythmias(427.89)     Past Surgical History  Procedure Laterality Date  . Joint replacement      right hip  . Joint replacement      left hip 2012  . Cardiac surgery      stent  . Appendectomy    . Cholecystectomy    . Abdominal hysterectomy      Family History  Problem Relation Age of Onset  . Diabetes Mother   . Dementia Brother   . Dementia Brother   . Fibromyalgia Daughter     Social History [name] reports that she has never smoked. She has never used smokeless tobacco. [name] reports that she does not drink alcohol.  Review of Systems    The patient does not respond in a meaningful fashion to any questions. I am not able to take any further review of systems.   Physical Examination Blood pressure 129/70, pulse 146, temperature 100.1 F (  37.8 C), temperature source Rectal, resp. rate 35, weight 170 lb (77.111 kg), SpO2 92 %.  Intake/Output Summary (Last 24 hours) at 08/28/14 1527 Last data filed at 08/28/14 1438  Gross per 24 hour  Intake   1000 ml  Output      6 ml  Net    994 ml    Unfortunately the patient has significant dementia and does not respond to questioning. She is thin and frail. She is mildly short of breath at rest. Head is atraumatic. She is pale. Lungs reveal diffuse rhonchi and rales. Cardiac exam reveals an S1 and S2. The rhythm is irregularly irregular. The abdomen is soft. There is mild peripheral edema. There are no significant skin rashes. Neurologic cannot be  assessed fully.   Prior Cardiac Testing/Procedures  Lab Results  Basic Metabolic Panel:  Recent Labs Lab 08/28/14 1134  NA 146*  K 4.9  CL 114*  CO2 18*  GLUCOSE 195*  BUN 60*  CREATININE 1.96*  CALCIUM 9.1    Liver Function Tests:  Recent Labs Lab 08/28/14 1134  AST 21  ALT 18  ALKPHOS 159*  BILITOT 0.4  PROT 7.7  ALBUMIN 3.4*    CBC:  Recent Labs Lab 08/28/14 1134  WBC 10.9*  NEUTROABS 9.1*  HGB 11.4*  HCT 35.9*  MCV 89.5  PLT 376    Cardiac Enzymes: No results for input(s): CKTOTAL, CKMB, CKMBINDEX, TROPONINI in the last 168 hours.  BNP: Invalid input(s): POCBNP   Radiology: Dg Chest Port 1 View  08/28/2014   CLINICAL DATA:  Shortness of Breath  EXAM: PORTABLE CHEST - 1 VIEW  COMPARISON:  08/21/2014  FINDINGS: Cardiac shadow is stable. Increased central vascularity with both alveolar and interstitial edema is noted consistent with CHF. Followup films following appropriate therapy recommended. No bony abnormality is seen.  IMPRESSION: Changes consistent with CHF with pulmonary edema.   Electronically Signed   By: Alcide Clever M.D.   On: 08/28/2014 12:09     ECG: I have reviewed old and new EKGs. The patient was seen in the emergency room on August 21, 2014, she was in normal sinus rhythm. Currently she is in atrial fibrillation with a rapid rate.   Impression and Recommendations    Diabetes mellitus type 2 in nonobese       Advanced dementia     Her dementia is certainly a significant problem. She would not be a candidate for aggressive cardiac interventions.    Acute on chronic diastolic CHF (congestive heart failure)    Historically she has had normal systolic function. The last echo was 2012. With an ejection fraction of 60%. Repeat echo has been ordered. At this point we cannot tell if paroxysmal atrial fibrillation has caused her to go into heart failure. It is also possible that the atrial fib is secondary to the episode of CHF. The plan  will be to control her atrial fibrillation to diurese her. Two-dimensional echo will be done to reassess LV function.    Acute on chronic renal failure     Renal function will be followed as she is diuresed. Hopefully it will improve with diuresis.     Acute respiratory failure     Her respiratory failure is secondary to CHF. We cannot rule out a component of infection or aspiration. These are all being treated.     Elevated troponin     At this point the troponin elevation is probably due to her overall illness and her rapid  atrial fibrillation. I would not proceed with any type of invasive cardiac evaluation.     Paroxysmal atrial fibrillation     As I mentioned above, over recent EKGs she has had normal sinus rhythm. Sometime in the last week she's developed rapid atrial fibrillation. She has been on propafenone. I'm not sure we can say that this is a propafenone failure, as CHF may have set off her rapid atrial fib. However this drug is relatively contraindicated in patients with known coronary disease. I feel it is most prudent to stop propafenone at this time. Plan to use beta blockers and diltiazem for rate control. This has been started by using IV diltiazem. The patient also is not a good candidate for anticoagulation for her atrial fibrillation. She is on aspirin, but this is no longer considered formal treatment for atrial fibrillation risk. However she has coronary disease and aspirin should be continued on that basis. Thyroid functions will be checked.  Jerral BonitoJeff Shellia Hartl, MD 08/28/2014, 3:27 PM

## 2014-08-28 NOTE — ED Notes (Signed)
Per EMS. Pt from [facility] dementia unit. Hx of dementia, oriented per norm according to staff. Pt has hx of aspiration a week and a half ago. Staff state pt was fine this first thing this am. When they rounded on pt at 1030, pt was found to be dyspneic. Pt put on NRB, by nursing home staff. Lungs clear on arrival. O2 sat 87% on RA. Put pt on 4L [place], brought O2 sat to 94

## 2014-08-28 NOTE — ED Provider Notes (Signed)
CSN: [id number]     Arrival date & time 08/28/14  1120 History   First MD Initiated Contact with Patient 08/28/14 1141     Chief Complaint  Patient presents with  . Shortness of Breath     (Consider location/radiation/quality/duration/timing/severity/associated sxs/prior Treatment) Patient is a 81 y.o. female presenting with shortness of breath. The history is provided by the patient, the EMS personnel and the nursing home. The history is limited by the condition of the patient and the absence of a caregiver.  Shortness of Breath Severity:  Unable to specify Onset quality:  Unable to specify Timing:  Unable to specify Progression:  Unable to specify Relieved by:  Oxygen Worsened by:  Nothing tried Ineffective treatments:  None tried Associated symptoms: no abdominal pain, no chest pain, no cough, no diaphoresis, no fever, no headaches, no neck pain, no sore throat and no vomiting   Associated symptoms comment:  Denies pain   Past Medical History  Diagnosis Date  . Falls frequently   . Cardiac dysrhythmia   . Anemia   . Organic brain syndrome   . Difficulty walking   . Acute renal failure   . Azotemia   . Hyperkalemia   . UTI (lower urinary tract infection)   . Syncope   . Hypertension   . Acid reflux   . Alzheimer disease   . Diabetes mellitus without complication   . UTI (lower urinary tract infection)   . Dysphagia, oropharyngeal phase   . Dementia, unspecified, without behavioral disturbance   . Other specified cardiac dysrhythmias(427.89)    Past Surgical History  Procedure Laterality Date  . Joint replacement      right hip  . Joint replacement      left hip 2012  . Cardiac surgery      stent  . Appendectomy    . Cholecystectomy    . Abdominal hysterectomy     Family History  Problem Relation Age of Onset  . Diabetes Mother   . Dementia Brother   . Dementia Brother   . Fibromyalgia Daughter    History  Substance Use Topics  . Smoking status: Never  Smoker   . Smokeless tobacco: Never Used  . Alcohol Use: No   OB History    No data available     Review of Systems  Constitutional: Negative for fever, chills, diaphoresis, activity change, appetite change and fatigue.  HENT: Negative for congestion, facial swelling, rhinorrhea and sore throat.   Eyes: Negative for photophobia and discharge.  Respiratory: Positive for shortness of breath. Negative for cough and chest tightness.   Cardiovascular: Negative for chest pain, palpitations and leg swelling.  Gastrointestinal: Negative for nausea, vomiting, abdominal pain and diarrhea.  Endocrine: Negative for polydipsia and polyuria.  Genitourinary: Negative for dysuria, frequency, difficulty urinating and pelvic pain.  Musculoskeletal: Negative for back pain, arthralgias, neck pain and neck stiffness.  Skin: Negative for color change and wound.  Allergic/Immunologic: Negative for immunocompromised state.  Neurological: Negative for facial asymmetry, weakness, numbness and headaches.  Hematological: Does not bruise/bleed easily.  Psychiatric/Behavioral: Negative for confusion and agitation.      Allergies  Contrast media; Statins; Sulfa antibiotics; and Vesicare  Home Medications   Prior to Admission medications   Medication Sig Start Date End Date Taking? Authorizing Provider  acetaminophen (TYLENOL) 325 MG tablet Take 650 mg by mouth as needed for mild pain or moderate pain.   Yes Historical Provider, MD  amLODipine (NORVASC) 10 MG tablet Take 10  mg by mouth daily.   Yes Historical Provider, MD  aspirin 81 MG chewable tablet Chew 81 mg by mouth daily.   Yes Historical Provider, MD  Calcium 500 MG tablet Take 500 mg by mouth 2 (two) times daily.   Yes Historical Provider, MD  cetirizine (ZYRTEC) 10 MG chewable tablet Chew 10 mg by mouth at bedtime.    Yes Historical Provider, MD  Cholecalciferol (VITAMIN D-3) 1000 UNITS CAPS Take 2,000 Units by mouth daily. For vitamin D replacement    Yes Historical Provider, MD  diphenhydrAMINE (BANOPHEN) 25 MG tablet Take 12.5 mg by mouth every 8 (eight) hours as needed (Pruritis).   Yes Historical Provider, MD  docusate sodium (COLACE) 100 MG capsule Take 100 mg by mouth 2 (two) times daily. For constipation   Yes Historical Provider, MD  glipiZIDE (GLUCOTROL XL) 2.5 MG 24 hr tablet Take 2.5 mg by mouth daily. For DM   Yes Historical Provider, MD  HYDROcodone-acetaminophen (NORCO/VICODIN) 5-325 MG per tablet Take one tablet by mouth every 4 hours as needed for pain Patient taking differently: Take 1 tablet by mouth every 4 (four) hours as needed for moderate pain. Take one tablet by mouth every 4 hours as needed for pain 05/22/14  Yes Tiffany L Reed, DO  ipratropium-albuterol (DUONEB) 0.5-2.5 (3) MG/3ML SOLN Take 3 mLs by nebulization every 6 (six) hours as needed. SOB/Wheezing    Yes Historical Provider, MD  LORazepam (ATIVAN) 0.5 MG tablet Take 1/2 tablet by mouth every 8 hours as needed for anxiety Patient taking differently: Take 0.25 mg by mouth every 8 (eight) hours as needed for anxiety. Take 1/2 tablet by mouth every 8 hours as needed for anxiety 04/27/14  Yes Sharon SellerJessica K Eubanks, NP  metoprolol (LOPRESSOR) 50 MG tablet Take 50 mg by mouth 2 (two) times daily. *HOLD for SBP 100 or PULSE 50* For HTN   Yes Historical Provider, MD  potassium chloride (MICRO-K) 10 MEQ CR capsule Take 10 mEq by mouth 2 (two) times daily.   Yes Historical Provider, MD  propafenone (RYTHMOL) 225 MG tablet Take 225 mg by mouth 2 (two) times daily.     Yes Historical Provider, MD  ranitidine (ZANTAC) 75 MG tablet Take 150 mg by mouth 2 (two) times daily.    Yes Historical Provider, MD   BP 127/54 mmHg  Pulse 93  Temp(Src) 98.1 F (36.7 C) (Oral)  Resp 24  Ht 5\' 7"  (1.702 m)  Wt 131 lb 6.3 oz (59.6 kg)  BMI 20.57 kg/m2  SpO2 96% Physical Exam  Constitutional: She is oriented to person, place, and time. She appears well-developed and well-nourished. She  appears distressed.  HENT:  Head: Normocephalic and atraumatic.  Mouth/Throat: No oropharyngeal exudate.  Eyes: Pupils are equal, round, and reactive to light.  Neck: Normal range of motion. Neck supple.  Cardiovascular: Normal heart sounds.  An irregularly irregular rhythm present. Tachycardia present.  Exam reveals no gallop and no friction rub.   No murmur heard. Pulmonary/Chest: Breath sounds normal. Accessory muscle usage present. Tachypnea noted. She is in respiratory distress. She has no wheezes. She has no rales.  Abdominal: Soft. Bowel sounds are normal. She exhibits no distension and no mass. There is no tenderness. There is no rebound and no guarding.  Musculoskeletal: Normal range of motion. She exhibits no edema or tenderness.  Neurological: She is alert and oriented to person, place, and time.  Skin: Skin is warm and dry.  Psychiatric: She has a normal mood and  affect.    ED Course  Procedures (including critical care time) Labs Review Labs Reviewed  CBC WITH DIFFERENTIAL/PLATELET - Abnormal; Notable for the following:    WBC 10.9 (*)    Hemoglobin 11.4 (*)    HCT 35.9 (*)    Neutrophils Relative % 83 (*)    Neutro Abs 9.1 (*)    Lymphocytes Relative 10 (*)    All other components within normal limits  COMPREHENSIVE METABOLIC PANEL - Abnormal; Notable for the following:    Sodium 146 (*)    Chloride 114 (*)    CO2 18 (*)    Glucose, Bld 195 (*)    BUN 60 (*)    Creatinine, Ser 1.96 (*)    Albumin 3.4 (*)    Alkaline Phosphatase 159 (*)    GFR calc non Af Amer 22 (*)    GFR calc Af Amer 25 (*)    All other components within normal limits  URINALYSIS, ROUTINE W REFLEX MICROSCOPIC - Abnormal; Notable for the following:    Color, Urine AMBER (*)    APPearance TURBID (*)    Hgb urine dipstick LARGE (*)    Bilirubin Urine SMALL (*)    Protein, ur >300 (*)    Leukocytes, UA LARGE (*)    All other components within normal limits  BRAIN NATRIURETIC PEPTIDE -  Abnormal; Notable for the following:    B Natriuretic Peptide 1992.3 (*)    All other components within normal limits  COMPREHENSIVE METABOLIC PANEL - Abnormal; Notable for the following:    Chloride 114 (*)    Glucose, Bld 217 (*)    BUN 58 (*)    Creatinine, Ser 1.96 (*)    Calcium 8.3 (*)    Albumin 2.9 (*)    Alkaline Phosphatase 145 (*)    GFR calc non Af Amer 22 (*)    GFR calc Af Amer 25 (*)    All other components within normal limits  BLOOD GAS, ARTERIAL - Abnormal; Notable for the following:    pCO2 arterial 30.1 (*)    pO2, Arterial 68.1 (*)    Bicarbonate 17.6 (*)    Acid-base deficit 6.0 (*)    All other components within normal limits  URINE MICROSCOPIC-ADD ON - Abnormal; Notable for the following:    Bacteria, UA MANY (*)    All other components within normal limits  TROPONIN I - Abnormal; Notable for the following:    Troponin I 4.08 (*)    All other components within normal limits  TROPONIN I - Abnormal; Notable for the following:    Troponin I 5.52 (*)    All other components within normal limits  TROPONIN I - Abnormal; Notable for the following:    Troponin I 7.10 (*)    All other components within normal limits  BASIC METABOLIC PANEL - Abnormal; Notable for the following:    Sodium 146 (*)    Chloride 113 (*)    Glucose, Bld 159 (*)    BUN 64 (*)    Creatinine, Ser 2.10 (*)    GFR calc non Af Amer 20 (*)    GFR calc Af Amer 23 (*)    All other components within normal limits  CBC - Abnormal; Notable for the following:    WBC 11.7 (*)    RBC 3.45 (*)    Hemoglobin 9.8 (*)    HCT 30.7 (*)    All other components within normal limits  GLUCOSE, CAPILLARY -  Abnormal; Notable for the following:    Glucose-Capillary 172 (*)    All other components within normal limits  GLUCOSE, CAPILLARY - Abnormal; Notable for the following:    Glucose-Capillary 171 (*)    All other components within normal limits  GLUCOSE, CAPILLARY - Abnormal; Notable for the  following:    Glucose-Capillary 162 (*)    All other components within normal limits  I-STAT TROPOININ, ED - Abnormal; Notable for the following:    Troponin i, poc 1.90 (*)    All other components within normal limits  MRSA PCR SCREENING  URINE CULTURE  CULTURE, BLOOD (ROUTINE X 2)  CULTURE, BLOOD (ROUTINE X 2)  PROCALCITONIN  HEMOGLOBIN A1C  I-STAT CG4 LACTIC ACID, ED  I-STAT CG4 LACTIC ACID, ED  I-STAT CG4 LACTIC ACID, ED    Imaging Review Dg Chest Port 1 View  08/28/2014   CLINICAL DATA:  Shortness of Breath  EXAM: PORTABLE CHEST - 1 VIEW  COMPARISON:  08/21/2014  FINDINGS: Cardiac shadow is stable. Increased central vascularity with both alveolar and interstitial edema is noted consistent with CHF. Followup films following appropriate therapy recommended. No bony abnormality is seen.  IMPRESSION: Changes consistent with CHF with pulmonary edema.   Electronically Signed   By: Alcide Clever M.D.   On: 08/28/2014 12:09     EKG Interpretation   Date/Time:  Monday August 28 2014 11:27:27 EDT Ventricular Rate:  140 PR Interval:    QRS Duration: 92 QT Interval:  281 QTC Calculation: 429 R Axis:   74 Text Interpretation:  Age not entered, assumed to be  81 years old for  purpose of ECG interpretation Atrial fibrillation Repol abnrm suggests  ischemia, diffuse leads Prior with sinus rhythm Confirmed by DOCHERTY  MD,  MEGAN [id number]) on 08/28/2014 12:11:51 PM       CRITICAL CARE Performed by: Toy Cookey, E Total critical care time: 40 Critical care time was exclusive of separately billable procedures and treating other patients. Critical care was necessary to treat or prevent imminent or life-threatening deterioration. Critical care was time spent personally by me on the following activities: development of treatment plan with patient and/or surrogate as well as nursing, discussions with consultants, evaluation of patient's response to treatment, examination of patient,  obtaining history from patient or surrogate, ordering and performing treatments and interventions, ordering and review of laboratory studies, ordering and review of radiographic studies, pulse oximetry and re-evaluation of patient's condition.   MDM   Final diagnoses:  SOB (shortness of breath)    Pt is a 81 y.o. female with Pmhx as above who presents with increased work of breathing and hypoxia from [facility] dementia unit.  Patient was seen on the ED on 08/21/2014 after having a choking episode.  Patient was found this morning to be dyspneic at approximately 10:30, was also hypoxic, was initially 87% on room air.  On physical exam, patient is tachycardic in the 140s in atrial fibrillation, was also kicked In the mid 30s.  She states that she does not feel well, but is otherwise unable to describe her symptoms.  Lungs sound clear.  She does not appear fluid overloaded.  Per nursing.  She was covered in hardened stool.  Given her hypoxia, tachycardia, and a rectal temp of 100.1, while residing at a nursing facility level to cut sepsis initiated and patient will be given broad-spectrum antibiotics for suspected aspiration pneumonia.  I spoke with with pt's daughter, Venetia Night, daughter about pt's code status.  Pt is a DNR, does not want CPR, intubation, cardiac cath, but is fine with IVF, abx, hospital admission. [phone]. Pt was going to be assessed to be transferred to [facility] in [place].  Workup was an unclear picture CHF versus sepsis with chest x-ray showing cardiomegaly and pulmonary edema and BNP elevated, however.  Patient also has a leukocytosis, as well as acute on chronic renal failure, elevated troponin.  Patient is denying chest pain, though is not a reliable historian.  EKG shows ST depression consistent with ischemia, which is likely rate dependent or due to acute illness.  PR, aspirin has been given, again, daughter would not want cardiac cath.  Tried  consulted and will admit.  Heart rate is improved with IV fluids, 10 mg of IV diltiazem has also been given.      Toy Cookey, MD 08/29/14 [phone]

## 2014-08-28 NOTE — ED Notes (Signed)
Bed: [id number] Expected date:  Expected time:  Means of arrival:  Comments: Ems- aspiration

## 2014-08-28 NOTE — ED Notes (Signed)
Pt was soiled upon arrival. Cleaned pt when performing I/O and rectal temp. Pt placed in brief and chucks underneath.

## 2014-08-28 NOTE — Progress Notes (Signed)
CRITICAL VALUE ALERT  Critical value received:  Troponin 4.08  Date of notification:  08/28/2014    Time of notification:  1630  Critical value read back: yes  Nurse who received alert:  Redgie GrayerPamela Sabian Kuba RN  MD notified (1st page):  Tat  Time of first page: 1849  MD notified (2nd page):  Time of second page:  Responding MD:  Tat  Time MD responded: [id number]

## 2014-08-29 DIAGNOSIS — I509 Heart failure, unspecified: Secondary | ICD-10-CM

## 2014-08-29 DIAGNOSIS — I480 Paroxysmal atrial fibrillation: Secondary | ICD-10-CM

## 2014-08-29 DIAGNOSIS — E43 Unspecified severe protein-calorie malnutrition: Secondary | ICD-10-CM | POA: Insufficient documentation

## 2014-08-29 LAB — BASIC METABOLIC PANEL
ANION GAP: 14 (ref 5–15)
BUN: 64 mg/dL — ABNORMAL HIGH (ref 6–23)
CHLORIDE: 113 mmol/L — AB (ref 96–112)
CO2: 19 mmol/L (ref 19–32)
Calcium: 8.5 mg/dL (ref 8.4–10.5)
Creatinine, Ser: 2.1 mg/dL — ABNORMAL HIGH (ref 0.50–1.10)
GFR calc non Af Amer: 20 mL/min — ABNORMAL LOW (ref >90)
GFR, EST AFRICAN AMERICAN: 23 mL/min — AB (ref >90)
Glucose, Bld: 159 mg/dL — ABNORMAL HIGH (ref 70–99)
Potassium: 3.8 mmol/L (ref 3.5–5.1)
Sodium: 146 mmol/L — ABNORMAL HIGH (ref 135–145)

## 2014-08-29 LAB — GLUCOSE, CAPILLARY
GLUCOSE-CAPILLARY: 143 mg/dL — AB (ref 70–99)
GLUCOSE-CAPILLARY: 125 mg/dL — AB (ref 70–99)
Glucose-Capillary: 162 mg/dL — ABNORMAL HIGH (ref 70–99)
Glucose-Capillary: 131 mg/dL — ABNORMAL HIGH (ref 70–99)

## 2014-08-29 LAB — CBC
HCT: 30.7 % — ABNORMAL LOW (ref 36.0–46.0)
HEMOGLOBIN: 9.8 g/dL — AB (ref 12.0–15.0)
MCH: 28.4 pg (ref 26.0–34.0)
MCHC: 31.9 g/dL (ref 30.0–36.0)
MCV: 89 fL (ref 78.0–100.0)
Platelets: 330 10*3/uL (ref 150–400)
RBC: 3.45 MIL/uL — ABNORMAL LOW (ref 3.87–5.11)
RDW: 14.6 % (ref 11.5–15.5)
WBC: 11.7 10*3/uL — AB (ref 4.0–10.5)

## 2014-08-29 LAB — TROPONIN I: Troponin I: 7.1 ng/mL (ref <0.031)

## 2014-08-29 MED ORDER — VANCOMYCIN HCL IN DEXTROSE 1-5 GM/200ML-% IV SOLN: 1000.0000 mg | INTRAVENOUS | Status: DC

## 2014-08-29 MED ORDER — PIPERACILLIN-TAZOBACTAM IN DEX 2-0.25 GM/50ML IV SOLN
2.2500 g | Freq: Four times a day (QID) | INTRAVENOUS | Status: DC
  Administered 2014-08-29 – 2014-08-31 (×8): 2.25 g via INTRAVENOUS
  Filled 2014-08-29 (×10): qty 50

## 2014-08-29 MED ORDER — METOPROLOL TARTRATE 1 MG/ML IV SOLN
5.0000 mg | Freq: Four times a day (QID) | INTRAVENOUS | Status: DC
  Administered 2014-08-29 – 2014-08-30 (×3): 5 mg via INTRAVENOUS
  Filled 2014-08-29 (×3): qty 5

## 2014-08-29 MED ORDER — CLOPIDOGREL BISULFATE 75 MG PO TABS: 75.0000 mg | ORAL_TABLET | Freq: Every day | ORAL | Status: DC

## 2014-08-29 MED ORDER — METOPROLOL TARTRATE 25 MG PO TABS: 50.0000 mg | ORAL_TABLET | Freq: Two times a day (BID) | ORAL | Status: DC

## 2014-08-29 NOTE — Progress Notes (Signed)
INITIAL NUTRITION ASSESSMENT  DOCUMENTATION CODES Per approved criteria  -Severe malnutrition in the context of chronic illness   Severe malnutrition in the context of chronic illness as evidenced by severe depletion of fat and muscle mass.   INTERVENTION: -If pt is expected to remain NPO, consider initiation of nutrition support -If diet advances recommend Ensure Enlive po BID each providing 350 kcal and 20 g protein  -Continue to monitor  NUTRITION DIAGNOSIS: Inadequate oral intake related to decreased appetite as evidenced by severe dementia.   Goal: Pt to meet >/= 90% of estimated needs  Monitor:  PO intake, weight trends, labs  Reason for Assessment: Low Braden  81 y.o. female  Admitting Dx: <principal problem not specified>  ASSESSMENT: Pt with severe dementia from SNF presenting with CHF and A.fib.  PMH significant for CKD stage III, DM and HTN.  Pt at baseline can speak 1-2 words, right now only nods head and says yes.  Spoke with RN, says daughter reports she is on a Diabetic diet at her SNF but only eats what she wants and when she wants at [place].  Daughter told the RN the pt had an Ensure yesterday brought from home.  Pt has been refusing anything by mouth since admission, even applesauce with her meds.   SLP re-evaluated pt and recommends NPO diet order.  Consider nutrition support if expected to remain NPO.  Nutrition Focused Physical Exam:  Subcutaneous Fat:  Orbital Region: severe depletion Upper Arm Region: n/a Thoracic and Lumbar Region: n/a  Muscle:  Temple Region: severe depletion Clavicle Bone Region: severe depletion Clavicle and Acromion Bone Region: severe depeltion Scapular Bone Region: n/a Dorsal Hand: n/a Patellar Region: moderate depletion Anterior Thigh Region: severe depletion Posterior Calf Region: severe depletion  Edema: none present   Height: Ht Readings from Last 1 Encounters:  08/28/14  (1.702 m)    Weight: Wt  Readings from Last 1 Encounters:  08/29/14 131 lb 6.3 oz (59.6 kg)    Ideal Body Weight: 135 lbs (61 kg)  % Ideal Body Weight: 97%  Wt Readings from Last 10 Encounters:  08/29/14 131 lb 6.3 oz (59.6 kg)  03/30/12 173 lb 1 oz (78.5 kg)  03/18/11 205 lb (92.987 kg)  01/24/11 205 lb 11 oz (93.3 kg)    Usual Body Weight: n/a  % Usual Body Weight: n/a  BMI:  Body mass index is 20.57 kg/(m^2).  Estimated Nutritional Needs: Kcal: 1800-2000 kcal Protein: 95-110 g protein Fluid: per MD  Skin: Stage I PU on back and heel   Diet Order: DIET DYS 2  EDUCATION NEEDS: -No education needs identified at this time   Intake/Output Summary (Last 24 hours) at 08/29/14 0944 Last data filed at 08/29/14 0831  Gross per 24 hour  Intake 1748.92 ml  Output      6 ml  Net 1742.92 ml    Last BM: 3/22   Labs:   Recent Labs Lab 08/28/14 1134 08/28/14 1504 08/29/14 0205  NA 146* 142 146*  K 4.9 4.5 3.8  CL 114* 114* 113*  CO2 18* 19 19  BUN 60* 58* 64*  CREATININE 1.96* 1.96* 2.10*  CALCIUM 9.1 8.3* 8.5  GLUCOSE 195* 217* 159*    CBG (last 3)   Recent Labs  08/28/14 1653 08/28/14 2141 08/29/14 0811  GLUCAP 172* 171* 162*    Scheduled Meds: . aspirin  300 mg Rectal Daily  . clopidogrel  75 mg Oral Daily  . enoxaparin (LOVENOX) injection  30 mg Subcutaneous Q24H  . famotidine (PEPCID) IV  20 mg Intravenous Q24H  . furosemide  40 mg Intravenous BID  . insulin aspart  0-9 Units Subcutaneous TID WC  . metoprolol  50 mg Oral BID  . piperacillin-tazobactam (ZOSYN)  IV  2.25 g Intravenous Q6H  . sodium chloride  3 mL Intravenous Q12H  . [START ON 08/30/2014] vancomycin  1,000 mg Intravenous Q48H    Continuous Infusions: . sodium chloride 10 mL/hr at 08/28/14 1626  . diltiazem (CARDIZEM) infusion 5 mg/hr (08/29/14 [id number])    Past Medical History  Diagnosis Date  . Falls frequently   . Cardiac dysrhythmia   . Anemia   . Organic brain syndrome   . Difficulty walking    . Acute renal failure   . Azotemia   . Hyperkalemia   . UTI (lower urinary tract infection)   . Syncope   . Hypertension   . Acid reflux   . Alzheimer disease   . Diabetes mellitus without complication   . UTI (lower urinary tract infection)   . Dysphagia, oropharyngeal phase   . Dementia, unspecified, without behavioral disturbance   . Other specified cardiac dysrhythmias(427.89)     Past Surgical History  Procedure Laterality Date  . Joint replacement      right hip  . Joint replacement      left hip 2012  . Cardiac surgery      stent  . Appendectomy    . Cholecystectomy    . Abdominal hysterectomy      Magdalen SpatzLauren Hoke Baer MS Dietetic Intern Pager Number [phone]

## 2014-08-29 NOTE — Care Management Note (Signed)
CARE MANAGEMENT NOTE 08/29/2014  Patient:  Juanito DoomCRE,Laylah A   Account Number:  [id number]  Date Initiated:  08/29/2014  Documentation initiated by:  Palestine Mosco  Subjective/Objective Assessment:   a,fib with volume overload and increased wob     Action/Plan:   home when stable   Anticipated DC Date:  09/01/2014   Anticipated DC Plan:  HOME/SELF CARE  In-house referral  NA      DC Planning Services  CM consult      PAC Choice  NA   Choice offered to / List presented to:  NA           Status of service:  In process, will continue to follow Medicare Important Message given?   (If response is "NO", the following Medicare IM given date fields will be blank) Date Medicare IM given:   Medicare IM given by:   Date Additional Medicare IM given:   Additional Medicare IM given by:    Discharge Disposition:    Per UR Regulation:  Reviewed for med. necessity/level of care/duration of stay  If discussed at Long Length of Stay Meetings, dates discussed:    Comments:  August 29, 2014/Sloane Palmer L. Earlene Plateravis, RN, BSN, CCM. Case Management [employer] [phone] No discharge needs present of time of review.

## 2014-08-29 NOTE — Progress Notes (Addendum)
ANTIBIOTIC CONSULT NOTE  Pharmacy Consult for Vancomycin/cefepime Indication: HCAP  Allergies  Allergen Reactions  . Contrast Media [Iodinated Diagnostic Agents] Other (See Comments)    MAR  . Statins   . Sulfa Antibiotics Hives  . Vesicare [Solifenacin] Other (See Comments)    MAR    Patient Measurements: Height:  (170.2 cm) Weight: 131 lb 6.3 oz (59.6 kg) IBW/kg (Calculated) : 61.6  Vital Signs: Temp: 98.1 F (36.7 C) (03/22 0800) Temp Source: Oral (03/22 0800) BP: 127/54 mmHg (03/22 0800) Pulse Rate: 93 (03/22 0800) Intake/Output from previous day: 03/21 0701 - 03/22 0700 In: 1688.9 [P.O.:240; I.V.:1286.4; IV Piggyback:162.5] Out: 6 [Urine:6] Intake/Output from this shift: Total I/O In: 63 [I.V.:13; IV Piggyback:50] Out: -   Labs:  Recent Labs  08/28/14 1134 08/28/14 1504 08/29/14 0205  WBC 10.9*  --  11.7*  HGB 11.4*  --  9.8*  PLT 376  --  330  CREATININE 1.96* 1.96* 2.10*   Estimated Creatinine Clearance: 17.8 mL/min (by C-G formula based on Cr of 2.1). No results for input(s): VANCOTROUGH, VANCOPEAK, VANCORANDOM, GENTTROUGH, GENTPEAK, GENTRANDOM, TOBRATROUGH, TOBRAPEAK, TOBRARND, AMIKACINPEAK, AMIKACINTROU, AMIKACIN in the last 72 hours.   Microbiology: Recent Results (from the past 720 hour(s))  Urine culture     Status: None   Collection Time: 08/21/14  2:01 AM  Result Value Ref Range Status   Specimen Description URINE, CATHETERIZED  Final   Special Requests NONE  Final   Colony Count   Final    80,000 COLONIES/ML Performed at [facility]    Culture   Final    Multiple bacterial morphotypes present, none predominant. Suggest appropriate recollection if clinically indicated. Performed at [facility]    Report Status 08/22/2014 FINAL  Final  Blood Culture (routine x 2)     Status: None (Preliminary result)   Collection Time: 08/28/14 12:04 PM  Result Value Ref Range Status   Specimen Description BLOOD LEFT ANTECUBITAL   Final   Special Requests BOTTLES DRAWN AEROBIC AND ANAEROBIC  Final   Culture   Final           BLOOD CULTURE RECEIVED NO GROWTH TO DATE CULTURE WILL BE HELD FOR 5 DAYS BEFORE ISSUING A FINAL NEGATIVE REPORT Performed at [facility]    Report Status PENDING  Incomplete  Blood Culture (routine x 2)     Status: None (Preliminary result)   Collection Time: 08/28/14 12:05 PM  Result Value Ref Range Status   Specimen Description BLOOD RIGHT ANTECUBITAL  Final   Special Requests BOTTLES DRAWN AEROBIC AND ANAEROBIC  Final   Culture   Final           BLOOD CULTURE RECEIVED NO GROWTH TO DATE CULTURE WILL BE HELD FOR 5 DAYS BEFORE ISSUING A FINAL NEGATIVE REPORT Performed at [facility]    Report Status PENDING  Incomplete  MRSA PCR Screening     Status: None   Collection Time: 08/28/14  4:34 PM  Result Value Ref Range Status   MRSA by PCR NEGATIVE NEGATIVE Final    Comment:        The GeneXpert MRSA Assay (FDA approved for NASAL specimens only), is one component of a comprehensive MRSA colonization surveillance program. It is not intended to diagnose MRSA infection nor to guide or monitor treatment for MRSA infections.     Medical History: Past Medical History  Diagnosis Date  . Falls frequently   . Cardiac dysrhythmia   .  Anemia   . Organic brain syndrome   . Difficulty walking   . Acute renal failure   . Azotemia   . Hyperkalemia   . UTI (lower urinary tract infection)   . Syncope   . Hypertension   . Acid reflux   . Alzheimer disease   . Diabetes mellitus without complication   . UTI (lower urinary tract infection)   . Dysphagia, oropharyngeal phase   . Dementia, unspecified, without behavioral disturbance   . Other specified cardiac dysrhythmias(427.89)     Assessment 81 y.o. female who presents with increased work of breathing and hypoxia from [facility]. Patient was seen on the ED on 08/21/2014 after having a  choking episode. Patient was found this morning to be dyspneic/hypoxic, initially 87% on RA. Pharmacy consulted to dose cefepime and vancomycin to r/o HCAP.  Cefepime changed to Zosyn after 1 dose for aspiration coverage.  CXR shows pulm edema c/w CHF.  3/21 >> vanc >> 3/21 >> cefepime >> 3/21 3/21 >> Zosyn >>  Tmax:100.1, now AF WBCs: mildly elevated Renal: Scr 2.1 (baseline 1.2); CrCl 18 ml/min PCT 0.32 (3/21)  3/21 blood x2: NGTD 3/21 urine: collected   Goal of Therapy:   Vancomycin trough level 15-20 mcg/ml   Eradication of infection  Appropriate antibiotic dosing for indication and renal function  Plan:   Adjust Zosyn to 2.25 g IV q6h while CrCl < 20 ml/min  Decrease vancomycin to 1g IV q48 with reduced renal function.  Opting for 1g over 500 mg as patient near high end of dosing nomogram parameters (CrCl 10-20 ml, wt 50-60kg), and I expect renal function to improve over the next 1-2 days.  If no improvement or worsened by tomorrow, would adjust to 500 mg IV q48 - dose due at 12p  Measure vancomycin trough levels at steady state as indicated  Follow clinical course, culture results as available, renal function  Follow for de-escalation of antibiotics and LOT   Bernadene Personrew Meyer Dockery, PharmD Pager: [phone] 08/29/2014, 10:39 AM

## 2014-08-29 NOTE — Progress Notes (Signed)
PROGRESS NOTE  [name] Graveline ZOX:[id number] DOB: 04/13/1925 DOA: 08/28/2014 PCP: Terald SleeperOBSON,MICHAEL GAVIN, MD  HPI:  81 year old female with history of dementia, atrial fibrillation, CKD stage III, diabetes mellitus, and hypertension presented from [facility] with increased work of breathing. At baseline, the patient is only able to speak 1 or 2 words, and she is wheelchair bound. Approximately one week prior to this admission, the patient came to the emergency department for choking episode. She was discharged from the emergency department stable condition. The patient was noted to have increased work of breathing on the morning of this admission. There's been no reports of vomiting, diarrhea, fevers, uncontrolled pain. In the emergency department, workup revealed serum creatinine 1.96, WBC 10.9. EKG showed atrial fibrillation with RVR. BNP was 1992. Chest x-ray showed pulmonary edema. ABG showed 7.38/30/68/17 on 6 L. The patient was initially thought to be septic and started on vancomycin and cefepime in the emergency department after blood cultures were obtained. She was also noted to have an point-of-care elevated troponin of 1.90. Lactic acid was 1.5. Assessment/Plan: Acute on chronic diastolic CHF -Patient is still clinically fluid overloaded -Continue intravenous furosemide 40 mg IV twice [name] day -Daily weights--neg 5 lbs -I/Os not accurate -Echocardiogram--EF 55-60%, No WMA, mod MR, mild AS Pulmonary infiltrates/Acute Respiratory Failure/Aspiration Pneumonitis -Now stable on 3 L -Certainly with the patient's clinical history of choking on her food she may have [name] component of aspiration contributing to the patient's chest x-ray findings  -Start Zosyn for aspiration pneumonitis--failed swallow eval although pt has been choking on food at SNF -Swallow evaluation  -Procalcitonin 0.32 -plan to d/c vanco 3/23 if blood cultures remain neg UTI -UA with TNTC WBC -urine  culture= GNR -continue zosyn pending culture data acute on chronic renal failure CKD stage III -Baseline creatinine 1.0-1.4  -Monitor renal function with diuresis  Atrial fibrillation with RVR  -Continue diltiazem drip after diltiazem bolus  -d/c propafenone per cardiology -Continue metoprolol tartrate-->change to IV -Continue aspirin supp for now -TSH Elevated troponin/NSTEMI -repeat serum troponins elevated -discussed with pt's daughter (POA)--the patient would not want heart catheterization, she would want the patient to be treated medically and requested cardiology evaluation  -appreciate cardiology-->medical management Dysphagia -may need NG if swallowing does not improve, particularly for meds -failed swallow eval -npo diabetes mellitus type 2  -Hemoglobin A1c--pending -Sensitive sliding scale  -Allow for liberal glycemic control  Dementia -at baseline per daughter Goals of Care -DNR -discussed with daughter--if no improvement with optimal care will consider comfort care   Communication: daughter on phone 3/22 Dispo--remain in stepdown     Procedures/Studies: Dg Chest 2 View (if Patient Has Fever And/or Copd)  08/21/2014   CLINICAL DATA:  Coughing and choking episode while drinking water  EXAM: CHEST  2 VIEW  COMPARISON:  05/04/2014  FINDINGS: There is mild enlargement of the pulmonary vasculature and mild interstitial thickening, [name] significant change from 05/04/2014. This likely represents [name] mild degree of congestive heart failure. There is no alveolar edema. There are no effusions. Heart size is normal and unchanged. Hilar and mediastinal contours are unremarkable and unchanged.  IMPRESSION: Mild CHF with vascular congestion and interstitial fluid. No alveolar edema.   Electronically Signed   By: Ellery Plunkaniel R Mitchell M.D.   On: 08/21/2014 01:19   Dg Chest Port 1 View  08/28/2014   CLINICAL DATA:  Shortness of Breath  EXAM: PORTABLE CHEST - 1 VIEW  COMPARISON:  08/21/2014  FINDINGS: Cardiac shadow is stable. Increased central vascularity with both alveolar and interstitial edema is noted consistent with CHF. Followup films following appropriate therapy recommended. No bony abnormality is seen.  IMPRESSION: Changes consistent with CHF with pulmonary edema.   Electronically Signed   By: Alcide Clever M.D.   On: 08/28/2014 12:09         Subjective: Patient is awake and alert. No vomiting, respiratory distress, diarrhea, uncontrolled pain.  Objective: Filed Vitals:   08/29/14 1142 08/29/14 1300 08/29/14 1500 08/29/14 1532  BP:  127/49 125/41   Pulse:  56 111   Temp: 98 F (36.7 C)   98.3 F (36.8 C)  TempSrc: Axillary   Oral  Resp:  24 25   Height:      Weight:      SpO2:  95% 96%     Intake/Output Summary (Last 24 hours) at 08/29/14 1827 Last data filed at 08/29/14 1434  Gross per 24 hour  Intake    688 ml  Output      0 ml  Net    688 ml   Weight change:  Exam:   General:  Pt is alert, follows commands appropriately, not in acute distress  HEENT: No icterus, No thrush, [place]/AT  Cardiovascular: IRRR, S1/S2, no rubs,   Respiratory: Bibasilar crackles. No wheeze.  Abdomen: Soft/+BS, non tender, non distended, no guarding  Extremities: trace edema, No lymphangitis, No petechiae, No rashes, no synovitis  Data Reviewed: Basic Metabolic Panel:  Recent Labs Lab 08/28/14 1134 08/28/14 1504 08/29/14 0205  NA 146* 142 146*  K 4.9 4.5 3.8  CL 114* 114* 113*  CO2 18* 19 19  GLUCOSE 195* 217* 159*  BUN 60* 58* 64*  CREATININE 1.96* 1.96* 2.10*  CALCIUM 9.1 8.3* 8.5   Liver Function Tests:  Recent Labs Lab 08/28/14 1134 08/28/14 1504  AST 21 22  ALT 18 17  ALKPHOS 159* 145*  BILITOT 0.4 0.6  PROT 7.7 6.9  ALBUMIN 3.4* 2.9*   No results for input(s): LIPASE, AMYLASE in the last 168 hours. No results for input(s): AMMONIA in the last 168 hours. CBC:  Recent Labs Lab 08/28/14 1134 08/29/14 0205  WBC 10.9*  11.7*  NEUTROABS 9.1*  --   HGB 11.4* 9.8*  HCT 35.9* 30.7*  MCV 89.5 89.0  PLT 376 330   Cardiac Enzymes:  Recent Labs Lab 08/28/14 1459 08/28/14 2025 08/29/14 0205  TROPONINI 4.08* 5.52* 7.10*   BNP: Invalid input(s): POCBNP CBG:  Recent Labs Lab 08/28/14 1653 08/28/14 2141 08/29/14 0811 08/29/14 1139 08/29/14 1701  GLUCAP 172* 171* 162* 143* 131*    Recent Results (from the past 240 hour(s))  Urine culture     Status: None   Collection Time: 08/21/14  2:01 AM  Result Value Ref Range Status   Specimen Description URINE, CATHETERIZED  Final   Special Requests NONE  Final   Colony Count   Final    80,000 COLONIES/ML Performed at [facility]    Culture   Final    Multiple bacterial morphotypes present, none predominant. Suggest appropriate recollection if clinically indicated. Performed at [facility]    Report Status 08/22/2014 FINAL  Final  Urine culture     Status: None (Preliminary result)   Collection Time: 08/28/14 11:51 AM  Result Value Ref Range Status   Specimen Description URINE, CATHETERIZED  Final   Special Requests NONE  Final   Colony Count   Final    >=  100,000 COLONIES/ML Performed at [facility]    Culture   Final    GRAM NEGATIVE RODS Performed at [facility]    Report Status PENDING  Incomplete  Blood Culture (routine x 2)     Status: None (Preliminary result)   Collection Time: 08/28/14 12:04 PM  Result Value Ref Range Status   Specimen Description BLOOD LEFT ANTECUBITAL  Final   Special Requests BOTTLES DRAWN AEROBIC AND ANAEROBIC  Final   Culture   Final           BLOOD CULTURE RECEIVED NO GROWTH TO DATE CULTURE WILL BE HELD FOR 5 DAYS BEFORE ISSUING [name] FINAL NEGATIVE REPORT Performed at [facility]    Report Status PENDING  Incomplete  Blood Culture (routine x 2)     Status: None (Preliminary result)   Collection Time: 08/28/14 12:05 PM  Result Value Ref Range Status   Specimen  Description BLOOD RIGHT ANTECUBITAL  Final   Special Requests BOTTLES DRAWN AEROBIC AND ANAEROBIC  Final   Culture   Final           BLOOD CULTURE RECEIVED NO GROWTH TO DATE CULTURE WILL BE HELD FOR 5 DAYS BEFORE ISSUING [name] FINAL NEGATIVE REPORT Performed at [facility]    Report Status PENDING  Incomplete  MRSA PCR Screening     Status: None   Collection Time: 08/28/14  4:34 PM  Result Value Ref Range Status   MRSA by PCR NEGATIVE NEGATIVE Final    Comment:        The GeneXpert MRSA Assay (FDA approved for NASAL specimens only), is one component of [name] comprehensive MRSA colonization surveillance program. It is not intended to diagnose MRSA infection nor to guide or monitor treatment for MRSA infections.      Scheduled Meds: . aspirin  300 mg Rectal Daily  . clopidogrel  75 mg Oral Daily  . enoxaparin (LOVENOX) injection  30 mg Subcutaneous Q24H  . famotidine (PEPCID) IV  20 mg Intravenous Q24H  . furosemide  40 mg Intravenous BID  . insulin aspart  0-9 Units Subcutaneous TID WC  . metoprolol  5 mg Intravenous 4 times per day  . piperacillin-tazobactam (ZOSYN)  IV  2.25 g Intravenous Q6H  . sodium chloride  3 mL Intravenous Q12H  . [START ON 08/30/2014] vancomycin  1,000 mg Intravenous Q48H   Continuous Infusions: . sodium chloride 10 mL/hr at 08/28/14 1626  . diltiazem (CARDIZEM) infusion 7.5 mg/hr (08/29/14 1434)     Yui Mulvaney, DO  [facility] Pager [phone]  If 7PM-7AM, please contact night-coverage [email or web address] Password [facility] 08/29/2014, 6:27 PM   LOS: 1 day

## 2014-08-29 NOTE — Progress Notes (Addendum)
Patient ID: [name], female   DOB: 02/19/1925, 81 y.o.   MRN: [id number]    Subjective:  Advanced dementia no obvious complaints   Objective:  Filed Vitals:   08/29/14 0500 08/29/14 0600 08/29/14 0700 08/29/14 0800  BP: 113/47 110/51 113/44 127/54  Pulse: 115 96 94 93  Temp:      TempSrc:      Resp: 27 21 25 24   Height:      Weight:      SpO2: 92% 95% 94% 96%    Intake/Output from previous day:  Intake/Output Summary (Last 24 hours) at 08/29/14 0850 Last data filed at 08/29/14 0831  Gross per 24 hour  Intake 1748.92 ml  Output      6 ml  Net 1742.92 ml    Physical Exam: Not communicative this am  Agitated moving in bed Pale chronically ill white female  HEENT: normal Neck supple with no adenopathy JVP normal no bruits no thyromegaly Lungs clear with no wheezing and good diaphragmatic motion Heart:  S1/S2 SEM murmur, no rub, gallop or click PMI normal Abdomen: benighn, BS positve, no tenderness, no AAA no bruit.  No HSM or HJR Distal pulses intact with no bruits No edema Neuro non-focal Skin warm and dry No muscular weakness   Lab Results: Basic Metabolic Panel:  Recent Labs  40/98/1103/21/16 1504 08/29/14 0205  NA 142 146*  K 4.5 3.8  CL 114* 113*  CO2 19 19  GLUCOSE 217* 159*  BUN 58* 64*  CREATININE 1.96* 2.10*  CALCIUM 8.3* 8.5   Liver Function Tests:  Recent Labs  08/28/14 1134 08/28/14 1504  AST 21 22  ALT 18 17  ALKPHOS 159* 145*  BILITOT 0.4 0.6  PROT 7.7 6.9  ALBUMIN 3.4* 2.9*   CBC:  Recent Labs  08/28/14 1134 08/29/14 0205  WBC 10.9* 11.7*  NEUTROABS 9.1*  --   HGB 11.4* 9.8*  HCT 35.9* 30.7*  MCV 89.5 89.0  PLT 376 330   Cardiac Enzymes:  Recent Labs  08/28/14 1459 08/28/14 2025 08/29/14 0205  TROPONINI 4.08* 5.52* 7.10*    Imaging: Dg Chest Port 1 View  08/28/2014   CLINICAL DATA:  Shortness of Breath  EXAM: PORTABLE CHEST - 1 VIEW  COMPARISON:  08/21/2014  FINDINGS: Cardiac shadow is stable. Increased  central vascularity with both alveolar and interstitial edema is noted consistent with CHF. Followup films following appropriate therapy recommended. No bony abnormality is seen.  IMPRESSION: Changes consistent with CHF with pulmonary edema.   Electronically Signed   By: Alcide CleverMark  Lukens M.D.   On: 08/28/2014 12:09    Cardiac Studies:  ECG: afib rate 128  LVH  Diffuse T wave changes    Telemetry:  aiib  Rates 90-110   Echo:  Pending   Medications:   . aspirin  300 mg Rectal Daily  . enoxaparin (LOVENOX) injection  30 mg Subcutaneous Q24H  . famotidine (PEPCID) IV  20 mg Intravenous Q24H  . furosemide  40 mg Intravenous BID  . insulin aspart  0-9 Units Subcutaneous TID WC  . metoprolol  25 mg Oral BID  . piperacillin-tazobactam (ZOSYN)  IV  3.375 g Intravenous Q8H  . propafenone  225 mg Oral BID  . sodium chloride  3 mL Intravenous Q12H  . vancomycin  1,000 mg Intravenous Q24H     . sodium chloride 10 mL/hr at 08/28/14 1626  . diltiazem (CARDIZEM) infusion 5 mg/hr (08/29/14 0826)    Assessment/Plan:  Afib:  See note by Dr Myrtis Ser  No a candidate for anticoagulation continue just Glade lovenox  D/C propafenone in setting of SEMI  Increase beta blocker  CHF:  Comfortable sats ok continue lasix Resp:  Consider simplifying antibiotic coverage f/u CXR SEMI:  Add plavix  Not a candidate for invasive evaluation due to advanced age, DNR and dementia with renal failure  Echo pending   Charlton Haws 08/29/2014, 8:50 AM

## 2014-08-29 NOTE — Evaluation (Addendum)
Clinical/Bedside Swallow Evaluation Patient Details  Name: [name] MRN: [id number] Date of Birth: 07/27/1924  Today's Date: 08/29/2014 Time: SLP Start Time (ACUTE ONLY): 1000 SLP Stop Time (ACUTE ONLY): 1015 SLP Time Calculation (min) (ACUTE ONLY): 15 min  Past Medical History:  Past Medical History  Diagnosis Date  . Falls frequently   . Cardiac dysrhythmia   . Anemia   . Organic brain syndrome   . Difficulty walking   . Acute renal failure   . Azotemia   . Hyperkalemia   . UTI (lower urinary tract infection)   . Syncope   . Hypertension   . Acid reflux   . Alzheimer disease   . Diabetes mellitus without complication   . UTI (lower urinary tract infection)   . Dysphagia, oropharyngeal phase   . Dementia, unspecified, without behavioral disturbance   . Other specified cardiac dysrhythmias(427.89)    Past Surgical History:  Past Surgical History  Procedure Laterality Date  . Joint replacement      right hip  . Joint replacement      left hip 2012  . Cardiac surgery      stent  . Appendectomy    . Cholecystectomy    . Abdominal hysterectomy     HPI:  81 yo [facility] with DM2 - AMS - with dyspnea. Pt with PMH + for dementia, bronchitis, HTN, recent aspiration episode approximately 2 weeks ago, went into Afib in house- ? h/o Afib.  Pt transferred to ICU due to respiratory and cardiac issues.  Swallow evaluation ordered.  Prior to admit pt resided at [facility].     Assessment / Plan / Recommendation Clinical Impression  Pt presents with severe cognitive based dysphagia - she is resistant to accept po and will not assist to self feed.  Pt did accept single tsp of applejuice with delayed swallow response, increased HR and prolonged apnea.  Delayed cough noted after swallowing concerning for aspiration.  Pt's HR increased with pt movement as well.  Pt is very high aspiration risk with any po at this time.    Recommend strict NPO with oral care due to level of  dysphagia.  Concern for swallow to improve significantly due to premorbid dementia and aspiration episode.  RN reports pt did not elicit swallow last night with pills - whole or crushed and medicine held today due to concerns for aspiration.  RN informed of today's findings.    SLP to follow up briefly to determine readiness for po and for family education.       Aspiration Risk  Severe    Diet Recommendation NPO   Medication Administration: Via alternative means    Other  Recommendations Oral Care Recommendations: Oral care Q4 per protocol   Follow Up Recommendations   (TBD)    Frequency and Duration min 2x/week  1 week   Pertinent Vitals/Pain Febrile, rales     Swallow Study Prior Functional Status   resides at ALF prior to admit    General Date of Onset: 08/29/14 HPI: 81 [facility] DM[facility] - AMS - with dyspnea. Pt with PMH + for dementia, bronchitis, HTN, recent aspiration episode approximately 2 weeks ago, went into Afib in house- ? h/o Afib.  Pt transferred to ICU due to respiratory and cardiac issues.  Swallow evaluation ordered.  Prior to admit pt resided at [facility].   Type of Study: Bedside swallow evaluation Diet Prior to this Study: Dysphagia 2 (  chopped);Thin liquids Temperature Spikes Noted: No Respiratory Status: Room air History of Recent Intubation: No Behavior/Cognition: Alert;Confused;Agitated;Decreased sustained attention Oral Cavity - Dentition: Adequate natural dentition Self-Feeding Abilities: Able to feed self Patient Positioning: Upright in bed Baseline Vocal Quality: Clear Volitional Cough: Cognitively unable to elicit Volitional Swallow: Unable to elicit    Oral/Motor/Sensory Function Overall Oral Motor/Sensory Function:  (pt with generalized weakness)   Ice Chips Ice chips: Not tested   Thin Liquid Thin Liquid: Impaired Presentation: Spoon;Self Fed Oral Phase Impairments: Impaired anterior to posterior transit;Reduced lingual  movement/coordination Oral Phase Functional Implications: Prolonged oral transit Pharyngeal  Phase Impairments: Suspected delayed Swallow;Cough - Delayed    Nectar Thick Nectar Thick Liquid: Not tested   Honey Thick Honey Thick Liquid: Not tested   Puree Puree: Not tested   Solid   GO    Solid: Not tested       Donavan Burnet, MS [facility] SLP [phone]     MD informed to findings via text page at 1037 am

## 2014-08-29 NOTE — Progress Notes (Signed)
  Echocardiogram 2D Echocardiogram has been performed.  Janalyn HarderWest, Blinda Turek R 08/29/2014, 4:18 PM

## 2014-08-30 DIAGNOSIS — R0602 Shortness of breath: Secondary | ICD-10-CM

## 2014-08-30 DIAGNOSIS — F0390 Unspecified dementia without behavioral disturbance: Secondary | ICD-10-CM

## 2014-08-30 DIAGNOSIS — E119 Type 2 diabetes mellitus without complications: Secondary | ICD-10-CM

## 2014-08-30 LAB — BASIC METABOLIC PANEL
Anion gap: 18 — ABNORMAL HIGH (ref 5–15)
BUN: 59 mg/dL — AB (ref 6–23)
CALCIUM: 8.6 mg/dL (ref 8.4–10.5)
CO2: 20 mmol/L (ref 19–32)
CREATININE: 2.44 mg/dL — AB (ref 0.50–1.10)
Chloride: 111 mmol/L (ref 96–112)
GFR calc Af Amer: 19 mL/min — ABNORMAL LOW (ref >90)
GFR, EST NON AFRICAN AMERICAN: 17 mL/min — AB (ref >90)
GLUCOSE: 186 mg/dL — AB (ref 70–99)
Potassium: 3.1 mmol/L — ABNORMAL LOW (ref 3.5–5.1)
Sodium: 149 mmol/L — ABNORMAL HIGH (ref 135–145)

## 2014-08-30 LAB — CBC
HCT: 32.8 % — ABNORMAL LOW (ref 36.0–46.0)
Hemoglobin: 10.4 g/dL — ABNORMAL LOW (ref 12.0–15.0)
MCH: 28.1 pg (ref 26.0–34.0)
MCHC: 31.7 g/dL (ref 30.0–36.0)
MCV: 88.6 fL (ref 78.0–100.0)
PLATELETS: 336 10*3/uL (ref 150–400)
RBC: 3.7 MIL/uL — AB (ref 3.87–5.11)
RDW: 14.3 % (ref 11.5–15.5)
WBC: 11.6 10*3/uL — AB (ref 4.0–10.5)

## 2014-08-30 LAB — GLUCOSE, CAPILLARY
GLUCOSE-CAPILLARY: 180 mg/dL — AB (ref 70–99)
GLUCOSE-CAPILLARY: 148 mg/dL — AB (ref 70–99)
Glucose-Capillary: 153 mg/dL — ABNORMAL HIGH (ref 70–99)

## 2014-08-30 LAB — HEMOGLOBIN A1C
HEMOGLOBIN A1C: 6.9 % — AB (ref 4.8–5.6)
Mean Plasma Glucose: 151 mg/dL

## 2014-08-30 LAB — PROCALCITONIN: Procalcitonin: 0.19 ng/mL

## 2014-08-30 LAB — MAGNESIUM: Magnesium: 2.1 mg/dL (ref 1.5–2.5)

## 2014-08-30 MED ORDER — METOPROLOL TARTRATE 1 MG/ML IV SOLN
7.5000 mg | Freq: Four times a day (QID) | INTRAVENOUS | Status: DC
Start: 2014-08-30 — End: 2014-08-31
  Administered 2014-08-30 – 2014-08-31 (×5): 7.5 mg via INTRAVENOUS
  Filled 2014-08-30 (×5): qty 10

## 2014-08-30 MED ORDER — FUROSEMIDE 10 MG/ML IJ SOLN
40.0000 mg | Freq: Every day | INTRAMUSCULAR | Status: DC
  Administered 2014-08-31: 40 mg via INTRAVENOUS
  Filled 2014-08-30: qty 4

## 2014-08-30 MED ORDER — LORAZEPAM 2 MG/ML IJ SOLN
0.2500 mg | Freq: Once | INTRAMUSCULAR | Status: AC
  Administered 2014-08-30: 0.25 mg via INTRAVENOUS
  Filled 2014-08-30: qty 1

## 2014-08-30 NOTE — Progress Notes (Signed)
Patient ID: [name], female   DOB: December 20, 1924, 81 y.o.   MRN: [id number]    Subjective:  Advanced dementia no obvious complaints  No chest pain   Objective:  Filed Vitals:   08/30/14 0400 08/30/14 0500 08/30/14 0735 08/30/14 0800  BP:  143/77  145/63  Pulse:  120  101  Temp: 97.7 F (36.5 C)  98.1 F (36.7 C)   TempSrc: Axillary  Axillary   Resp:  25  21  Height:      Weight:  135 lb 9.3 oz (61.5 kg)    SpO2:  96%  96%    Intake/Output from previous day:  Intake/Output Summary (Last 24 hours) at 08/30/14 0850 Last data filed at 08/30/14 0800  Gross per 24 hour  Intake 474.54 ml  Output      0 ml  Net 474.54 ml    Physical Exam: Not communicative this am  Agitated moving in bed Pale chronically ill white female  HEENT: normal Neck supple with no adenopathy JVP normal no bruits no thyromegaly Lungs clear with no wheezing and good diaphragmatic motion Heart:  S1/S2 SEM murmur, no rub, gallop or click PMI normal Abdomen: benighn, BS positve, no tenderness, no AAA no bruit.  No HSM or HJR Distal pulses intact with no bruits No edema Neuro non-focal Skin warm and dry No muscular weakness   Lab Results: Basic Metabolic Panel:  Recent Labs  40/98/11 0205 08/30/14 0540  NA 146* 149*  K 3.8 3.1*  CL 113* 111  CO2 19 20  GLUCOSE 159* 186*  BUN 64* 59*  CREATININE 2.10* 2.44*  CALCIUM 8.5 8.6   Liver Function Tests:  Recent Labs  08/28/14 1134 08/28/14 1504  AST 21 22  ALT 18 17  ALKPHOS 159* 145*  BILITOT 0.4 0.6  PROT 7.7 6.9  ALBUMIN 3.4* 2.9*   CBC:  Recent Labs  08/28/14 1134 08/29/14 0205 08/30/14 0540  WBC 10.9* 11.7* 11.6*  NEUTROABS 9.1*  --   --   HGB 11.4* 9.8* 10.4*  HCT 35.9* 30.7* 32.8*  MCV 89.5 89.0 88.6  PLT 376 330 336   Cardiac Enzymes:  Recent Labs  08/28/14 1459 08/28/14 2025 08/29/14 0205  TROPONINI 4.08* 5.52* 7.10*    Imaging: Dg Chest Port 1 View  08/28/2014   CLINICAL DATA:  Shortness of Breath   EXAM: PORTABLE CHEST - 1 VIEW  COMPARISON:  08/21/2014  FINDINGS: Cardiac shadow is stable. Increased central vascularity with both alveolar and interstitial edema is noted consistent with CHF. Followup films following appropriate therapy recommended. No bony abnormality is seen.  IMPRESSION: Changes consistent with CHF with pulmonary edema.   Electronically Signed   By: Alcide Clever M.D.   On: 08/28/2014 12:09    Cardiac Studies:  ECG: afib rate 128  LVH  Diffuse T wave changes    Telemetry:  aiib  Rates 90-110  08/30/2014   Echo:  Pending   Medications:   . aspirin  300 mg Rectal Daily  . enoxaparin (LOVENOX) injection  30 mg Subcutaneous Q24H  . famotidine (PEPCID) IV  20 mg Intravenous Q24H  . furosemide  40 mg Intravenous BID  . insulin aspart  0-9 Units Subcutaneous TID WC  . metoprolol  7.5 mg Intravenous 4 times per day  . piperacillin-tazobactam (ZOSYN)  IV  2.25 g Intravenous Q6H  . sodium chloride  3 mL Intravenous Q12H     . sodium chloride 10 mL/hr at 08/29/14 2241  .  diltiazem (CARDIZEM) infusion 15 mg/hr (08/30/14 0700)    Assessment/Plan:  Afib:  See note by Dr Myrtis SerKatz  No a candidate for anticoagulation continue just [place] lovenox  D/C propafenone in setting of SEMI  On cardizem drip  CHF:  Comfortable sats ok continue lasix Resp:  Now on Zosyn only f/u CXR SEMI:  Add plavix  Not a candidate for invasive evaluation due to advanced age, DNR and dementia with renal failure  Echo reviewed and no RWMA;s EF 55%  Will sign off   Charlton Hawseter Marion Rosenberry 08/30/2014, 8:50 AM

## 2014-08-30 NOTE — Progress Notes (Signed)
PROGRESS NOTE  [name] MVH:[id number] DOB: 02-19-1925 DOA: 08/28/2014 PCP: Terald Sleeper, MD  HPI:  81 year old female with history of dementia, atrial fibrillation, CKD stage III, diabetes mellitus, and hypertension presented from [facility] memory care unit with increased work of breathing. At baseline, the patient is only able to speak 1 or 2 words, and she is wheelchair bound. Approximately one week prior to this admission, the patient came to the emergency department for choking episode. She was discharged from the emergency department stable condition. The patient was noted to have increased work of breathing on the morning of this admission. There's been no reports of vomiting, diarrhea, fevers, uncontrolled pain. In the emergency department, workup revealed serum creatinine 1.96, WBC 10.9. EKG showed atrial fibrillation with RVR. BNP was 1992. Chest x-ray showed pulmonary edema. ABG showed 7.38/30/68/17 on 6 L. The patient was initially thought to be septic and started on vancomycin and cefepime in the emergency department after blood cultures were obtained. She was also noted to have an point-of-care elevated troponin of 1.90. Lactic acid was 1.5. Assessment/Plan: Acute on chronic diastolic CHF -Patient w/o major findings suggesting massive fluid overload. -Continue intravenous furosemide 40 mg IV; but will change to once a day (there is signs of intravascular depletion) -continue daily weights and I's and O's -Echocardiogram--EF 55-60%, No WMA, mod MR, mild AS  Pulmonary infiltrates/Acute Respiratory Failure/Aspiration Pneumonia -O2 sat stable on 2-3 L -Certainly with the patient's clinical history of choking on her food she may have a component of aspiration contributing to the patient's chest x-ray findings  -continue zosyn  -patient failed swallowing evaluation  -will stop vanc  UTI -UA with TNTC WBC -urine culture = GNR -continue zosyn pending culture  data  acute on chronic renal failure CKD stage III -Baseline creatinine 1.0-1.4  -current Cr 2.44 -combination of intravascular depletion, UTI and CHF exacerbation -will monitor  Atrial fibrillation with RVR  -Continue diltiazem drip and metoprolol -metoprolol adjusted for better HR controlled -propafenone disocntinuedper cardiology -Continue DVT prophylaxis; not a candidate for chronic anticoagulation  -Continue aspirin supp for now  Elevated troponin/NSTEMI/demand ischemia -per cardiology rec's not a candidate for intervention; continue just medical management -discussed with pt's daughter (POA)-- understand no interventions will be offered   Dysphagia -will discussed with daughter current situation to safely take meds and nutrition; also the fact patient is already demonstrating signs intravascular depletion with development of hypernatremia -failed swallow eval -npo for now  diabetes mellitus type 2  -Hemoglobin A1c--6.9 -continue SSI   Dementia -at baseline per daughter -non-verbal; but able to follow simple commands  Hypernatremia -due to intravascular depletion and diuresis -will change lasix to once a day -follow BMET -patient failed swallowing evaluation  Goals of Care -DNR -discussed with daughter--plan is to adjust meds today and reevaluate swallowing. But overall prognosis and ability for nutrition/take meds is making patient a candidate for comfort care only.   Communication: daughter on phone 3/23 Dispo--remain in stepdown for now.   Procedures/Studies: Dg Chest 2 View (if Patient Has Fever And/or Copd)  08/21/2014   CLINICAL DATA:  Coughing and choking episode while drinking water  EXAM: CHEST  2 VIEW  COMPARISON:  05/04/2014  FINDINGS: There is mild enlargement of the pulmonary vasculature and mild interstitial thickening, a significant change from 05/04/2014. This likely represents a mild degree of congestive heart failure. There is no alveolar  edema. There are no effusions. Heart size is normal and  unchanged. Hilar and mediastinal contours are unremarkable and unchanged.  IMPRESSION: Mild CHF with vascular congestion and interstitial fluid. No alveolar edema.   Electronically Signed   By: Ellery Plunkaniel R Mitchell M.D.   On: 08/21/2014 01:19   Dg Chest Port 1 View  08/28/2014   CLINICAL DATA:  Shortness of Breath  EXAM: PORTABLE CHEST - 1 VIEW  COMPARISON:  08/21/2014  FINDINGS: Cardiac shadow is stable. Increased central vascularity with both alveolar and interstitial edema is noted consistent with CHF. Followup films following appropriate therapy recommended. No bony abnormality is seen.  IMPRESSION: Changes consistent with CHF with pulmonary edema.   Electronically Signed   By: Alcide CleverMark  Lukens M.D.   On: 08/28/2014 12:09    Subjective: Patient denies CP, nausea, vomiting or any other acute complaint. HR remains uncontrolled despite use of Cardizem and metoprolol. Per SPT recommendations patient is NPO.  Objective: Filed Vitals:   08/30/14 0300 08/30/14 0400 08/30/14 0500 08/30/14 0735  BP: 125/31  143/77   Pulse: 113  120   Temp:  97.7 F (36.5 C)  98.1 F (36.7 C)  TempSrc:  Axillary  Axillary  Resp: 22  25   Height:      Weight:   61.5 kg (135 lb 9.3 oz)   SpO2: 95%  96%     Intake/Output Summary (Last 24 hours) at 08/30/14 0840 Last data filed at 08/29/14 1434  Gross per 24 hour  Intake   45.5 ml  Output      0 ml  Net   45.5 ml   Weight change: -15.611 kg (-34 lb 6.7 oz) Exam:   General:  Pt is Alert and awake; non-verbal; afebrile. Was able to follow simple commands.  Cardiovascular: IRRR, S1/S2, no rubs, positive murmur    Respiratory: fair air movement; mild bibasilar crackles. No wheezing.  Abdomen: Soft/+BS, non tender, non distended, no guarding  Extremities: trace edema edema bilaterally, no cyanosis  Data Reviewed: Basic Metabolic Panel:  Recent Labs Lab 08/28/14 1134 08/28/14 1504 08/29/14 0205  08/30/14 0540  NA 146* 142 146* 149*  K 4.9 4.5 3.8 3.1*  CL 114* 114* 113* 111  CO2 18* 19 19 20   GLUCOSE 195* 217* 159* 186*  BUN 60* 58* 64* 59*  CREATININE 1.96* 1.96* 2.10* 2.44*  CALCIUM 9.1 8.3* 8.5 8.6   Liver Function Tests:  Recent Labs Lab 08/28/14 1134 08/28/14 1504  AST 21 22  ALT 18 17  ALKPHOS 159* 145*  BILITOT 0.4 0.6  PROT 7.7 6.9  ALBUMIN 3.4* 2.9*   CBC:  Recent Labs Lab 08/28/14 1134 08/29/14 0205 08/30/14 0540  WBC 10.9* 11.7* 11.6*  NEUTROABS 9.1*  --   --   HGB 11.4* 9.8* 10.4*  HCT 35.9* 30.7* 32.8*  MCV 89.5 89.0 88.6  PLT 376 330 336   Cardiac Enzymes:  Recent Labs Lab 08/28/14 1459 08/28/14 2025 08/29/14 0205  TROPONINI 4.08* 5.52* 7.10*   CBG:  Recent Labs Lab 08/29/14 0811 08/29/14 1139 08/29/14 1701 08/29/14 2137 08/30/14 0738  GLUCAP 162* 143* 131* 125* 180*    Recent Results (from the past 240 hour(s))  Urine culture     Status: None   Collection Time: 08/21/14  2:01 AM  Result Value Ref Range Status   Specimen Description URINE, CATHETERIZED  Final   Special Requests NONE  Final   Colony Count   Final    80,000 COLONIES/ML Performed at [facility]    Culture   Final  Multiple bacterial morphotypes present, none predominant. Suggest appropriate recollection if clinically indicated. Performed at [facility]    Report Status 08/22/2014 FINAL  Final  Urine culture     Status: None (Preliminary result)   Collection Time: 08/28/14 11:51 AM  Result Value Ref Range Status   Specimen Description URINE, CATHETERIZED  Final   Special Requests NONE  Final   Colony Count   Final    >=100,000 COLONIES/ML Performed at [facility]    Culture   Final    GRAM NEGATIVE RODS Performed at [facility]    Report Status PENDING  Incomplete  Blood Culture (routine x 2)     Status: None (Preliminary result)   Collection Time: 08/28/14 12:04 PM  Result Value Ref Range Status    Specimen Description BLOOD LEFT ANTECUBITAL  Final   Special Requests BOTTLES DRAWN AEROBIC AND ANAEROBIC  Final   Culture   Final           BLOOD CULTURE RECEIVED NO GROWTH TO DATE CULTURE WILL BE HELD FOR 5 DAYS BEFORE ISSUING A FINAL NEGATIVE REPORT Performed at [facility]    Report Status PENDING  Incomplete  Blood Culture (routine x 2)     Status: None (Preliminary result)   Collection Time: 08/28/14 12:05 PM  Result Value Ref Range Status   Specimen Description BLOOD RIGHT ANTECUBITAL  Final   Special Requests BOTTLES DRAWN AEROBIC AND ANAEROBIC  Final   Culture   Final           BLOOD CULTURE RECEIVED NO GROWTH TO DATE CULTURE WILL BE HELD FOR 5 DAYS BEFORE ISSUING A FINAL NEGATIVE REPORT Performed at [facility]    Report Status PENDING  Incomplete  MRSA PCR Screening     Status: None   Collection Time: 08/28/14  4:34 PM  Result Value Ref Range Status   MRSA by PCR NEGATIVE NEGATIVE Final    Comment:        The GeneXpert MRSA Assay (FDA approved for NASAL specimens only), is one component of a comprehensive MRSA colonization surveillance program. It is not intended to diagnose MRSA infection nor to guide or monitor treatment for MRSA infections.      Scheduled Meds: . aspirin  300 mg Rectal Daily  . enoxaparin (LOVENOX) injection  30 mg Subcutaneous Q24H  . famotidine (PEPCID) IV  20 mg Intravenous Q24H  . [START ON 08/31/2014] furosemide  40 mg Intravenous Daily  . insulin aspart  0-9 Units Subcutaneous TID WC  . metoprolol  7.5 mg Intravenous 4 times per day  . piperacillin-tazobactam (ZOSYN)  IV  2.25 g Intravenous Q6H  . sodium chloride  3 mL Intravenous Q12H   Continuous Infusions: . sodium chloride 10 mL/hr at 08/29/14 2241  . diltiazem (CARDIZEM) infusion 15 mg/hr (08/30/14 0700)     Vassie Loll, MD [facility] Pager [phone]  If 7PM-7AM, please contact night-coverage [email or web address] Password [email or web address] 08/30/2014,  8:40 AM   LOS: 2 days

## 2014-08-30 NOTE — Progress Notes (Signed)
Speech Language Pathology Treatment: Dysphagia  Patient Details Name: [name] MRN: [id number] DOB: 02/15/1925 Today's Date: 08/30/2014 Time: [id number] SLP Time Calculation (min) (ACUTE ONLY): 22 min  Assessment / Plan / Recommendation Clinical Impression  Pt continues with severe cognitive based dysphagia again being very resistant to oral care and po.  Noted pt with copious viscous secretions in posterior oral cavity that SLP removed using moist toothette to loosen and oral suction.  Pt with tiny abrasion after secretion removal on roof of mouth -  Left upper.  Informed RN.  SLP offered pt applejuice to which she sealed her lips tightly and turned her head away.    Recommend npo with good oral care due to pt open mouth breathing and being on oxygen increasing drying of secretions.  Concern for swallow to improve significantly due to premorbid dementia and recent aspiration episode.   HPI HPI: 81 [facility] - AMS - with dyspnea. Pt with PMH + for dementia, bronchitis, HTN, recent aspiration episode approximately 2 weeks ago, went into Afib in house- ? h/o Afib.  Pt transferred to ICU due to respiratory and cardiac issues.  Swallow evaluation ordered.  Prior to admit pt resided at [facility].     Pertinent Vitals Pain Assessment:  (pt moans with SLP oral care and/or pt witnessing SLP putting on gloves, mostly ceased upon completion of evaluation )  SLP Plan  Continue with current plan of care (note plans for medication adaption and reassess swallowing if not improvement transition to comfort care per md note)    Recommendations Diet recommendations: NPO Medication Administration: Via alternative means              Oral Care Recommendations: Oral care Q4 per protocol Follow up Recommendations:  (TBD) Plan: Continue with current plan of care (note plans for medication adaption and reassess swallowing if not improvement transition to comfort care per md note)     GO     Donavan Burnetamara Raylyn Carton, MS [facility] SLP [phone]

## 2014-08-30 NOTE — Progress Notes (Signed)
eLink Physician-Brief Progress Note Patient Name: Andrea MerlFreda A [name] DOB: 12/05/1924 MRN: [id number]   Date of Service  08/30/2014  HPI/Events of Note  Mcro reviewed And ci called [facility] and asked them to add a zosyn disc to peoteus  eICU Interventions       Intervention Category Major Interventions: Infection - evaluation and management  Nelda BucksFEINSTEIN,DANIEL J. 08/30/2014, 4:13 PM

## 2014-08-31 DIAGNOSIS — Z515 Encounter for palliative care: Secondary | ICD-10-CM

## 2014-08-31 DIAGNOSIS — E43 Unspecified severe protein-calorie malnutrition: Secondary | ICD-10-CM

## 2014-08-31 LAB — BASIC METABOLIC PANEL
Anion gap: 15 (ref 5–15)
BUN: 53 mg/dL — AB (ref 6–23)
CO2: 23 mmol/L (ref 19–32)
CREATININE: 2.4 mg/dL — AB (ref 0.50–1.10)
Calcium: 8.4 mg/dL (ref 8.4–10.5)
Chloride: 114 mmol/L — ABNORMAL HIGH (ref 96–112)
GFR calc Af Amer: 20 mL/min — ABNORMAL LOW (ref >90)
GFR, EST NON AFRICAN AMERICAN: 17 mL/min — AB (ref >90)
Glucose, Bld: 163 mg/dL — ABNORMAL HIGH (ref 70–99)
Potassium: 3 mmol/L — ABNORMAL LOW (ref 3.5–5.1)
Sodium: 152 mmol/L — ABNORMAL HIGH (ref 135–145)

## 2014-08-31 LAB — GLUCOSE, CAPILLARY
Glucose-Capillary: 167 mg/dL — ABNORMAL HIGH (ref 70–99)
Glucose-Capillary: 173 mg/dL — ABNORMAL HIGH (ref 70–99)
Glucose-Capillary: 118 mg/dL — ABNORMAL HIGH (ref 70–99)

## 2014-08-31 MED ORDER — LORAZEPAM 2 MG/ML IJ SOLN
0.2500 mg | INTRAMUSCULAR | Status: DC | PRN
  Administered 2014-08-31: 0.25 mg via INTRAVENOUS
  Filled 2014-08-31: qty 1

## 2014-08-31 MED ORDER — FUROSEMIDE 10 MG/ML IJ SOLN: 20.0000 mg | Freq: Every day | INTRAMUSCULAR | Status: DC

## 2014-08-31 MED ORDER — FENTANYL CITRATE 0.05 MG/ML IJ SOLN
25.0000 ug | INTRAMUSCULAR | Status: DC | PRN
  Administered 2014-08-31: 25 ug via INTRAVENOUS
  Filled 2014-08-31: qty 2

## 2014-08-31 MED ORDER — HYDROMORPHONE HCL 1 MG/ML IJ SOLN
0.5000 mg | INTRAMUSCULAR | Status: DC | PRN
  Administered 2014-09-01 – 2014-09-02 (×4): 0.5 mg via INTRAVENOUS
  Filled 2014-08-31 (×5): qty 1

## 2014-08-31 MED ORDER — HYDROMORPHONE HCL 1 MG/ML IJ SOLN
1.0000 mg | Freq: Once | INTRAMUSCULAR | Status: AC
  Administered 2014-08-31: 1 mg via INTRAVENOUS

## 2014-08-31 MED ORDER — LORAZEPAM 2 MG/ML IJ SOLN
1.0000 mg | INTRAMUSCULAR | Status: DC | PRN
  Administered 2014-08-31 – 2014-09-02 (×6): 1 mg via INTRAVENOUS
  Filled 2014-08-31 (×6): qty 1

## 2014-08-31 MED ORDER — HALOPERIDOL LACTATE 5 MG/ML IJ SOLN
1.0000 mg | Freq: Four times a day (QID) | INTRAMUSCULAR | Status: DC | PRN
  Administered 2014-08-31: 1 mg via INTRAVENOUS
  Filled 2014-08-31: qty 1

## 2014-08-31 MED ORDER — POTASSIUM CHLORIDE 10 MEQ/100ML IV SOLN
10.0000 meq | INTRAVENOUS | Status: DC
Start: 2014-08-31 — End: 2014-08-31
  Administered 2014-08-31 (×2): 10 meq via INTRAVENOUS
  Filled 2014-08-31 (×3): qty 100

## 2014-08-31 MED ORDER — GLYCOPYRROLATE 0.2 MG/ML IJ SOLN
0.1000 mg | INTRAMUSCULAR | Status: DC | PRN
  Filled 2014-08-31: qty 0.5

## 2014-08-31 NOTE — Progress Notes (Addendum)
PROGRESS NOTE  [name] WUJ:[id number] DOB: 1925/03/26 DOA: 08/28/2014 PCP: Terald Sleeper, MD  HPI:  81 year old female with history of dementia, atrial fibrillation, CKD stage III, diabetes mellitus, and hypertension presented from [facility] memory care unit with increased work of breathing. At baseline, the patient is only able to speak 1 or 2 words, and she is wheelchair bound. Approximately one week prior to this admission, the patient came to the emergency department for choking episode. She was discharged from the emergency department stable condition. The patient was noted to have increased work of breathing on the morning of this admission. There's been no reports of vomiting, diarrhea, fevers, uncontrolled pain. In the emergency department, workup revealed serum creatinine 1.96, WBC 10.9. EKG showed atrial fibrillation with RVR. BNP was 1992. Chest x-ray showed pulmonary edema. ABG showed 7.38/30/68/17 on 6 L. The patient was initially thought to be septic and started on vancomycin and cefepime in the emergency department after blood cultures were obtained. She was also noted to have an point-of-care elevated troponin of 1.90. Lactic acid was 1.5.  Assessment/Plan: Acute on chronic diastolic CHF -Patient w/o major findings suggesting massive fluid overload. -Continue intravenous furosemide 40 mg IV; but will change to once a day (there is signs of intravascular depletion) -continue daily weights and I's and O's -Echocardiogram--EF 55-60%, No WMA, mod MR, mild AS  Pulmonary infiltrates/Acute Respiratory Failure/Aspiration Pneumonia -O2 sat stable on 2-3 L -Certainly with the patient's clinical history of choking on her food she may have a component of aspiration contributing to the patient's chest x-ray findings  -continue zosyn  -patient failed swallowing evaluation  -will stop vanc  Proteus mirabilis UTI -UA with TNTC WBC -continue zosyn pending sensitivity     acute on chronic renal failure CKD stage III -Baseline creatinine 1.0-1.4  -current Cr 2.42 -combination of intravascular depletion, UTI and CHF exacerbation -will monitor  Atrial fibrillation with RVR  -Continue diltiazem drip and metoprolol -metoprolol adjusted for better HR controlled -propafenone disocntinuedper cardiology -Continue DVT prophylaxis; not a candidate for chronic anticoagulation  -Continue aspirin supp for now  Elevated troponin/NSTEMI/demand ischemia -per cardiology rec's not a candidate for intervention; continue just medical management -discussed with pt's daughter (POA)-- understand no interventions will be offered   Dysphagia -Has discussed with daughter current situation to safely take meds and nutrition; also the fact patient is already demonstrating signs intravascular depletion with development of hypernatremia -failed swallow eval -remains npo for now -family don't want PEG tube  diabetes mellitus type 2  -Hemoglobin A1c--6.9 -continue SSI   Dementia -at baseline per daughter -non-verbal; but able to follow simple commands at abseline  Hypernatremia -due to intravascular depletion and diuresis -will change lasix to just  daily -follow BMET -patient failed swallowing evaluation  Hypokalemia -will replete  Protein calorie malnutrition, severe: due to acute illness and NPO status -family do not want PEG -patient high risk aspiration and has failed swallowing test twice -at this moment will involve palliative care and hopefully move towards comfort care  Goals of Care -DNR -discussed with daughter--plan is to involve palliative care and hopefully explore/answer question and concerns regarding hospice. Prognosis is poor and patient has not make significant progress despite acute intervention.  -most likely will transfer to med-surg tomorrow, plan is tentatively comfort care   Communication: daughter on phone 3/24 Dispo--remain in  stepdown for now. Will involve Palliative care   Procedures/Studies: Dg Chest 2 View (if Patient Has  Fever And/or Copd)  08/21/2014   CLINICAL DATA:  Coughing and choking episode while drinking water  EXAM: CHEST  2 VIEW  COMPARISON:  05/04/2014  FINDINGS: There is mild enlargement of the pulmonary vasculature and mild interstitial thickening, a significant change from 05/04/2014. This likely represents a mild degree of congestive heart failure. There is no alveolar edema. There are no effusions. Heart size is normal and unchanged. Hilar and mediastinal contours are unremarkable and unchanged.  IMPRESSION: Mild CHF with vascular congestion and interstitial fluid. No alveolar edema.   Electronically Signed   By: Ellery Plunk M.D.   On: 08/21/2014 01:19   Dg Chest Port 1 View  08/28/2014   CLINICAL DATA:  Shortness of Breath  EXAM: PORTABLE CHEST - 1 VIEW  COMPARISON:  08/21/2014  FINDINGS: Cardiac shadow is stable. Increased central vascularity with both alveolar and interstitial edema is noted consistent with CHF. Followup films following appropriate therapy recommended. No bony abnormality is seen.  IMPRESSION: Changes consistent with CHF with pulmonary edema.   Electronically Signed   By: Alcide Clever M.D.   On: 08/28/2014 12:09    Subjective: Patient HR continue to be a moving target; even overall is better controlled. No fever. Complaining of oral pain and unable to have proper swallowing test due to inability to follow commands and selective decision of not opening mouth  Objective: Filed Vitals:   08/31/14 1200 08/31/14 1300 08/31/14 1400 08/31/14 1600  BP: 145/61 149/57 147/82   Pulse:  54    Temp: 98.6 F (37 C)   98.2 F (36.8 C)  TempSrc: Axillary   Oral  Resp: 27 25    Height:      Weight:      SpO2:  95%      Intake/Output Summary (Last 24 hours) at 08/31/14 1647 Last data filed at 08/31/14 0600  Gross per 24 hour  Intake    335 ml  Output      0 ml  Net    335 ml     Weight change: -1.6 kg (-3 lb 8.4 oz) Exam:   General:  Pt is Awake, but unable to assess orientation due to mutism. She was able to answer intermittently yes or no questions with her head and specifically complaints of oral pain. Vital signs are stable.  Cardiovascular: IRRR, S1/S2, no rubs, positive murmur    Respiratory: fair air movement; diminish BS at bases, but no frank crackles. No wheezing.  Abdomen: Soft/+BS, non tender, non distended, no guarding  Extremities: trace edema edema bilaterally, no cyanosis  Data Reviewed: Basic Metabolic Panel:  Recent Labs Lab 08/28/14 1134 08/28/14 1504 08/29/14 0205 08/30/14 0540 08/31/14 0350  NA 146* 142 146* 149* 152*  K 4.9 4.5 3.8 3.1* 3.0*  CL 114* 114* 113* 111 114*  CO2 18* 19 19 20 23   GLUCOSE 195* 217* 159* 186* 163*  BUN 60* 58* 64* 59* 53*  CREATININE 1.96* 1.96* 2.10* 2.44* 2.40*  CALCIUM 9.1 8.3* 8.5 8.6 8.4  MG  --   --   --  2.1  --    Liver Function Tests:  Recent Labs Lab 08/28/14 1134 08/28/14 1504  AST 21 22  ALT 18 17  ALKPHOS 159* 145*  BILITOT 0.4 0.6  PROT 7.7 6.9  ALBUMIN 3.4* 2.9*   CBC:  Recent Labs Lab 08/28/14 1134 08/29/14 0205 08/30/14 0540  WBC 10.9* 11.7* 11.6*  NEUTROABS 9.1*  --   --   HGB 11.4* 9.8*  10.4*  HCT 35.9* 30.7* 32.8*  MCV 89.5 89.0 88.6  PLT 376 330 336   Cardiac Enzymes:  Recent Labs Lab 08/28/14 1459 08/28/14 2025 08/29/14 0205  TROPONINI 4.08* 5.52* 7.10*   CBG:  Recent Labs Lab 08/30/14 0738 08/30/14 1131 08/30/14 1533 08/31/14 0730 08/31/14 1210  GLUCAP 180* 153* 148* 167* 173*    Recent Results (from the past 240 hour(s))  Urine culture     Status: None   Collection Time: 08/28/14 11:51 AM  Result Value Ref Range Status   Specimen Description URINE, CATHETERIZED  Final   Special Requests NONE  Final   Colony Count >=100,000 COLONIES/ML  Final   Culture PROTEUS MIRABILIS  Final   Report Status 08/30/2014 FINAL  Final   Organism  ID, Bacteria PROTEUS MIRABILIS  Final      Susceptibility   Proteus mirabilis - MIC*    AMPICILLIN <=2 SENSITIVE Sensitive     CEFAZOLIN <=4 SENSITIVE Sensitive     CEFTRIAXONE <=1 SENSITIVE Sensitive     CIPROFLOXACIN >=4 RESISTANT Resistant     GENTAMICIN 8 INTERMEDIATE Intermediate     LEVOFLOXACIN >=8 RESISTANT Resistant     NITROFURANTOIN 128 RESISTANT Resistant     TOBRAMYCIN 2 SENSITIVE Sensitive     TRIMETH/SULFA Value in next row Resistant      >=320 RESISTANTPerformed at [facility]    * PROTEUS MIRABILIS  Blood Culture (routine x 2)     Status: None (Preliminary result)   Collection Time: 08/28/14 12:04 PM  Result Value Ref Range Status   Specimen Description BLOOD LEFT ANTECUBITAL  Final   Special Requests BOTTLES DRAWN AEROBIC AND ANAEROBIC  Final   Culture   Final           BLOOD CULTURE RECEIVED NO GROWTH TO DATE CULTURE WILL BE HELD FOR 5 DAYS BEFORE ISSUING A FINAL NEGATIVE REPORT Performed at [facility]    Report Status PENDING  Incomplete  Blood Culture (routine x 2)     Status: None (Preliminary result)   Collection Time: 08/28/14 12:05 PM  Result Value Ref Range Status   Specimen Description BLOOD RIGHT ANTECUBITAL  Final   Special Requests BOTTLES DRAWN AEROBIC AND ANAEROBIC  Final   Culture   Final           BLOOD CULTURE RECEIVED NO GROWTH TO DATE CULTURE WILL BE HELD FOR 5 DAYS BEFORE ISSUING A FINAL NEGATIVE REPORT Performed at [facility]    Report Status PENDING  Incomplete  MRSA PCR Screening     Status: None   Collection Time: 08/28/14  4:34 PM  Result Value Ref Range Status   MRSA by PCR NEGATIVE NEGATIVE Final    Comment:        The GeneXpert MRSA Assay (FDA approved for NASAL specimens only), is one component of a comprehensive MRSA colonization surveillance program. It is not intended to diagnose MRSA infection nor to guide or monitor treatment for MRSA infections.      Scheduled Meds: . aspirin   300 mg Rectal Daily  . enoxaparin (LOVENOX) injection  30 mg Subcutaneous Q24H  . famotidine (PEPCID) IV  20 mg Intravenous Q24H  . [START ON 09/01/2014] furosemide  20 mg Intravenous Daily  . insulin aspart  0-9 Units Subcutaneous TID WC  . metoprolol  7.5 mg Intravenous 4 times per day  . piperacillin-tazobactam (ZOSYN)  IV  2.25 g Intravenous Q6H  . sodium chloride  3 mL  Intravenous Q12H   Continuous Infusions: . sodium chloride 10 mL/hr at 08/31/14 1400  . diltiazem (CARDIZEM) infusion 15 mg/hr (08/31/14 0800)     [name], Andrea Smeltzer, MD [facility] Pager [phone]  If 7PM-7AM, please contact night-coverage [email or web address] Password [email or web address] 08/31/2014, 4:47 PM   LOS: 3 days

## 2014-08-31 NOTE — Progress Notes (Signed)
[facility] patient is transferring to room 1337. ABC's intact at this time.

## 2014-08-31 NOTE — Progress Notes (Signed)
Pt made to be comfort care only by the palliative care MD. All IV's have been stopped, monitors removed. Will continue to monitor. Close friend is at the bedside now.

## 2014-08-31 NOTE — Consult Note (Signed)
[facility]  Date: 08/31/2014   Patient Name: [name]  DOB: 05/13/1925  MRN: [id number]  Age / Sex: 81 y.o., female   PCP: Andrea CaulMichael G Robson, MD Referring Physician: Vassie Lollarlos Madera, MD  Active Problems: Active Problems:   Diabetes mellitus type 2 in nonobese   Advanced dementia   Acute on chronic diastolic CHF (congestive heart failure)   Acute on chronic renal failure   Acute respiratory failure   Elevated troponin   Paroxysmal atrial fibrillation   Protein-calorie malnutrition, severe   SOB (shortness of breath)   HPI/Reason for Consultation: [name] is a 81 y.o. female with FAST7 Alzheimer's dementia admitted with sepsis in setting of aspiration PNA (recurrent). 3 days of very aggressive medical interventions with minimal improvement- she is approaching EOL. PMT requested for goals of care.  Participants in Discussion: HCPOA: yes   Advance Directive:    Code Status Orders        Start     Ordered   08/28/14 1559  Do not attempt resuscitation (DNR)   Continuous    Question Answer Comment  In the event of cardiac or respiratory ARREST Do not call a "code blue"   In the event of cardiac or respiratory ARREST Do not perform Intubation, CPR, defibrillation or ACLS   In the event of cardiac or respiratory ARREST Use medication by any route, position, wound care, and other measures to relive pain and suffering. May use oxygen, suction and manual treatment of airway obstruction as needed for comfort.      08/28/14 1603       @ADVDIR @  I have reviewed the medical record, interviewed the patient and family, and examined the patient. The following aspects are pertinent.  Past Medical History  Diagnosis Date  . Falls frequently   . Cardiac dysrhythmia   . Anemia   . Organic brain syndrome   . Difficulty walking   . Acute renal failure   . Azotemia   . Hyperkalemia   . UTI (lower urinary tract infection)   . Syncope   . Hypertension    . Acid reflux   . Alzheimer disease   . Diabetes mellitus without complication   . UTI (lower urinary tract infection)   . Dysphagia, oropharyngeal phase   . Dementia, unspecified, without behavioral disturbance   . Other specified cardiac dysrhythmias(427.89)    History   Social History  . Marital Status: Widowed    Spouse Name: N/A  . Number of Children: N/A  . Years of Education: N/A   Social History Main Topics  . Smoking status: Never Smoker   . Smokeless tobacco: Never Used  . Alcohol Use: No  . Drug Use: No  . Sexual Activity: No   Other Topics Concern  . None   Social History Narrative   Family History  Problem Relation Age of Onset  . Diabetes Mother   . Dementia Brother   . Dementia Brother   . Fibromyalgia Daughter    Scheduled Meds: . sodium chloride  3 mL Intravenous Q12H   Continuous Infusions:  PRN Meds:.sodium chloride, acetaminophen, glycopyrrolate, HYDROmorphone (DILAUDID) injection, ipratropium-albuterol, LORazepam, ondansetron (ZOFRAN) IV, sodium chloride Allergies  Allergen Reactions  . Contrast Media [Iodinated Diagnostic Agents] Other (See Comments)    MAR  . Statins   . Sulfa Antibiotics Hives  . Vesicare [Solifenacin] Other (See Comments)    MAR   CBC:    Component Value Date/Time   WBC 11.6* 08/30/2014  0540   HGB 10.4* 08/30/2014 0540   HCT 32.8* 08/30/2014 0540   PLT 336 08/30/2014 0540   MCV 88.6 08/30/2014 0540   NEUTROABS 9.1* 08/28/2014 1134   LYMPHSABS 1.1 08/28/2014 1134   MONOABS 0.8 08/28/2014 1134   EOSABS 0.0 08/28/2014 1134   BASOSABS 0.0 08/28/2014 1134   Comprehensive Metabolic Panel:    Component Value Date/Time   NA 152* 08/31/2014 0350   K 3.0* 08/31/2014 0350   CL 114* 08/31/2014 0350   CO2 23 08/31/2014 0350   BUN 53* 08/31/2014 0350   CREATININE 2.40* 08/31/2014 0350   GLUCOSE 163* 08/31/2014 0350   CALCIUM 8.4 08/31/2014 0350   AST 22 08/28/2014 1504   ALT 17 08/28/2014 1504   ALKPHOS 145*  08/28/2014 1504   BILITOT 0.6 08/28/2014 1504   PROT 6.9 08/28/2014 1504   ALBUMIN 2.9* 08/28/2014 1504    Vital Signs: BP 113/68 mmHg  Pulse 105  Temp(Src) 98.2 F (36.8 C) (Oral)  Resp 23  Ht  (1.702 m)  Wt 59.9 kg (132 lb 0.9 oz)  BMI 20.68 kg/m2  SpO2 94% Filed Weights   08/29/14 0400 08/30/14 0500 08/31/14 0500  Weight: 59.6 kg (131 lb 6.3 oz) 61.5 kg (135 lb 9.3 oz) 59.9 kg (132 lb 0.9 oz)   03/23 0701 - 03/24 0700 In: 660 [I.V.:410; IV Piggyback:250] Out: -   Physical Exam:  Summary of Established Goals of Care and Medical Treatment Preferences Extensive conversation with her daughter- she is struggling with survivor guilt but knows her mother would NEVER want her life prolonged in her current condition- she fully endorses comfort care approach at this point. Active grief-needs SW/chaplain assistance.  Primary Diagnoses  1. Sepsis secondary to Aspiration PNA, CHF 2. FAST 7 Alzheimer's Dementia Active Symptoms: 1. Agitation 2. Pain  Psycho-social/Spiritual: Retired Engineer, site, Masters degree in Education- one daughter is only child-she has an advance directive that specifically requests that her life not be prolonged in situations where she has lost mental capacity.  Prognosis: <2 weeks   Palliative Performance Scale: 10%  Recommendations:  1. Code Status: DNR 2. Scope of Treatment:   Full comfort Care 3. Symptom Management:  1. Dilaudid 0.5mg  IV q2 prn pain and distress 2. Ativan prn for agitation 4. Palliative Prophylaxis:  1. Glycopyralate for secretions 5. Disposition:  Referral to [facility] ASAP   Time In: 5PM Time Out: 6:20PM Time Total: 80 minutes Greater than 50%  of this time was spent counseling and coordinating care related to the above assessment and plan.  Signed by: Andrea Odor, DO  08/31/2014, 5:59 PM  Please contact Palliative Medicine Team phone at [phone] for questions and  concerns.

## 2014-08-31 NOTE — Progress Notes (Signed)
CSW continuing to follow.   [facility]   Per MD note, overall prognosis and ability for nutrition/take meds is making patient a candidate for comfort care only and MD plans to have continued discussions surrounding goals of care with pt daughter.  CSW awaiting further recommendations and will continue to follow to assist as appropriate   Loletta SpecterSuzanna Wretha Laris, MSW, LCSW Clinical Social Work [phone]

## 2014-08-31 NOTE — Progress Notes (Signed)
SLP Cancellation Note  Patient Details Name: Leilani MerlFreda A Magnussen MRN: [id number] DOB: 02/28/1925   Cancelled treatment:       Reason Eval/Treat Not Completed: Other (comment) (pt not participating per RN is resistant to oral care and obtaining temperatures, will sign off, please reorder if indicated)   Donavan Burnetamara Imya Mance, MS [facility] S[facility] [phone]

## 2014-08-31 NOTE — Progress Notes (Signed)
[facility]   [facility] and pt daughter tomorrow.   CSW to continue to follow.  Loletta SpecterSuzanna Lil Lepage, MSW, LCSW Clinical Social Work [phone]

## 2014-09-01 DIAGNOSIS — T17998D Other foreign object in respiratory tract, part unspecified causing other injury, subsequent encounter: Secondary | ICD-10-CM

## 2014-09-01 DIAGNOSIS — N390 Urinary tract infection, site not specified: Secondary | ICD-10-CM

## 2014-09-01 LAB — URINE CULTURE: Colony Count: >=100000

## 2014-09-01 MED ORDER — POLYVINYL ALCOHOL 1.4 % OP SOLN
2.0000 [drp] | OPHTHALMIC | Status: DC | PRN
  Administered 2014-09-02 (×2): 2 [drp] via OPHTHALMIC
  Filled 2014-09-01: qty 15

## 2014-09-01 MED ORDER — ARTIFICIAL TEARS OP OINT
TOPICAL_OINTMENT | Freq: Three times a day (TID) | OPHTHALMIC | Status: DC
  Administered 2014-09-01 – 2014-09-03 (×6): via OPHTHALMIC
  Filled 2014-09-01: qty 3.5

## 2014-09-01 NOTE — Progress Notes (Addendum)
[facility] to follow up with referral. Per Arline Aspindy, [facility]. Bed may be available over the weekend.    Olga CoasterKristen Humzah Harty, LCSW  Clinical Social Work  [facility] [phone]    CSW discussed with attending and PMT attending. Per discusison with PMT, patient to be evaluated today to determine if pt is appropriate for transfer. CSW and PMT discussed that patient hospice home of choice is not available today and further options can be discussed with family. PMT to evaluate patient and follow up with CSW and attending regarding disposition.   Olga CoasterKristen Travis Purk, LCSW  Clinical Social Work  [facility] [phone]  Per PMT, patient may not make it through the weekend. CSW continuing to assist with disposition needs. [facility] over the weekend but depends on patient status. Attending aware.   Olga CoasterKristen Damin Salido, LCSW  Clinical Social Work  [facility] [phone]   [facility] if bed available and patient stable for transfer over the weekend.   Olga CoasterKristen Raesha Coonrod, LCSW  Clinical Social Work  [facility] [phone]

## 2014-09-01 NOTE — Progress Notes (Signed)
PROGRESS NOTE  [name] ZOX:[id number] DOB: 05-15-1925 DOA: 08/28/2014 PCP: Terald Sleeper, MD  Assessment/Plan: Acute on chronic diastolic CHF -Patient w/o major findings suggesting massive fluid overload. -Has developed intravascular depletion after diuresis. No major improvement in her overall health status -After goals of care meeting decision has been to transition patient to full comfort -At this moment no further diuresis will be provided -Echocardiogram--EF 55-60%, No WMA, mod MR, mild AS -Patient started on as needed by lauded, Ativan and when necessary nebulizer treatment  Pulmonary infiltrates/Acute Respiratory Failure/Aspiration Pneumonia -Comfort oxygen supplementation with [place] as needed 2-3 L -after 4 days of IV antibiotics patient was no longer febrile or demonstrating any elevation of her WBCs. She remains stable and significantly decline in her overall condition reason why she has now been switched to full comfort care. No further antibiotics will be provided.  Proteus mirabilis UTI -Sensitive to Zosyn; patient received 4 days of IV antibiotic without significant improvement  -At this moment she has been transitioned to full comfort and no further antibiotics will be provided.  acute on chronic renal failure CKD stage III -Baseline creatinine 1.0-1.4  -Last creatinine check demonstrated a level of 2.4 -At this moment care has been transitioned to full comfort and no further labs will be checked. -Patient will be treated symptomatically only  Atrial fibrillation with RVR  -Patient care has been transitioned to full comfort and at this moment beta blockers and diltiazem has been discontinue  Elevated troponin/NSTEMI/demand ischemia -per cardiology rec's not a candidate for intervention; continue just medical management -discussed with pt's daughter (POA)-- understand no interventions will be offered  -Plan is for full comfort  care  Dysphagia -Per GOC discussion outcome, patient care has now been transitioned to full comfort. -failed swallow eval twice -family has decline PEG tube -Dysphasia 1 diet with thin liquids for comfort feeding  diabetes mellitus type 2  -Hemoglobin A1c--6.9 -Given transition of care to full comfort we'll stop checking CBGs or providing sliding scale insulin treatment.  Dementia -at baseline per daughter she is non-verbal; but able to follow simple commands -Currently slightly lethargic, remains nonverbal but is unable to follow simple commands. -Continue supportive care and comfort for care  Hypernatremia -due to intravascular depletion and diuresis -Plan of care has been now strictly to full comfort care. No further blood work will be checked to follow electrolytes and the patient will be treated symptomatically. -Lasix has been now discontinued.  Hypokalemia -Was repleted; at this moment given plan of just full comfort no further labs will be drawn.  Protein calorie malnutrition, severe: due to acute illness and NPO status -family do not want PEG or any artificial feeding -patient high risk aspiration and has failed swallowing test twice -at this moment patient will be switched to dysphasia 1 for comfort feeding with understanding of potential re-aspiration.  Goals of Care -DNR/DO NOT INTUBATE -Full comfort care -Dysphasia 1 (comfort feeding)   Communication: daughter last updated over phone 3/25 Dispo--full comfort care. Waiting availability out of residential hospice if patient remains stable.   Procedures/Studies: Dg Chest 2 View (if Patient Has Fever And/or Copd)  08/21/2014   CLINICAL DATA:  Coughing and choking episode while drinking water  EXAM: CHEST  2 VIEW  COMPARISON:  05/04/2014  FINDINGS: There is mild enlargement of the pulmonary vasculature and mild interstitial thickening, a significant change from 05/04/2014. This likely represents a mild degree of  congestive heart failure.  There is no alveolar edema. There are no effusions. Heart size is normal and unchanged. Hilar and mediastinal contours are unremarkable and unchanged.  IMPRESSION: Mild CHF with vascular congestion and interstitial fluid. No alveolar edema.   Electronically Signed   By: Ellery Plunk M.D.   On: 08/21/2014 01:19   Dg Chest Port 1 View  08/28/2014   CLINICAL DATA:  Shortness of Breath  EXAM: PORTABLE CHEST - 1 VIEW  COMPARISON:  08/21/2014  FINDINGS: Cardiac shadow is stable. Increased central vascularity with both alveolar and interstitial edema is noted consistent with CHF. Followup films following appropriate therapy recommended. No bony abnormality is seen.  IMPRESSION: Changes consistent with CHF with pulmonary edema.   Electronically Signed   By: Alcide Clever M.D.   On: 08/28/2014 12:09    Subjective: Patient's daughter who is the healthcare power of attorney made with relative care on 08/31/2014 and the decision has been to switch patient to full comfort care. At this moment on examination patient is comfortable, afebrile, unable to follow simple commands and is slightly lethargic. No signs of distress and unable to eat or drink anything.  Objective: Filed Vitals:   08/31/14 1610 08/31/14 1700 08/31/14 2202 09/01/14 0448  BP: 135/56 113/68  111/78  Pulse: 84 105 108 97  Temp:    97.9 F (36.6 C)  TempSrc:    Axillary  Resp: Height:      Weight:      SpO2: 98% 94%  100%    Intake/Output Summary (Last 24 hours) at 09/01/14 1435 Last data filed at 09/01/14 0453  Gross per 24 hour  Intake    230 ml  Output      0 ml  Net    230 ml   Weight change:  Exam:   General:  Pt is comfortable overall. Slightly lethargic and having some difficulties following simple commands  Cardiovascular: IRRR, no rubs, positive murmur; no JVD    Respiratory: fair air movement; diminish BS at bases, No wheezing.  Abdomen: Soft/+BS, non tender, non distended,  no guarding  Extremities: trace edema edema bilaterally, no cyanosis or clubbing appreciated  Data Reviewed: Basic Metabolic Panel:  Recent Labs Lab 08/28/14 1134 08/28/14 1504 08/29/14 0205 08/30/14 0540 08/31/14 0350  NA 146* 142 146* 149* 152*  K 4.9 4.5 3.8 3.1* 3.0*  CL 114* 114* 113* 111 114*  CO2 18* GLUCOSE 195* 217* 159* 186* 163*  BUN 60* 58* 64* 59* 53*  CREATININE 1.96* 1.96* 2.10* 2.44* 2.40*  CALCIUM 9.1 8.3* 8.5 8.6 8.4  MG  --   --   --  2.1  --    Liver Function Tests:  Recent Labs Lab 08/28/14 1134 08/28/14 1504  AST 21 22  ALT 18 17  ALKPHOS 159* 145*  BILITOT 0.4 0.6  PROT 7.7 6.9  ALBUMIN 3.4* 2.9*   CBC:  Recent Labs Lab 08/28/14 1134 08/29/14 0205 08/30/14 0540  WBC 10.9* 11.7* 11.6*  NEUTROABS 9.1*  --   --   HGB 11.4* 9.8* 10.4*  HCT 35.9* 30.7* 32.8*  MCV 89.5 89.0 88.6  PLT 376 330 336   Cardiac Enzymes:  Recent Labs Lab 08/28/14 1459 08/28/14 2025 08/29/14 0205  TROPONINI 4.08* 5.52* 7.10*   CBG:  Recent Labs Lab 08/30/14 1131 08/30/14 1533 08/31/14 0730 08/31/14 1210 08/31/14 1553  GLUCAP 153* 148* 167* 173* 118*    Recent Results (from the past 240 hour(s))  Urine culture     Status: None   Collection Time: 08/28/14 11:51 AM  Result Value Ref Range Status   Specimen Description URINE, CATHETERIZED  Final   Special Requests NONE  Final   Colony Count >=100,000 COLONIES/ML  Final   Culture PROTEUS MIRABILIS  Final   Report Status 08/30/2014 FINAL  Final   Organism ID, Bacteria PROTEUS MIRABILIS  Final      Susceptibility   Proteus mirabilis - MIC*    AMPICILLIN Value in next row Sensitive      <=2 SENSITIVEPerformed at [facility]    CEFAZOLIN Value in next row Sensitive      <=4 SENSITIVEPerformed at [facility]    CEFTRIAXONE Value in next row Sensitive      <=1 SENSITIVEPerformed at [facility]    CIPROFLOXACIN Value in next row Resistant      >=4  RESISTANTPerformed at [employer] Value in next row Intermediate      8 INTERMEDIATEPerformed at [facility]    LEVOFLOXACIN Value in next row Resistant      >=8 RESISTANTPerformed at [facility]    NITROFURANTOIN Value in next row Resistant      128 RESISTANTPerformed at [facility]    TOBRAMYCIN Value in next row Sensitive      2 SENSITIVEPerformed at [facility]    TRIMETH/SULFA Value in next row Resistant      >=320 RESISTANTPerformed at [facility]    PIP/TAZO Value in next row Sensitive      29 SENSITIVEPerformed at [facility]    * PROTEUS MIRABILIS  Blood Culture (routine x 2)     Status: None (Preliminary result)   Collection Time: 08/28/14 12:04 PM  Result Value Ref Range Status   Specimen Description BLOOD LEFT ANTECUBITAL  Final   Special Requests BOTTLES DRAWN AEROBIC AND ANAEROBIC  Final   Culture   Final           BLOOD CULTURE RECEIVED NO GROWTH TO DATE CULTURE WILL BE HELD FOR 5 DAYS BEFORE ISSUING A FINAL NEGATIVE REPORT Performed at [facility]    Report Status PENDING  Incomplete  Blood Culture (routine x 2)     Status: None (Preliminary result)   Collection Time: 08/28/14 12:05 PM  Result Value Ref Range Status   Specimen Description BLOOD RIGHT ANTECUBITAL  Final   Special Requests BOTTLES DRAWN AEROBIC AND ANAEROBIC  Final   Culture   Final           BLOOD CULTURE RECEIVED NO GROWTH TO DATE CULTURE WILL BE HELD FOR 5 DAYS BEFORE ISSUING A FINAL NEGATIVE REPORT Performed at [facility]    Report Status PENDING  Incomplete  MRSA PCR Screening     Status: None   Collection Time: 08/28/14  4:34 PM  Result Value Ref Range Status   MRSA by PCR NEGATIVE NEGATIVE Final    Comment:        The GeneXpert MRSA Assay (FDA approved for NASAL specimens only), is one component of a comprehensive MRSA colonization surveillance program. It is not intended to diagnose  MRSA infection nor to guide or monitor treatment for MRSA infections.      Scheduled Meds: . artificial tears   Both Eyes 3 times per day  . sodium chloride  3 mL Intravenous Q12H   Continuous Infusions:     Vassie Loll, MD  [facility] Pager [phone]  If 7PM-7AM, please contact night-coverage [email or web address] Password [email or web address] 09/01/2014, 2:35 PM   LOS: 4 days

## 2014-09-02 NOTE — Plan of Care (Signed)
Problem: Phase I Progression Outcomes Goal: Voiding-avoid urinary catheter unless indicated Outcome: Completed/Met Date Met:  09/02/14 Incontinence

## 2014-09-02 NOTE — Progress Notes (Signed)
PROGRESS NOTE  [name] ZOX:[id number] DOB: 04/28/1925 DOA: 08/28/2014 PCP: Terald SleeperOBSON,MICHAEL GAVIN, MD  Assessment/Plan: Acute on chronic diastolic CHF -Patient w/o major findings suggesting massive fluid overload. -Has developed intravascular depletion after diuresis. No major improvement in her overall health status -After goals of care meeting decision has been to transition patient to full comfort -At this moment no further diuresis will be provided -Echocardiogram--EF 55-60%, No WMA, mod MR, mild AS -Patient started on as needed by lauded, Ativan and when necessary nebulizer treatment  Pulmonary infiltrates/Acute Respiratory Failure/Aspiration Pneumonia -Comfort oxygen supplementation with [place] as needed 2-3 L -after 4 days of IV antibiotics patient was no longer febrile or demonstrating any elevation of her WBCs. She remains stable and significantly decline in her overall condition reason why she has now been switched to full comfort care. No further antibiotics will be provided.  Proteus mirabilis UTI -Sensitive to Zosyn; patient received 4 days of IV antibiotic without significant improvement  -At this moment she has been transitioned to full comfort and no further antibiotics will be provided.  acute on chronic renal failure CKD stage III -Baseline creatinine 1.0-1.4  -Last creatinine check demonstrated a level of 2.4 -At this moment care has been transitioned to full comfort and no further labs will be checked. -Patient will be treated symptomatically only  Atrial fibrillation with RVR  -Patient care has been transitioned to full comfort and at this moment beta blockers and diltiazem has been discontinue  Elevated troponin/NSTEMI/demand ischemia -per cardiology rec's not a candidate for intervention; continue just medical management -discussed with pt's daughter (POA)-- understand no interventions will be offered  -Plan is for full comfort  care  Dysphagia -Per GOC discussion outcome, patient care has now been transitioned to full comfort. -failed swallow eval twice -family has decline PEG tube -Dysphasia 1 diet with thin liquids for comfort feeding  diabetes mellitus type 2  -Hemoglobin A1c--6.9 -Given transition of care to full comfort, will stop checking CBGs or providing sliding scale insulin treatment.  Dementia -at baseline per daughter she is non-verbal; but able to follow simple commands -Currently slightly lethargic, remains nonverbal but is unable to follow simple commands. -Continue supportive care and comfort for care  Hypernatremia -due to intravascular depletion and diuresis -Plan of care has been now strictly to full comfort care. No further blood work will be checked to follow electrolytes and the patient will be treated symptomatically. -Lasix has been now discontinued.  Hypokalemia -Was repleted; at this moment given plan of just full comfort no further labs will be drawn.  Protein calorie malnutrition, severe: due to acute illness and NPO status -family do not want PEG or any artificial feeding -patient high risk aspiration and has failed swallowing test twice -at this moment patient will be switched to dysphasia 1 for comfort feeding with understanding of potential re-aspiration.  Goals of Care -DNR/DO NOT INTUBATE -Full comfort care -Dysphasia 1 (comfort feeding)  Incontinence/skin integrity -will place foley   Communication: daughter last updated over phone 3/25 Dispo--full comfort care. Waiting availability out of residential hospice if patient remains stable.   Procedures/Studies: Dg Chest 2 View (if Patient Has Fever And/or Copd)  08/21/2014   CLINICAL DATA:  Coughing and choking episode while drinking water  EXAM: CHEST  2 VIEW  COMPARISON:  05/04/2014  FINDINGS: There is mild enlargement of the pulmonary vasculature and mild interstitial thickening, a significant change from  05/04/2014. This likely represents a  mild degree of congestive heart failure. There is no alveolar edema. There are no effusions. Heart size is normal and unchanged. Hilar and mediastinal contours are unremarkable and unchanged.  IMPRESSION: Mild CHF with vascular congestion and interstitial fluid. No alveolar edema.   Electronically Signed   By: Ellery Plunk M.D.   On: 08/21/2014 01:19   Dg Chest Port 1 View  08/28/2014   CLINICAL DATA:  Shortness of Breath  EXAM: PORTABLE CHEST - 1 VIEW  COMPARISON:  08/21/2014  FINDINGS: Cardiac shadow is stable. Increased central vascularity with both alveolar and interstitial edema is noted consistent with CHF. Followup films following appropriate therapy recommended. No bony abnormality is seen.  IMPRESSION: Changes consistent with CHF with pulmonary edema.   Electronically Signed   By: Alcide Clever M.D.   On: 08/28/2014 12:09    Subjective: Patient's overall comfortable, no signs of resp distress. Positive incontinence and increase concern for skin integrity brought up by staff. Not able to eat or drink anything.  Objective: Filed Vitals:   09/01/14 1445 09/01/14 2026 09/02/14 0444 09/02/14 0446  BP: 177/92  155/122 121/86  Pulse: 69 112 91   Temp: 97.8 F (36.6 C)  99.1 F (37.3 C)   TempSrc: Oral  Axillary   Resp: 19  24   Height:      Weight:      SpO2: 100%  89%     Intake/Output Summary (Last 24 hours) at 09/02/14 1155 Last data filed at 09/02/14 1000  Gross per 24 hour  Intake     53 ml  Output      0 ml  Net     53 ml   Weight change:  Exam:   General:  Pt is comfortable overall. Having some incontinence. No fever.  Cardiovascular: IRRR, no rubs, positive murmur; no JVD    Respiratory: fair air movement; diminish BS at bases, No wheezing.  Abdomen: Soft/+BS, non tender, non distended, no guarding  Extremities: trace edema edema bilaterally, no cyanosis or clubbing appreciated  Data Reviewed: Basic Metabolic  Panel:  Recent Labs Lab 08/28/14 1134 08/28/14 1504 08/29/14 0205 08/30/14 0540 08/31/14 0350  NA 146* 142 146* 149* 152*  K 4.9 4.5 3.8 3.1* 3.0*  CL 114* 114* 113* 111 114*  CO2 18* GLUCOSE 195* 217* 159* 186* 163*  BUN 60* 58* 64* 59* 53*  CREATININE 1.96* 1.96* 2.10* 2.44* 2.40*  CALCIUM 9.1 8.3* 8.5 8.6 8.4  MG  --   --   --  2.1  --    Liver Function Tests:  Recent Labs Lab 08/28/14 1134 08/28/14 1504  AST 21 22  ALT 18 17  ALKPHOS 159* 145*  BILITOT 0.4 0.6  PROT 7.7 6.9  ALBUMIN 3.4* 2.9*   CBC:  Recent Labs Lab 08/28/14 1134 08/29/14 0205 08/30/14 0540  WBC 10.9* 11.7* 11.6*  NEUTROABS 9.1*  --   --   HGB 11.4* 9.8* 10.4*  HCT 35.9* 30.7* 32.8*  MCV 89.5 89.0 88.6  PLT 376 330 336   Cardiac Enzymes:  Recent Labs Lab 08/28/14 1459 08/28/14 2025 08/29/14 0205  TROPONINI 4.08* 5.52* 7.10*   CBG:  Recent Labs Lab 08/30/14 1131 08/30/14 1533 08/31/14 0730 08/31/14 1210 08/31/14 1553  GLUCAP 153* 148* 167* 173* 118*    Recent Results (from the past 240 hour(s))  Urine culture     Status: None   Collection Time: 08/28/14 11:51 AM  Result Value Ref Range Status  Specimen Description URINE, CATHETERIZED  Final   Special Requests NONE  Final   Colony Count >=100,000 COLONIES/ML  Final   Culture PROTEUS MIRABILIS  Final   Report Status 08/30/2014 FINAL  Final   Organism ID, Bacteria PROTEUS MIRABILIS  Final      Susceptibility   Proteus mirabilis - MIC*    AMPICILLIN Value in next row Sensitive      <=2 SENSITIVEPerformed at [facility]    CEFAZOLIN Value in next row Sensitive      <=4 SENSITIVEPerformed at [facility]    CEFTRIAXONE Value in next row Sensitive      <=1 SENSITIVEPerformed at [facility]    CIPROFLOXACIN Value in next row Resistant      >=4 RESISTANTPerformed at [facility]    GENTAMICIN Value in next row Intermediate      8 INTERMEDIATEPerformed at [employer]    LEVOFLOXACIN Value in next row Resistant      >=8 RESISTANTPerformed at [facility]    NITROFURANTOIN Value in next row Resistant      128 RESISTANTPerformed at [facility]    TOBRAMYCIN Value in next row Sensitive      2 SENSITIVEPerformed at [facility]    TRIMETH/SULFA Value in next row Resistant      >=320 RESISTANTPerformed at [facility]    PIP/TAZO Value in next row Sensitive      29 SENSITIVEPerformed at [facility]    * PROTEUS MIRABILIS  Blood Culture (routine x 2)     Status: None (Preliminary result)   Collection Time: 08/28/14 12:04 PM  Result Value Ref Range Status   Specimen Description BLOOD LEFT ANTECUBITAL  Final   Special Requests BOTTLES DRAWN AEROBIC AND ANAEROBIC  Final   Culture   Final           BLOOD CULTURE RECEIVED NO GROWTH TO DATE CULTURE WILL BE HELD FOR 5 DAYS BEFORE ISSUING A FINAL NEGATIVE REPORT Performed at [facility]    Report Status PENDING  Incomplete  Blood Culture (routine x 2)     Status: None (Preliminary result)   Collection Time: 08/28/14 12:05 PM  Result Value Ref Range Status   Specimen Description BLOOD RIGHT ANTECUBITAL  Final   Special Requests BOTTLES DRAWN AEROBIC AND ANAEROBIC  Final   Culture   Final           BLOOD CULTURE RECEIVED NO GROWTH TO DATE CULTURE WILL BE HELD FOR 5 DAYS BEFORE ISSUING A FINAL NEGATIVE REPORT Performed at [facility]    Report Status PENDING  Incomplete  MRSA PCR Screening     Status: None   Collection Time: 08/28/14  4:34 PM  Result Value Ref Range Status   MRSA by PCR NEGATIVE NEGATIVE Final    Comment:        The GeneXpert MRSA Assay (FDA approved for NASAL specimens only), is one component of a comprehensive MRSA colonization surveillance program. It is not intended to diagnose MRSA infection nor to guide or monitor treatment for MRSA infections.      Scheduled Meds: . artificial tears   Both Eyes 3  times per day  . sodium chloride  3 mL Intravenous Q12H   Continuous Infusions:     Vassie Loll, MD [facility] Pager [phone]  If 7PM-7AM, please contact night-coverage [email or web address] Password [facility] 09/02/2014, 11:55 AM   LOS: 5 days

## 2014-09-02 NOTE — Plan of Care (Signed)
Problem: Phase I Progression Outcomes Goal: Non-pain symptoms managed Outcome: Completed/Met Date Met:  09/02/14 Itching and restlessness

## 2014-09-03 DIAGNOSIS — N390 Urinary tract infection, site not specified: Secondary | ICD-10-CM | POA: Insufficient documentation

## 2014-09-03 LAB — CULTURE, BLOOD (ROUTINE X 2)
CULTURE: NO GROWTH
Culture: NO GROWTH

## 2014-09-03 MED ORDER — GLYCOPYRROLATE 0.2 MG/ML IJ SOLN: 0.1000 mg | INTRAMUSCULAR | Status: AC | PRN

## 2014-09-03 MED ORDER — POLYVINYL ALCOHOL 1.4 % OP SOLN: 2.0000 [drp] | OPHTHALMIC | Status: AC | PRN

## 2014-09-03 MED ORDER — ACETAMINOPHEN 325 MG PO TABS: 650.0000 mg | ORAL_TABLET | ORAL | Status: AC | PRN

## 2014-09-03 MED ORDER — ONDANSETRON HCL 4 MG/2ML IJ SOLN: 4.0000 mg | Freq: Four times a day (QID) | INTRAMUSCULAR | Status: AC | PRN

## 2014-09-03 MED ORDER — HYDROMORPHONE HCL 1 MG/ML IJ SOLN: 0.5000 mg | INTRAMUSCULAR | Status: AC | PRN

## 2014-09-03 MED ORDER — HALOPERIDOL LACTATE 5 MG/ML IJ SOLN: 1.0000 mg | Freq: Four times a day (QID) | INTRAMUSCULAR | Status: AC | PRN

## 2014-09-03 MED ORDER — ARTIFICIAL TEARS OP OINT: TOPICAL_OINTMENT | Freq: Three times a day (TID) | OPHTHALMIC | Status: AC

## 2014-09-03 MED ORDER — LORAZEPAM 2 MG/ML IJ SOLN: 1.0000 mg | INTRAMUSCULAR | Status: AC | PRN

## 2014-09-03 NOTE — Clinical Social Work Note (Signed)
  CSW confirmed with pt's daughter and [facility] that pt's daughter will meet with Hospice at 12:45 to sign paperwork and CSW will arrange for pt transportation to Hospice shortly after that time  CSW encourage pt's daughter to discuss thoughts and needs related to pt's admittance to hospice facility and provided supportive listening to pt's daughter regarding pt's diagnoses and illness.  CSW will continue to monitor pt needs until discharge  .Elray Bubaegina Anilah Huck, LCSW [facility] Clinical Social Worker - Weekend Coverage cell #: [phone]

## 2014-09-03 NOTE — Discharge Summary (Signed)
Physician Discharge Summary  [name]:[id number] DOB: Nov 19, 1924 DOA: 08/28/2014  PCP: Terald Sleeper, MD  Admit date: 08/28/2014 Discharge date: 09/03/2014  Time spent: 30 minutes  Recommendations for Outpatient Follow-up:  1. Full comfort care 2. Medications to be adjusted/exchanged as needed base on hospice facility availability and patient needs  Discharge Diagnoses:  Active Problems:   Diabetes mellitus type 2 in nonobese   Advanced dementia   Acute on chronic diastolic CHF (congestive heart failure)   Acute on chronic renal failure   Acute respiratory failure   Elevated troponin   Paroxysmal atrial fibrillation   Protein-calorie malnutrition, severe   SOB (shortness of breath) Proteus mirabilis UTI  Discharge Condition: stable and overall comfortable. Foley in place for EOL comfort and skin integrity. Also with peripheral IV as preferred route for meds given dysphagia and inability to follow commands properly.  Diet recommendation: dysphagia 1 (comfort feeding)  Filed Weights   08/29/14 0400 08/30/14 0500 08/31/14 0500  Weight: 59.6 kg (131 lb 6.3 oz) 61.5 kg (135 lb 9.3 oz) 59.9 kg (132 lb 0.9 oz)    History of present illness:  81 year old female with history of dementia, atrial fibrillation, CKD stage III, diabetes mellitus, and hypertension presented from [name] memory care unit with increased work of breathing. At baseline, the patient is only able to speak 1 or 2 words, and she is wheelchair bound. Approximately one week prior to this admission, the patient came to the emergency department for choking episode. She was discharged from the emergency department stable condition. The patient was noted to have increased work of breathing on the morning of this admission. There's been no reports of vomiting, diarrhea, fevers, uncontrolled pain. In the emergency department, workup revealed serum creatinine 1.96, WBC 10.9. EKG showed atrial fibrillation with RVR.  BNP was 1992. Chest x-ray showed pulmonary edema. ABG showed 7.38/30/68/17 on 6 L. The patient was initially thought to be septic and started on vancomycin and cefepime in the emergency department after blood cultures were obtained. She was also noted to have an point-of-care elevated troponin of 1.90. Lactic acid was 1.5.  Hospital Course:  Acute on chronic diastolic CHF -Patient w/o major findings suggesting massive fluid overload. -Has developed intravascular depletion after diuresis. No major improvement in her overall health status -After goals of care meeting decision has been to transition patient to full comfort -At this moment no further diuresis will be provided -Echocardiogram--EF 55-60%, No WMA, mod MR, mild AS -Patient started on as needed dilaudid, Ativan and when necessary nebulizer treatment  Pulmonary infiltrates/Acute Respiratory Failure/Aspiration Pneumonia -Comfort oxygen supplementation with [place] as needed 2-3 L -after 4 days of IV antibiotics patient was no longer febrile or demonstrating any elevation of her WBCs. She remains stable and significantly decline in her overall condition reason why she has now been switched to full comfort care. No further antibiotics will be provided.  Proteus mirabilis UTI -Sensitive to Zosyn; patient received 4 days of IV antibiotic without significant improvement  -At this moment she has been transitioned to full comfort and no further antibiotics will be provided.  acute on chronic renal failure CKD stage III -Baseline creatinine 1.0-1.4  -Last creatinine check demonstrated a level of 2.4 -At this moment care has been transitioned to full comfort and no further labs will be checked. -Patient will be treated symptomatically only  Atrial fibrillation with RVR  -Patient care has been transitioned to full comfort and at this moment beta blockers and diltiazem has been  discontinue  Elevated troponin/NSTEMI/demand ischemia -per  cardiology rec's not a candidate for intervention; continue just medical management -discussed with pt's daughter (POA)-- understand no interventions will be offered  -Plan is for full comfort care  Dysphagia -Per GOC discussion outcome, patient care has now been transitioned to full comfort. -failed swallow eval twice -family has decline PEG tube -Dysphasia 1 diet with thin liquids for comfort feeding  diabetes mellitus type 2  -Hemoglobin A1c--6.9 -Given transition of care to full comfort, will stop checking CBGs or providing sliding scale insulin treatment.  Dementia -at baseline per daughter she is non-verbal; but able to follow simple commands -Currently slightly lethargic, remains nonverbal but is unable to follow simple commands. -Continue supportive care and comfort for care  Hypernatremia -due to intravascular depletion and diuresis -Plan of care has been now strictly to full comfort care. No further blood work will be checked to follow electrolytes and the patient will be treated symptomatically. -Lasix has been now discontinued.  Hypokalemia -Was repleted; at this moment given plan of just full comfort no further labs will be drawn.  Protein calorie malnutrition, severe: due to acute illness and NPO status -family do not want PEG or any artificial feeding -patient high risk aspiration and has failed swallowing test twice -at this moment patient will be switched to dysphasia 1 for comfort feeding with understanding of potential re-aspiration. -plan is for full comfort care  Goals of Care -DNR/DO NOT INTUBATE -Full comfort care -Dysphasia 1 (comfort feeding)  Incontinence/skin integrity -will place foley  Procedures:  See below for x-ray reports   Consultations:  Palliative Care  Discharge Exam: Filed Vitals:   09/03/14 0420  BP: 156/80  Pulse: 90  Temp: 97.5 F (36.4 C)  Resp: 20    General: Pt is comfortable overall. Slight episodes of agitation  overnight, but well controlled with ativan and dilaudid. No fever. VS stable  Cardiovascular: IRRR, no rubs, positive murmur; no JVD   Respiratory: fair air movement; diminish BS at bases, No wheezing.  Abdomen: Soft/+BS, non tender, non distended, no guarding  Extremities: trace edema edema bilaterally, no cyanosis or clubbing appreciated  Skin: with slight erythema from itching and prior episodes of incontinence. Now with foley in place  Discharge Instructions   Discharge Instructions    Discharge instructions    Complete by:  As directed   Dysphagia 1 (comfort feeding) Patient with peripheral IV (as unable to swallow meds properly (current route for meds) Foley in place for skin integrity and comfort Plan is for full comfort care.          Current Discharge Medication List    START taking these medications   Details  artificial tears (LACRILUBE) OINT ophthalmic ointment Place into both eyes every 8 (eight) hours. Refills: 0    glycopyrrolate (ROBINUL) 0.2 MG/ML injection Inject 0.5 mLs (0.1 mg total) into the vein every 4 (four) hours as needed (secretions). Qty: 1 mL    haloperidol lactate (HALDOL) 5 MG/ML injection Inject 0.2 mLs (1 mg total) into the vein every 6 (six) hours as needed (terminal delirium, severe agitation). Qty: 1 mL    HYDROmorphone (DILAUDID) 1 MG/ML injection Inject 0.5 mLs (0.5 mg total) into the vein every 2 (two) hours as needed for moderate pain or severe pain (Distress, Agitation, Signs of discomfort). Qty: 1 mL, Refills: 0    LORazepam (ATIVAN) 2 MG/ML injection Inject 0.5 mLs (1 mg total) into the vein every 4 (four) hours as needed for anxiety,  seizure or sedation (agitation). Qty: 1 mL, Refills: 0    ondansetron (ZOFRAN) 4 MG/2ML SOLN injection Inject 2 mLs (4 mg total) into the vein every 6 (six) hours as needed for nausea. Qty: 2 mL, Refills: 0    polyvinyl alcohol (LIQUIFILM TEARS) 1.4 % ophthalmic solution Place 2 drops into both  eyes as needed for dry eyes. Qty: 15 mL, Refills: 0      CONTINUE these medications which have CHANGED   Details  acetaminophen (TYLENOL) 325 MG tablet Take 2 tablets (650 mg total) by mouth every 4 (four) hours as needed for headache or mild pain.      CONTINUE these medications which have NOT CHANGED   Details  diphenhydrAMINE (BANOPHEN) 25 MG tablet Take 12.5 mg by mouth every 8 (eight) hours as needed (Pruritis).    ipratropium-albuterol (DUONEB) 0.5-2.5 (3) MG/3ML SOLN Take 3 mLs by nebulization every 6 (six) hours as needed. SOB/Wheezing       STOP taking these medications     amLODipine (NORVASC) 10 MG tablet      aspirin 81 MG chewable tablet      Calcium 500 MG tablet      cetirizine (ZYRTEC) 10 MG chewable tablet      Cholecalciferol (VITAMIN D-3) 1000 UNITS CAPS      docusate sodium (COLACE) 100 MG capsule      glipiZIDE (GLUCOTROL XL) 2.5 MG 24 hr tablet      HYDROcodone-acetaminophen (NORCO/VICODIN) 5-325 MG per tablet      LORazepam (ATIVAN) 0.5 MG tablet      metoprolol (LOPRESSOR) 50 MG tablet      potassium chloride (MICRO-K) 10 MEQ CR capsule      propafenone (RYTHMOL) 225 MG tablet      ranitidine (ZANTAC) 75 MG tablet        Allergies  Allergen Reactions  . Contrast Media [Iodinated Diagnostic Agents] Other (See Comments)    MAR  . Statins   . Sulfa Antibiotics Hives  . Vesicare [Solifenacin] Other (See Comments)    MAR      The results of significant diagnostics from this hospitalization (including imaging, microbiology, ancillary and laboratory) are listed below for reference.    Significant Diagnostic Studies: Dg Chest 2 View (if Patient Has Fever And/or Copd)  08/21/2014   CLINICAL DATA:  Coughing and choking episode while drinking water  EXAM: CHEST  2 VIEW  COMPARISON:  05/04/2014  FINDINGS: There is mild enlargement of the pulmonary vasculature and mild interstitial thickening, a significant change from 05/04/2014. This likely  represents a mild degree of congestive heart failure. There is no alveolar edema. There are no effusions. Heart size is normal and unchanged. Hilar and mediastinal contours are unremarkable and unchanged.  IMPRESSION: Mild CHF with vascular congestion and interstitial fluid. No alveolar edema.   Electronically Signed   By: Ellery Plunk M.D.   On: 08/21/2014 01:19   Dg Chest Port 1 View  08/28/2014   CLINICAL DATA:  Shortness of Breath  EXAM: PORTABLE CHEST - 1 VIEW  COMPARISON:  08/21/2014  FINDINGS: Cardiac shadow is stable. Increased central vascularity with both alveolar and interstitial edema is noted consistent with CHF. Followup films following appropriate therapy recommended. No bony abnormality is seen.  IMPRESSION: Changes consistent with CHF with pulmonary edema.   Electronically Signed   By: Alcide Clever M.D.   On: 08/28/2014 12:09    Microbiology: Recent Results (from the past 240 hour(s))  Urine culture  Status: None   Collection Time: 08/28/14 11:51 AM  Result Value Ref Range Status   Specimen Description URINE, CATHETERIZED  Final   Special Requests NONE  Final   Colony Count >=100,000 COLONIES/ML  Final   Culture PROTEUS MIRABILIS  Final   Report Status 08/30/2014 FINAL  Final   Organism ID, Bacteria PROTEUS MIRABILIS  Final      Susceptibility   Proteus mirabilis - MIC*    AMPICILLIN Value in next row Sensitive      <=2 SENSITIVEPerformed at [facility]    CEFAZOLIN Value in next row Sensitive      <=4 SENSITIVEPerformed at [facility]    CEFTRIAXONE Value in next row Sensitive      <=1 SENSITIVEPerformed at [facility]    CIPROFLOXACIN Value in next row Resistant      >=4 RESISTANTPerformed at [facility]    GENTAMICIN Value in next row Intermediate      8 INTERMEDIATEPerformed at [facility]    LEVOFLOXACIN Value in next row Resistant      >=8 RESISTANTPerformed at [facility]    NITROFURANTOIN Value in  next row Resistant      128 RESISTANTPerformed at [facility]    TOBRAMYCIN Value in next row Sensitive      2 SENSITIVEPerformed at [facility]    TRIMETH/SULFA Value in next row Resistant      >=320 RESISTANTPerformed at [facility]    PIP/TAZO Value in next row Sensitive      29 SENSITIVEPerformed at [facility]    * PROTEUS MIRABILIS  Blood Culture (routine x 2)     Status: None   Collection Time: 08/28/14 12:04 PM  Result Value Ref Range Status   Specimen Description BLOOD LEFT ANTECUBITAL  Final   Special Requests BOTTLES DRAWN AEROBIC AND ANAEROBIC 5ML  Final   Culture   Final    NO GROWTH 5 DAYS Performed at [facility]    Report Status 09/03/2014 FINAL  Final  Blood Culture (routine x 2)     Status: None   Collection Time: 08/28/14 12:05 PM  Result Value Ref Range Status   Specimen Description BLOOD RIGHT ANTECUBITAL  Final   Special Requests BOTTLES DRAWN AEROBIC AND ANAEROBIC 5ML  Final   Culture   Final    NO GROWTH 5 DAYS Performed at [facility]    Report Status 09/03/2014 FINAL  Final  MRSA PCR Screening     Status: None   Collection Time: 08/28/14  4:34 PM  Result Value Ref Range Status   MRSA by PCR NEGATIVE NEGATIVE Final    Comment:        The GeneXpert MRSA Assay (FDA approved for NASAL specimens only), is one component of a comprehensive MRSA colonization surveillance program. It is not intended to diagnose MRSA infection nor to guide or monitor treatment for MRSA infections.      Labs: Basic Metabolic Panel:  Recent Labs Lab 08/28/14 1134 08/28/14 1504 08/29/14 0205 08/30/14 0540 08/31/14 0350  NA 146* 142 146* 149* 152*  K 4.9 4.5 3.8 3.1* 3.0*  CL 114* 114* 113* 111 114*  CO2 18* 19 19 20 23   GLUCOSE 195* 217* 159* 186* 163*  BUN 60* 58* 64* 59* 53*  CREATININE 1.96* 1.96* 2.10* 2.44* 2.40*  CALCIUM 9.1 8.3* 8.5 8.6 8.4  MG  --   --   --  2.1  --  Liver Function  Tests:  Recent Labs Lab 08/28/14 1134 08/28/14 1504  AST 21 22  ALT 18 17  ALKPHOS 159* 145*  BILITOT 0.4 0.6  PROT 7.7 6.9  ALBUMIN 3.4* 2.9*   CBC:  Recent Labs Lab 08/28/14 1134 08/29/14 0205 08/30/14 0540  WBC 10.9* 11.7* 11.6*  NEUTROABS 9.1*  --   --   HGB 11.4* 9.8* 10.4*  HCT 35.9* 30.7* 32.8*  MCV 89.5 89.0 88.6  PLT 376 330 336   Cardiac Enzymes:  Recent Labs Lab 08/28/14 1459 08/28/14 2025 08/29/14 0205  TROPONINI 4.08* 5.52* 7.10*   BNP: BNP (last 3 results)  Recent Labs  08/28/14 1135  BNP 1992.3*   CBG:  Recent Labs Lab 08/30/14 1131 08/30/14 1533 08/31/14 0730 08/31/14 1210 08/31/14 1553  GLUCAP 153* 148* 167* 173* 118*    Signed:  Vassie Loll  [facility] 09/03/2014, 9:08 AM

## 2014-09-03 NOTE — Clinical Social Work Note (Signed)
[facility] to say they had a [facility] for pt   CSW called and spoke with pt's daughter to let her know and provide information and phone number for [facility]  CSW spoke with MD who provided discharge summary and CSW faxed summary to [phone]   CSW will follow up with a phone call back to Hospice and pt's daughter to facilitate pt discharge this morning  .Elray Bubaegina Daishaun Ayre, LCSW [facility] Clinical Social Worker - Weekend Coverage cell #: [phone]

## 2014-09-03 NOTE — Clinical Social Work Placement (Signed)
Clinical Social Work Department CLINICAL SOCIAL WORK PLACEMENT NOTE 09/03/2014  Patient:  [name],[name]  Account Number:  [id number] Admit date:  08/28/2014  Clinical Social Worker:  Elray Bubaegina Cherryl Babin, CLINICAL SOCIAL WORKER  Date/time:  09/03/2014 01:06 PM  Clinical Social Work is seeking post-discharge placement for this patient at the following level of care:   SKILLED NURSING   (*CSW will update this form in Epic as items are completed)     Patient/family provided with [facility] Department of Clinical Social Work's list of facilities offering this level of care within the geographic area requested by the patient (or if unable, by the patient's family).    Patient/family informed of their freedom to choose among providers that offer the needed level of care, that participate in Medicare, Medicaid or managed care program needed by the patient, have an available bed and are willing to accept the patient.    Patient/family informed of [facility]' ownership interest in [facility], as well as of the fact that they are under no obligation to receive care at this facility.  PASARR submitted to EDS on  PASARR number received on   FL2 transmitted to all facilities in geographic area requested by pt/family on   FL2 transmitted to all facilities within larger geographic area on   Patient informed that his/her managed care company has contracts with or will negotiate with  certain facilities, including the following:     Patient/family informed of bed offers received:   [facility] Physician recommends and patient chooses bed at    [facility] on  09/03/2014 Patient to be transferred to facility by ambulance Patient and family notified of transfer on 09/03/2014 Name of family member notified:  Sharon/daughter  The following physician request were entered in Epic:   Additional  Comments:  .Elray Bubaegina Juanya Villavicencio, LCSW [facility] Clinical Social Worker - Weekend Coverage cell #: [phone]

## 2014-09-06 ENCOUNTER — Telehealth: Payer: Self-pay

## 2014-09-06 NOTE — Telephone Encounter (Signed)
Cindy from Hospice called to inform Dr.Robson patient deceased as of yesterday

## 2014-09-08 DEATH — deceased
